# Patient Record
Sex: Male | Born: 1940 | Race: White | Hispanic: No | Smoking: Former smoker
Health system: Southern US, Community
[De-identification: ages and names within clinical notes are randomized; demographics above are authoritative.]

## PROBLEM LIST (undated history)

## (undated) ENCOUNTER — Emergency Department (HOSPITAL_BASED_OUTPATIENT_CLINIC_OR_DEPARTMENT_OTHER)

## (undated) DIAGNOSIS — R9389 Abnormal findings on diagnostic imaging of other specified body structures: Secondary | ICD-10-CM

## (undated) DIAGNOSIS — N289 Disorder of kidney and ureter, unspecified: Secondary | ICD-10-CM

## (undated) DIAGNOSIS — I10 Essential (primary) hypertension: Secondary | ICD-10-CM

## (undated) HISTORY — PX: OTHER SURGICAL HISTORY: SHX169

## (undated) HISTORY — DX: Abnormal findings on diagnostic imaging of other specified body structures: R93.89

---

## 2015-05-06 DIAGNOSIS — N1831 Chronic kidney disease, stage 3a: Secondary | ICD-10-CM | POA: Diagnosis present

## 2015-05-06 DIAGNOSIS — E782 Mixed hyperlipidemia: Secondary | ICD-10-CM | POA: Diagnosis present

## 2015-05-06 DIAGNOSIS — M1A9XX0 Chronic gout, unspecified, without tophus (tophi): Secondary | ICD-10-CM

## 2015-05-06 DIAGNOSIS — F5101 Primary insomnia: Secondary | ICD-10-CM | POA: Insufficient documentation

## 2015-05-06 DIAGNOSIS — I129 Hypertensive chronic kidney disease with stage 1 through stage 4 chronic kidney disease, or unspecified chronic kidney disease: Secondary | ICD-10-CM | POA: Diagnosis present

## 2015-05-06 HISTORY — DX: Chronic kidney disease, stage 3a: N18.31

## 2015-05-06 HISTORY — DX: Mixed hyperlipidemia: E78.2

## 2015-05-06 HISTORY — DX: Chronic gout, unspecified, without tophus (tophi): M1A.9XX0

## 2015-05-06 HISTORY — DX: Primary insomnia: F51.01

## 2015-05-10 DIAGNOSIS — E6609 Other obesity due to excess calories: Secondary | ICD-10-CM | POA: Insufficient documentation

## 2015-05-10 DIAGNOSIS — Z6831 Body mass index (BMI) 31.0-31.9, adult: Secondary | ICD-10-CM

## 2015-05-10 HISTORY — DX: Other obesity due to excess calories: Z68.31

## 2016-11-12 DIAGNOSIS — D508 Other iron deficiency anemias: Secondary | ICD-10-CM | POA: Insufficient documentation

## 2016-11-12 DIAGNOSIS — R7301 Impaired fasting glucose: Secondary | ICD-10-CM | POA: Insufficient documentation

## 2016-11-12 HISTORY — DX: Impaired fasting glucose: R73.01

## 2016-11-12 HISTORY — DX: Other iron deficiency anemias: D50.8

## 2019-05-14 DIAGNOSIS — M19011 Primary osteoarthritis, right shoulder: Secondary | ICD-10-CM | POA: Insufficient documentation

## 2019-05-14 DIAGNOSIS — M7541 Impingement syndrome of right shoulder: Secondary | ICD-10-CM | POA: Insufficient documentation

## 2019-05-14 HISTORY — DX: Impingement syndrome of right shoulder: M75.41

## 2019-05-14 HISTORY — DX: Primary osteoarthritis, right shoulder: M19.011

## 2019-05-29 DIAGNOSIS — M7591 Shoulder lesion, unspecified, right shoulder: Secondary | ICD-10-CM | POA: Insufficient documentation

## 2019-05-29 DIAGNOSIS — M67911 Unspecified disorder of synovium and tendon, right shoulder: Secondary | ICD-10-CM | POA: Insufficient documentation

## 2019-05-29 HISTORY — DX: Unspecified disorder of synovium and tendon, right shoulder: M67.911

## 2019-05-29 HISTORY — DX: Shoulder lesion, unspecified, right shoulder: M75.91

## 2020-03-11 ENCOUNTER — Emergency Department (HOSPITAL_COMMUNITY): Payer: Medicare HMO

## 2020-03-11 ENCOUNTER — Encounter (HOSPITAL_COMMUNITY): Payer: Self-pay

## 2020-03-11 ENCOUNTER — Emergency Department (HOSPITAL_COMMUNITY)
Admission: EM | Admit: 2020-03-11 | Discharge: 2020-03-11 | Disposition: A | Discharge status: Home / Self Care | Payer: Medicare HMO | Attending: Emergency Medicine | Admitting: Emergency Medicine

## 2020-03-11 ENCOUNTER — Other Ambulatory Visit: Payer: Self-pay

## 2020-03-11 DIAGNOSIS — K8051 Calculus of bile duct without cholangitis or cholecystitis with obstruction: Secondary | ICD-10-CM | POA: Insufficient documentation

## 2020-03-11 DIAGNOSIS — R1011 Right upper quadrant pain: Secondary | ICD-10-CM | POA: Insufficient documentation

## 2020-03-11 DIAGNOSIS — I10 Essential (primary) hypertension: Secondary | ICD-10-CM | POA: Diagnosis not present

## 2020-03-11 DIAGNOSIS — N289 Disorder of kidney and ureter, unspecified: Secondary | ICD-10-CM | POA: Diagnosis not present

## 2020-03-11 DIAGNOSIS — Z87891 Personal history of nicotine dependence: Secondary | ICD-10-CM | POA: Insufficient documentation

## 2020-03-11 DIAGNOSIS — K8080 Other cholelithiasis without obstruction: Secondary | ICD-10-CM | POA: Insufficient documentation

## 2020-03-11 DIAGNOSIS — K8050 Calculus of bile duct without cholangitis or cholecystitis without obstruction: Secondary | ICD-10-CM

## 2020-03-11 DIAGNOSIS — Z79899 Other long term (current) drug therapy: Secondary | ICD-10-CM | POA: Diagnosis not present

## 2020-03-11 DIAGNOSIS — R109 Unspecified abdominal pain: Secondary | ICD-10-CM

## 2020-03-11 HISTORY — DX: Disorder of kidney and ureter, unspecified: N28.9

## 2020-03-11 HISTORY — DX: Essential (primary) hypertension: I10

## 2020-03-11 LAB — COMPREHENSIVE METABOLIC PANEL
ALT: 23 U/L (ref 0–44)
AST: 40 U/L (ref 15–41)
Albumin: 4.5 g/dL (ref 3.5–5.0)
Alkaline Phosphatase: 61 U/L (ref 38–126)
Anion gap: 12 (ref 5–15)
BUN: 24 mg/dL — ABNORMAL HIGH (ref 8–23)
CO2: 23 mmol/L (ref 22–32)
Calcium: 9.5 mg/dL (ref 8.9–10.3)
Chloride: 94 mmol/L — ABNORMAL LOW (ref 98–111)
Creatinine, Ser: 1.23 mg/dL (ref 0.61–1.24)
GFR, Estimated: >60 mL/min (ref >60)
Glucose, Bld: 171 mg/dL — ABNORMAL HIGH (ref 70–99)
Potassium: 4.4 mmol/L (ref 3.5–5.1)
Sodium: 129 mmol/L — ABNORMAL LOW (ref 135–145)
Total Bilirubin: 1.1 mg/dL (ref 0.3–1.2)
Total Protein: 7 g/dL (ref 6.5–8.1)

## 2020-03-11 LAB — CBC
HCT: 35.8 % — ABNORMAL LOW (ref 39.0–52.0)
Hemoglobin: 12.6 g/dL — ABNORMAL LOW (ref 13.0–17.0)
MCH: 33.8 pg (ref 26.0–34.0)
MCHC: 35.2 g/dL (ref 30.0–36.0)
MCV: 96 fL (ref 80.0–100.0)
Platelets: 221 10*3/uL (ref 150–400)
RBC: 3.73 MIL/uL — ABNORMAL LOW (ref 4.22–5.81)
RDW: 13.2 % (ref 11.5–15.5)
WBC: 15.1 10*3/uL — ABNORMAL HIGH (ref 4.0–10.5)
nRBC: 0 % (ref 0.0–0.2)

## 2020-03-11 LAB — LIPASE, BLOOD: Lipase: 28 U/L (ref 11–51)

## 2020-03-11 MED ORDER — HYDROCODONE-ACETAMINOPHEN 5-325 MG PO TABS
1.0000 | ORAL_TABLET | Freq: Once | ORAL | Status: AC
  Administered 2020-03-11: 1 via ORAL
  Filled 2020-03-11: qty 1

## 2020-03-11 MED ORDER — MORPHINE SULFATE (PF) 4 MG/ML IV SOLN
4.0000 mg | Freq: Once | INTRAVENOUS | Status: AC
  Administered 2020-03-11: 4 mg via INTRAVENOUS
  Filled 2020-03-11: qty 1

## 2020-03-11 MED ORDER — ONDANSETRON HCL 4 MG/2ML IJ SOLN
4.0000 mg | Freq: Once | INTRAMUSCULAR | Status: AC
  Administered 2020-03-11: 4 mg via INTRAVENOUS
  Filled 2020-03-11: qty 2

## 2020-03-11 MED ORDER — IOHEXOL 300 MG/ML  SOLN
100.0000 mL | Freq: Once | INTRAMUSCULAR | Status: AC | PRN
  Administered 2020-03-11: 100 mL via INTRAVENOUS

## 2020-03-11 MED ORDER — FENTANYL CITRATE (PF) 100 MCG/2ML IJ SOLN
25.0000 ug | Freq: Once | INTRAMUSCULAR | Status: AC
  Administered 2020-03-11: 25 ug via INTRAVENOUS
  Filled 2020-03-11: qty 2

## 2020-03-11 MED ORDER — HYDROCODONE-ACETAMINOPHEN 5-325 MG PO TABS
1.0000 | ORAL_TABLET | Freq: Four times a day (QID) | ORAL | 0 refills | Status: DC | PRN
Start: 2020-03-11 — End: 2021-06-01

## 2020-03-11 MED ORDER — SODIUM CHLORIDE 0.9 % IV BOLUS
1000.0000 mL | Freq: Once | INTRAVENOUS | Status: AC
  Administered 2020-03-11: 1000 mL via INTRAVENOUS

## 2020-03-11 MED ORDER — ONDANSETRON 4 MG PO TBDP: 4.0000 mg | ORAL_TABLET | Freq: Three times a day (TID) | ORAL | 0 refills | Status: DC | PRN

## 2020-03-11 NOTE — ED Notes (Signed)
Patient off floor for Imaging.

## 2020-03-11 NOTE — ED Notes (Signed)
Patient back in room

## 2020-03-11 NOTE — ED Triage Notes (Signed)
Patient c/o right mid abdominal pain, emesis, and bloating since yesterday.

## 2020-03-11 NOTE — ED Notes (Signed)
Fall Risk Bundle: Fall Risk sign outside door Fall Risk armband Bed alarm on Yellow socks

## 2020-03-11 NOTE — ED Notes (Signed)
PO challenge complete, pt tolerated well.

## 2020-03-11 NOTE — ED Notes (Signed)
External condom catheter placed on patient  

## 2020-03-11 NOTE — ED Provider Notes (Signed)
[facility]-EMERGENCY DEPT Provider Note   CSN: [id number] Arrival date & time: 03/11/20  8768     History Chief Complaint  Patient presents with  . Abdominal Pain    Allen Torres is a 79 y.o. male past medical history of hypertension, renal disorder who presents for evaluation of right upper quadrant abdominal pain that began last night.  Reports that he ate dinner normally last night and felt okay.  He reports shortly afterwards, he started having some pain in his right upper quadrant.  He states that since then, has been persistent.  He states he had several episodes of nausea/vomiting and has not been able to tolerate any p.o. since onset of symptoms.  He had a small bowel movement last night.  None since.  He does not think he has been passing flatus.  He states that the pain was progressively getting worse, prompting ED visit.  He feels like his abdomen is bloated.  He has never had any previous abdominal surgeries.  Denies any history of fevers.  He states he does not have any chest pain, difficulty breathing, difficulty urinating.  The history is provided by the patient.       Past Medical History:  Diagnosis Date  . Hypertension   . Renal disorder    Patient states, "chronic kidney disease"    There are no problems to display for this patient.   Past Surgical History:  Procedure Laterality Date  . facial cystectomy         History reviewed. No pertinent family history.  Social History   Tobacco Use  . Smoking status: Former Games developer  . Smokeless tobacco: Never Used  Vaping Use  . Vaping Use: Never used  Substance Use Topics  . Alcohol use: Never  . Drug use: Never    Home Medications Prior to Admission medications   Medication Sig Start Date End Date Taking? Authorizing Provider  allopurinol (ZYLOPRIM) 100 MG tablet Take 200 mg by mouth daily. 02/09/20  Yes [provider]  atenolol (TENORMIN) 100 MG tablet Take 50-100 mg  by mouth 2 (two) times daily. Take 1 tablet (100 mg) in the morning and Take 1/2 tablet (50 mg) at bedtime 02/09/20  Yes [provider]  cholecalciferol (VITAMIN D3) 25 MCG (1000 UNIT) tablet Take 1,000 Units by mouth daily.   Yes [provider]  doxazosin (CARDURA) 2 MG tablet Take 2 mg by mouth every evening.  02/09/20  Yes [provider]  fluticasone (FLONASE) 50 MCG/ACT nasal spray Place 1 spray into both nostrils daily as needed for allergies.  02/09/20  Yes [provider]  hydrochlorothiazide (MICROZIDE) 12.5 MG capsule Take 12.5 mg by mouth daily. 02/09/20  Yes [provider]  lisinopril (ZESTRIL) 40 MG tablet Take 40 mg by mouth daily.  02/09/20  Yes [provider]  vitamin B-12 (CYANOCOBALAMIN) 100 MCG tablet Take 100 mcg by mouth daily.   Yes [provider]  HYDROcodone-acetaminophen (NORCO/VICODIN) 5-325 MG tablet Take 1-2 tablets by mouth every 6 (six) hours as needed. 03/11/20   Maxwell Caul, PA-C  ondansetron (ZOFRAN ODT) 4 MG disintegrating tablet Take 1 tablet (4 mg total) by mouth every 8 (eight) hours as needed for nausea or vomiting. 03/11/20   Maxwell Caul, PA-C    Allergies    Indocin [indomethacin]  Review of Systems   Review of Systems  Constitutional: Negative for fever.  Respiratory: Negative for cough and shortness of breath.  Cardiovascular: Negative for chest pain.  Gastrointestinal: Positive for abdominal pain, nausea and vomiting.  Genitourinary: Negative for dysuria and hematuria.  Neurological: Negative for headaches.  All other systems reviewed and are negative.   Physical Exam Updated Vital Signs BP (!) 146/71 (BP Location: Right Arm)   Pulse 70   Temp 98.4 F (36.9 C) (Oral)   Resp (!) 26   Ht 5\' 9"  (1.753 m)   Wt 100.2 kg   SpO2 94%   BMI 32.64 kg/m   Physical Exam Vitals and nursing note reviewed.  Constitutional:      Appearance: Normal appearance. He is  well-developed.     Comments: Appears uncomfortable but NAD  HENT:     Head: Normocephalic and atraumatic.  Eyes:     General: Lids are normal.     Conjunctiva/sclera: Conjunctivae normal.     Pupils: Pupils are equal, round, and reactive to light.  Cardiovascular:     Rate and Rhythm: Normal rate and regular rhythm.     Pulses: Normal pulses.     Heart sounds: Normal heart sounds. No murmur heard.  No friction rub. No gallop.   Pulmonary:     Effort: Pulmonary effort is normal.     Breath sounds: Normal breath sounds.     Comments: Lungs clear to auscultation bilaterally.  Symmetric chest rise.  No wheezing, rales, rhonchi. Abdominal:     Palpations: Abdomen is soft. Abdomen is not rigid.     Tenderness: There is abdominal tenderness in the right upper quadrant. There is no guarding. Positive signs include Murphy's sign.     Comments: Protuberant abdomen.  He has tenderness palpation of the right upper quadrant.  Positive Murphy sign.  No rigidity, guarding.  Musculoskeletal:        General: Normal range of motion.     Cervical back: Full passive range of motion without pain.  Skin:    General: Skin is warm and dry.     Capillary Refill: Capillary refill takes less than 2 seconds.  Neurological:     Mental Status: He is alert and oriented to person, place, and time.  Psychiatric:        Speech: Speech normal.     ED Results / Procedures / Treatments   Labs (all labs ordered are listed, but only abnormal results are displayed) Labs Reviewed  COMPREHENSIVE METABOLIC PANEL - Abnormal; Notable for the following components:      Result Value   Sodium 129 (*)    Chloride 94 (*)    Glucose, Bld 171 (*)    BUN 24 (*)    All other components within normal limits  CBC - Abnormal; Notable for the following components:   WBC 15.1 (*)    RBC 3.73 (*)    Hemoglobin 12.6 (*)    HCT 35.8 (*)    All other components within normal limits  LIPASE, BLOOD  URINALYSIS, ROUTINE W REFLEX  MICROSCOPIC    EKG None  Radiology CT ABDOMEN PELVIS W CONTRAST  Result Date: 03/11/2020 CLINICAL DATA:  Right-sided mid abdominal pain with emesis and bloating since yesterday. EXAM: CT ABDOMEN AND PELVIS WITH CONTRAST TECHNIQUE: Multidetector CT imaging of the abdomen and pelvis was performed using the standard protocol following bolus administration of intravenous contrast. CONTRAST:  100mL OMNIPAQUE IOHEXOL 300 MG/ML  SOLN COMPARISON:  Right upper quadrant ultrasound from same day. FINDINGS: Lower chest: Circumferential wall thickening of the distal esophagus. Subsegmental atelectasis at the lung bases. Hepatobiliary: No  focal liver abnormality. Tiny layering stones and/or sludge in the gallbladder. No gallbladder wall thickening or biliary dilatation. Pancreas: Unremarkable. No pancreatic ductal dilatation or surrounding inflammatory changes. Spleen: Normal in size without focal abnormality. Adrenals/Urinary Tract: Indeterminate 1.1 cm right adrenal nodule. The left adrenal gland is unremarkable. Bilateral renal simple cysts. No renal calculi or hydronephrosis. The bladder is unremarkable. Stomach/Bowel: Small hiatal hernia. Mild wall thickening of the gastric antrum and first portion of the duodenum (series 2, image 27). No obstruction. Sigmoid colonic diverticulosis. Normal appendix. Vascular/Lymphatic: Aortic atherosclerosis. No enlarged abdominal or pelvic lymph nodes. Reproductive: Prostate is normal in size with central calcifications. Other: No free fluid or pneumoperitoneum. Moderate left and small right fat containing inguinal hernias. Small fat containing umbilical hernia. Musculoskeletal: No acute or significant osseous findings. IMPRESSION: 1. Mild wall thickening of the gastric antrum and first portion of the duodenum may reflect gastroduodenitis. 2. Circumferential wall thickening of the distal esophagus amy reflect esophagitis. 3. Cholelithiasis and/or sludge without evidence of acute  cholecystitis. 4. Indeterminate 1.1 cm right adrenal nodule. 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 Consider 12 month follow-up adrenal protocol CT with and without contrast. This recommendation follows ACR consensus guidelines: Management of Incidental Adrenal Masses: A White Paper of the ACR Incidental Findings Committee. J Am Coll Radiol 2017;14:1038-1044. 5. Aortic Atherosclerosis (ICD10-I70.0). Electronically Signed   By: Obie Dredge M.D.   On: 03/11/2020 15:14   US Abdomen Limited RUQ (LIVER/GB)  Result Date: 03/11/2020 CLINICAL DATA:  Abdominal pain EXAM: ULTRASOUND ABDOMEN LIMITED RIGHT UPPER QUADRANT COMPARISON:  None. FINDINGS: Gallbladder: Multiple mobile layering tiny stones or sludge within the gallbladder lumen. No pericholecystic edema or wall thickening visualized. No sonographic Murphy sign noted by sonographer. Common bile duct: Diameter: 4 mm. Liver: Limited evaluation as the left hepatic lobe was obscured by overlying bowel gas. Within this limitation, no focal lesion identified. Within normal limits in parenchymal echogenicity. Portal vein is patent on color Doppler imaging with normal direction of blood flow towards the liver. Other: None. IMPRESSION: 1. Cholelithiasis without sonographic evidence of acute cholecystitis. 2. Evaluation the liver is limited by overlying bowel gas. No definite sonographic abnormality. Electronically Signed   By: Duanne Guess D.O.   On: 03/11/2020 13:09    Procedures Procedures (including critical care time)  Medications Ordered in ED Medications  sodium chloride 0.9 % bolus 1,000 mL (0 mLs Intravenous Stopped 03/11/20 1654)  ondansetron (ZOFRAN) injection 4 mg (4 mg Intravenous Given 03/11/20 1210)  morphine 4 MG/ML injection 4 mg (4 mg Intravenous Given 03/11/20 1212)  fentaNYL (SUBLIMAZE) injection 25 mcg (25 mcg Intravenous Given 03/11/20 1508)  ondansetron (ZOFRAN) injection 4 mg (4 mg Intravenous Given  03/11/20 1508)  iohexol (OMNIPAQUE) 300 MG/ML solution 100 mL (100 mLs Intravenous Contrast Given 03/11/20 1436)  HYDROcodone-acetaminophen (NORCO/VICODIN) 5-325 MG per tablet 1 tablet (1 tablet Oral Given 03/11/20 1651)    ED Course  I have reviewed the triage vital signs and the nursing notes.  Pertinent labs & imaging results that were available during my care of the patient were reviewed by me and considered in my medical decision making (see chart for details).    MDM Rules/Calculators/A&P                           79 year old male who presents for evaluation of right upper quadrant pain that began last night.  Associate with nausea/vomiting.  On initially arrival,  he is afebrile, nontoxic-appearing.  Vital signs are stable.  On exam, he has tenderness palpation noted to the right upper quadrant.  Concern for hepatobiliary etiology.  We will plan to check labs, right upper quadrant ultrasound.  CBC shows leukocytosis of 15.1.  Hemoglobin is 12.6.  CMP shows sodium 129.  BUN is 24, creatinine is 1.23.  Ultrasound shows cholelithiasis.  No evidence of acute cholecystitis.  Reevaluation.  Patient reports pain is improved.  He still tender.  He is concerned because he feels like his abdomen is bloated.  Daughter is at bedside who agrees.  He tells me he has not had any bowel movement since last night and does not think he has been passing gas.  CT scan shows mild wall thickening of the gastric antrum and first portion of the duodenum.  He has circumferential wall thickening of the distal esophagus that could reflect esophagitis.  Cholelithiasis without evidence of acute cholecystitis.  He also has an indeterminate 1.1 centimeter right adrenal nodule.  Reevaluation after pain medication.  Patient reports improvement in pain.  We will plan to p.o. challenge.  Patient able to tolerate p.o. without any difficulty.  Repeat abdominal exam is improved.  At this time, patient is hemodynamically  stable with no fever, signs of infectious etiology on his ultrasound.  At this time, I feel that patient is a candidate for outpatient follow-up with general surgery.  I discussed with both patient and daughter regarding his findings.  We discussed pain control with outpatient pain medication as well as diet changes. At this time, patient exhibits no emergent life-threatening condition that require further evaluation in ED. Patient had ample opportunity for questions and discussion. All patient's questions were answered with full understanding. Strict return precautions discussed. Patient expresses understanding and agreement to plan.   UPDATE 03/12/20 4:09 PM: I called patient's daughter Jamesetta Geralds) and updated her on the adrenal gland findings on CT scan.   Portions of this note were generated with Scientist, clinical (histocompatibility and immunogenetics). Dictation errors may occur despite best attempts at proofreading.   Final Clinical Impression(s) / ED Diagnoses Final diagnoses:  Abdominal pain  Biliary calculus of other site without obstruction  Biliary colic    Rx / DC Orders ED Discharge Orders         Ordered    ondansetron (ZOFRAN ODT) 4 MG disintegrating tablet  Every 8 hours PRN        03/11/20 1640    HYDROcodone-acetaminophen (NORCO/VICODIN) 5-325 MG tablet  Every 6 hours PRN        03/11/20 1640           Maxwell Caul, PA-C 03/12/20 1610    Derwood Kaplan, MD 03/13/20 [phone]

## 2020-03-11 NOTE — Discharge Instructions (Signed)
As we discussed, your work-up today showed evidence of gallstones but no signs of surrounding infection.  As we discussed, things like biliary colic can contribute to your pain.  This happens when you eat and your gallbladder has bile that causes the gallstone to get up against opening.  This can cause pain.  You can help control this by dietary changes, including limiting your amount of spicy, greasy, oily food.  We will give you nausea and pain medication for acute or breakthrough pain.  We have referred you to outpatient surgery that he can follow-up with for further evaluation of your gallstones.  Return to the emergency department for any worsening pain, fevers, vomiting.

## 2020-03-14 ENCOUNTER — Other Ambulatory Visit: Payer: Self-pay

## 2020-03-14 ENCOUNTER — Inpatient Hospital Stay (HOSPITAL_COMMUNITY)
Admission: EM | Admit: 2020-03-14 | Discharge: 2020-03-19 | DRG: 418 | Disposition: A | Discharge status: Home / Self Care | Payer: Medicare HMO | Attending: Internal Medicine | Admitting: Internal Medicine

## 2020-03-14 ENCOUNTER — Encounter (HOSPITAL_COMMUNITY): Payer: Self-pay

## 2020-03-14 DIAGNOSIS — Z6832 Body mass index (BMI) 32.0-32.9, adult: Secondary | ICD-10-CM

## 2020-03-14 DIAGNOSIS — R935 Abnormal findings on diagnostic imaging of other abdominal regions, including retroperitoneum: Secondary | ICD-10-CM

## 2020-03-14 DIAGNOSIS — K2990 Gastroduodenitis, unspecified, without bleeding: Secondary | ICD-10-CM | POA: Diagnosis present

## 2020-03-14 DIAGNOSIS — I10 Essential (primary) hypertension: Secondary | ICD-10-CM | POA: Diagnosis present

## 2020-03-14 DIAGNOSIS — D509 Iron deficiency anemia, unspecified: Secondary | ICD-10-CM | POA: Diagnosis present

## 2020-03-14 DIAGNOSIS — K8020 Calculus of gallbladder without cholecystitis without obstruction: Secondary | ICD-10-CM | POA: Diagnosis not present

## 2020-03-14 DIAGNOSIS — R509 Fever, unspecified: Secondary | ICD-10-CM

## 2020-03-14 DIAGNOSIS — R933 Abnormal findings on diagnostic imaging of other parts of digestive tract: Secondary | ICD-10-CM | POA: Diagnosis not present

## 2020-03-14 DIAGNOSIS — R1010 Upper abdominal pain, unspecified: Secondary | ICD-10-CM | POA: Diagnosis not present

## 2020-03-14 DIAGNOSIS — N179 Acute kidney failure, unspecified: Secondary | ICD-10-CM | POA: Diagnosis present

## 2020-03-14 DIAGNOSIS — K2980 Duodenitis without bleeding: Secondary | ICD-10-CM

## 2020-03-14 DIAGNOSIS — E871 Hypo-osmolality and hyponatremia: Secondary | ICD-10-CM

## 2020-03-14 DIAGNOSIS — R109 Unspecified abdominal pain: Secondary | ICD-10-CM

## 2020-03-14 DIAGNOSIS — K811 Chronic cholecystitis: Secondary | ICD-10-CM

## 2020-03-14 DIAGNOSIS — Z87891 Personal history of nicotine dependence: Secondary | ICD-10-CM

## 2020-03-14 DIAGNOSIS — K8012 Calculus of gallbladder with acute and chronic cholecystitis without obstruction: Principal | ICD-10-CM | POA: Diagnosis present

## 2020-03-14 DIAGNOSIS — E440 Moderate protein-calorie malnutrition: Secondary | ICD-10-CM | POA: Diagnosis present

## 2020-03-14 DIAGNOSIS — E872 Acidosis: Secondary | ICD-10-CM | POA: Diagnosis present

## 2020-03-14 DIAGNOSIS — R0981 Nasal congestion: Secondary | ICD-10-CM | POA: Diagnosis not present

## 2020-03-14 DIAGNOSIS — Z888 Allergy status to other drugs, medicaments and biological substances status: Secondary | ICD-10-CM

## 2020-03-14 DIAGNOSIS — K5900 Constipation, unspecified: Secondary | ICD-10-CM | POA: Diagnosis present

## 2020-03-14 DIAGNOSIS — K279 Peptic ulcer, site unspecified, unspecified as acute or chronic, without hemorrhage or perforation: Secondary | ICD-10-CM

## 2020-03-14 DIAGNOSIS — Z79899 Other long term (current) drug therapy: Secondary | ICD-10-CM

## 2020-03-14 DIAGNOSIS — R9389 Abnormal findings on diagnostic imaging of other specified body structures: Secondary | ICD-10-CM

## 2020-03-14 DIAGNOSIS — K3189 Other diseases of stomach and duodenum: Secondary | ICD-10-CM | POA: Diagnosis present

## 2020-03-14 DIAGNOSIS — R1013 Epigastric pain: Secondary | ICD-10-CM | POA: Diagnosis not present

## 2020-03-14 DIAGNOSIS — E861 Hypovolemia: Secondary | ICD-10-CM | POA: Diagnosis not present

## 2020-03-14 DIAGNOSIS — K209 Esophagitis, unspecified without bleeding: Secondary | ICD-10-CM | POA: Diagnosis present

## 2020-03-14 DIAGNOSIS — M109 Gout, unspecified: Secondary | ICD-10-CM | POA: Diagnosis present

## 2020-03-14 DIAGNOSIS — K224 Dyskinesia of esophagus: Secondary | ICD-10-CM | POA: Diagnosis present

## 2020-03-14 DIAGNOSIS — Z20822 Contact with and (suspected) exposure to covid-19: Secondary | ICD-10-CM | POA: Diagnosis present

## 2020-03-14 DIAGNOSIS — E669 Obesity, unspecified: Secondary | ICD-10-CM | POA: Diagnosis present

## 2020-03-14 HISTORY — DX: Essential (primary) hypertension: I10

## 2020-03-14 HISTORY — DX: Hypo-osmolality and hyponatremia: E87.1

## 2020-03-14 LAB — COMPREHENSIVE METABOLIC PANEL
ALT: 20 U/L (ref 0–44)
AST: 27 U/L (ref 15–41)
Albumin: 3.8 g/dL (ref 3.5–5.0)
Alkaline Phosphatase: 68 U/L (ref 38–126)
Anion gap: 12 (ref 5–15)
BUN: 35 mg/dL — ABNORMAL HIGH (ref 8–23)
CO2: 23 mmol/L (ref 22–32)
Calcium: 8.4 mg/dL — ABNORMAL LOW (ref 8.9–10.3)
Chloride: 92 mmol/L — ABNORMAL LOW (ref 98–111)
Creatinine, Ser: 1.74 mg/dL — ABNORMAL HIGH (ref 0.61–1.24)
GFR, Estimated: 40 mL/min — ABNORMAL LOW (ref >60)
Glucose, Bld: 178 mg/dL — ABNORMAL HIGH (ref 70–99)
Potassium: 3.6 mmol/L (ref 3.5–5.1)
Sodium: 127 mmol/L — ABNORMAL LOW (ref 135–145)
Total Bilirubin: 1.2 mg/dL (ref 0.3–1.2)
Total Protein: 6.5 g/dL (ref 6.5–8.1)

## 2020-03-14 LAB — IRON AND TIBC
Iron: 19 ug/dL — ABNORMAL LOW (ref 45–182)
Saturation Ratios: 8 % — ABNORMAL LOW (ref 17.9–39.5)
TIBC: 241 ug/dL — ABNORMAL LOW (ref 250–450)
UIBC: 222 ug/dL

## 2020-03-14 LAB — CBC
HCT: 31.3 % — ABNORMAL LOW (ref 39.0–52.0)
Hemoglobin: 11 g/dL — ABNORMAL LOW (ref 13.0–17.0)
MCH: 33.6 pg (ref 26.0–34.0)
MCHC: 35.1 g/dL (ref 30.0–36.0)
MCV: 95.7 fL (ref 80.0–100.0)
Platelets: 199 10*3/uL (ref 150–400)
RBC: 3.27 MIL/uL — ABNORMAL LOW (ref 4.22–5.81)
RDW: 13.5 % (ref 11.5–15.5)
WBC: 10 10*3/uL (ref 4.0–10.5)
nRBC: 0 % (ref 0.0–0.2)

## 2020-03-14 LAB — RESP PANEL BY RT-PCR (FLU A&B, COVID) ARPGX2
Influenza A by PCR: NEGATIVE
Influenza B by PCR: NEGATIVE
SARS Coronavirus 2 by RT PCR: NEGATIVE

## 2020-03-14 LAB — FERRITIN: Ferritin: 489 ng/mL — ABNORMAL HIGH (ref 24–336)

## 2020-03-14 LAB — LIPASE, BLOOD: Lipase: 27 U/L (ref 11–51)

## 2020-03-14 MED ORDER — ALLOPURINOL 100 MG PO TABS
200.0000 mg | ORAL_TABLET | Freq: Every day | ORAL | Status: DC
  Administered 2020-03-15 – 2020-03-19 (×4): 200 mg via ORAL
  Filled 2020-03-14 (×5): qty 2

## 2020-03-14 MED ORDER — POLYETHYLENE GLYCOL 3350 17 G PO PACK: 17.0000 g | PACK | Freq: Every day | ORAL | Status: DC | PRN

## 2020-03-14 MED ORDER — ATENOLOL 50 MG PO TABS
100.0000 mg | ORAL_TABLET | Freq: Every day | ORAL | Status: DC
  Administered 2020-03-15 – 2020-03-18 (×3): 100 mg via ORAL
  Filled 2020-03-14 (×3): qty 2

## 2020-03-14 MED ORDER — ENOXAPARIN SODIUM 40 MG/0.4ML ~~LOC~~ SOLN
40.0000 mg | SUBCUTANEOUS | Status: AC
  Administered 2020-03-14 – 2020-03-15 (×2): 40 mg via SUBCUTANEOUS
  Filled 2020-03-14 (×2): qty 0.4

## 2020-03-14 MED ORDER — SODIUM CHLORIDE 0.9 % IV SOLN: INTRAVENOUS | Status: AC

## 2020-03-14 MED ORDER — ATENOLOL 50 MG PO TABS: 50.0000 mg | ORAL_TABLET | Freq: Two times a day (BID) | ORAL | Status: DC

## 2020-03-14 MED ORDER — SODIUM CHLORIDE 0.9 % IV BOLUS
1000.0000 mL | Freq: Once | INTRAVENOUS | Status: AC
  Administered 2020-03-14: 1000 mL via INTRAVENOUS

## 2020-03-14 MED ORDER — ACETAMINOPHEN 500 MG PO TABS
1000.0000 mg | ORAL_TABLET | Freq: Four times a day (QID) | ORAL | Status: DC | PRN
  Administered 2020-03-15 – 2020-03-18 (×2): 1000 mg via ORAL
  Filled 2020-03-14 (×2): qty 2

## 2020-03-14 MED ORDER — DOXAZOSIN MESYLATE 2 MG PO TABS
2.0000 mg | ORAL_TABLET | Freq: Every evening | ORAL | Status: DC
  Administered 2020-03-14 – 2020-03-18 (×4): 2 mg via ORAL
  Filled 2020-03-14 (×6): qty 1

## 2020-03-14 MED ORDER — ONDANSETRON HCL 4 MG/2ML IJ SOLN: 4.0000 mg | Freq: Four times a day (QID) | INTRAMUSCULAR | Status: DC | PRN

## 2020-03-14 MED ORDER — PANTOPRAZOLE SODIUM 40 MG IV SOLR
40.0000 mg | INTRAVENOUS | Status: DC
  Administered 2020-03-15: 40 mg via INTRAVENOUS
  Filled 2020-03-14 (×2): qty 40

## 2020-03-14 MED ORDER — PANTOPRAZOLE SODIUM 40 MG IV SOLR
40.0000 mg | Freq: Once | INTRAVENOUS | Status: DC
  Administered 2020-03-14: 40 mg via INTRAVENOUS

## 2020-03-14 MED ORDER — MORPHINE SULFATE (PF) 2 MG/ML IV SOLN: 2.0000 mg | INTRAVENOUS | Status: DC | PRN

## 2020-03-14 MED ORDER — ATENOLOL 50 MG PO TABS
50.0000 mg | ORAL_TABLET | Freq: Every day | ORAL | Status: DC
  Administered 2020-03-14 – 2020-03-18 (×5): 50 mg via ORAL
  Filled 2020-03-14 (×5): qty 1

## 2020-03-14 MED ORDER — ONDANSETRON HCL 4 MG PO TABS: 4.0000 mg | ORAL_TABLET | Freq: Four times a day (QID) | ORAL | Status: DC | PRN

## 2020-03-14 NOTE — ED Notes (Signed)
Pt aware urine sample is needed, provided with urinal.

## 2020-03-14 NOTE — Consult Note (Signed)
Referring Provider:  [facility]         Primary Care Physician:  Patient, No Pcp Per Primary Gastroenterologist: unassigned            We were asked to see this patient for:    Abdominal pain              ASSESSMENT / PLAN:   # 79 year old male with right-sided abdominal pain x 4 days (started in the RUQ now involving the whole right abdomen).  Evaluated in ED 3 days ago, WBC 15K.  CTAP suggested distal esophageal wall thickening,  mild wall thickening of the stomach and first portion of the duodenum, cholelithiasis without evidence for acute cholecystitis.  Pain improved, discharged home from ED, back today with ongoing right sided abdominal pain.  Liver chemistries have remained normal. Today WBC is normal --Though abdominal pain seems out of proportion to the degree of gastroduodenitis, patient will still likely need upper endoscopy to start.  If negative, consider further workup for gallbladder disease. Timing of EGD not yet known. He can certainly have clear liquids as tolerated today --Agree with QD PPI for now  # Distal esophageal wall thickening on CT scan. Probably has esophagitis. No dysphagia. Further evaluation at time of EGD  # Chronic normocytic anemia.. Hgb averaging 11.8 to 12.8 over last couple of years at [place]. Hgb 12.6 in ED 3 days ago, currently at 39. No overt GI bleeding --MCV 95. By [facility] labs in August patient considered macrocytic. B12 and folate at that time were normal.  --Obtain iron studies.   # AKI. Creatinine 1.23 >> 1.74     HPI:                                                                                                                             Chief Complaint: right sided abdominal pain  Allen Torres is a 79 y.o. male with a pmh significant for, not necessarily limited to:  HTN, CKD, hyperlipidemia, gout  Patient was seen in the ED 3 days ago for evaluation of RUQ pain.  WBC was elevated at 15.  Hemoglobin 12.6, sodium 129.   Creatinine 1.23.  Lipase and liver chemistries were normal.  RUQ ultrasound remarkable for cholelithiasis without evidence of acute cholecystitis.  Murphy sign noted by sonographer.  CBD 4 mm.  CT AP with contrast notable for circumferential wall thickening of the distal esophagus, mild wall thickening of the gastric antrum and first portion of the duodenum , diverticulosis tiny layering stones/sludge in the gallbladder, 1.1 cm right adrenal nodule, moderate left and small right fat-containing inguinal hernias.  Patient's pain improved he was discharged home with Zofran and hydrocodone.  Patient returned to the ED today with ongoing abdominal pain.  Patient's daughter is in the room and she helps with the history.  The pain started in his RUQ on Thanksgiving day.  The pain felt like something was twisting him  inside, it did not radiate through to the back but did eventually began radiating downward into his RLQ.  He has not noticed if the pain gets worse with eating but his p.o. intake has been very limited over the last few days due to diminished appetite Patient says that he has had generalized lower abdominal pain for years which has always been brushed off by providers.  This pain is different and that it started in his RUQ.  He has had loose stool today but otherwise no bowel changes.  He has occasional constipation treated with cascara. He rarely has acid reflux symptoms.  In the ED today his WBC is normal at 10, hemoglobin is 11, platelets 199, lipase and LFTs still normal.  Creatinine elevated at 1.74   Data Reviewed:  Care Everywhere  August 2021. WBC 4.8, hgb 11.8, MCV 98.8, folate normal. B12 292  PREVIOUS ENDOSCOPIC EVALUATIONS / PERTINENT STUDIES   Appears [facility] Results not visible     Past Medical History:  Diagnosis Date  . Hypertension   . Renal disorder    Patient states, "chronic kidney disease"    Past Surgical History:  Procedure  Laterality Date  . facial cystectomy      Prior to Admission medications   Medication Sig Start Date End Date Taking? Authorizing Provider  acetaminophen (TYLENOL) 500 MG tablet Take 1,000 mg by mouth every 6 (six) hours as needed for moderate pain.   Yes [provider]  allopurinol (ZYLOPRIM) 100 MG tablet Take 200 mg by mouth daily. 02/09/20  Yes [provider]  Ascorbic Acid (VITAMIN C) 1000 MG tablet Take 1,000 mg by mouth daily.   Yes [provider]  atenolol (TENORMIN) 100 MG tablet Take 50-100 mg by mouth 2 (two) times daily. Take 1 tablet (100 mg) in the morning and 1/2 tablet (50 mg) at bedtime 02/09/20  Yes [provider]  cholecalciferol (VITAMIN D3) 25 MCG (1000 UNIT) tablet Take 1,000 Units by mouth daily.   Yes [provider]  doxazosin (CARDURA) 2 MG tablet Take 2 mg by mouth every evening.  02/09/20  Yes [provider]  fluticasone (FLONASE) 50 MCG/ACT nasal spray Place 1 spray into both nostrils daily as needed for allergies.  02/09/20  Yes [provider]  hydrochlorothiazide (MICROZIDE) 12.5 MG capsule Take 12.5 mg by mouth daily. 02/09/20  Yes [provider]  HYDROcodone-acetaminophen (NORCO/VICODIN) 5-325 MG tablet Take 1-2 tablets by mouth every 6 (six) hours as needed. Patient taking differently: Take 1-2 tablets by mouth every 6 (six) hours as needed for moderate pain.  03/11/20  Yes Graciella FreerLayden, Lindsey A, PA-C  lisinopril (ZESTRIL) 40 MG tablet Take 40 mg by mouth daily.  02/09/20  Yes [provider]  magnesium 30 MG tablet Take 30 mg by mouth daily.    Yes [provider]  vitamin B-12 (CYANOCOBALAMIN) 100 MCG tablet Take 100 mcg by mouth daily.   Yes [provider]  zinc gluconate 50 MG tablet Take 50 mg by mouth daily.   Yes [provider]  ondansetron (ZOFRAN ODT) 4 MG disintegrating tablet Take 1 tablet (4 mg total) by mouth every 8 (eight) hours as  needed for nausea or vomiting. 03/11/20   Maxwell CaulLayden, Lindsey A, PA-C    Current Facility-Administered Medications  Medication Dose Route Frequency Provider Last Rate Last Admin  . pantoprazole (PROTONIX) injection 40 mg  40 mg Intravenous Once Derwood KaplanNanavati, Ankit, MD  Current Outpatient Medications  Medication Sig Dispense Refill  . acetaminophen (TYLENOL) 500 MG tablet Take 1,000 mg by mouth every 6 (six) hours as needed for moderate pain.    Marland Kitchen allopurinol (ZYLOPRIM) 100 MG tablet Take 200 mg by mouth daily.    . Ascorbic Acid (VITAMIN C) 1000 MG tablet Take 1,000 mg by mouth daily.    Marland Kitchen atenolol (TENORMIN) 100 MG tablet Take 50-100 mg by mouth 2 (two) times daily. Take 1 tablet (100 mg) in the morning and 1/2 tablet (50 mg) at bedtime    . cholecalciferol (VITAMIN D3) 25 MCG (1000 UNIT) tablet Take 1,000 Units by mouth daily.    Marland Kitchen doxazosin (CARDURA) 2 MG tablet Take 2 mg by mouth every evening.     . fluticasone (FLONASE) 50 MCG/ACT nasal spray Place 1 spray into both nostrils daily as needed for allergies.     . hydrochlorothiazide (MICROZIDE) 12.5 MG capsule Take 12.5 mg by mouth daily.    Marland Kitchen HYDROcodone-acetaminophen (NORCO/VICODIN) 5-325 MG tablet Take 1-2 tablets by mouth every 6 (six) hours as needed. (Patient taking differently: Take 1-2 tablets by mouth every 6 (six) hours as needed for moderate pain. ) 10 tablet 0  . lisinopril (ZESTRIL) 40 MG tablet Take 40 mg by mouth daily.     . magnesium 30 MG tablet Take 30 mg by mouth daily.     . vitamin B-12 (CYANOCOBALAMIN) 100 MCG tablet Take 100 mcg by mouth daily.    Marland Kitchen zinc gluconate 50 MG tablet Take 50 mg by mouth daily.    . ondansetron (ZOFRAN ODT) 4 MG disintegrating tablet Take 1 tablet (4 mg total) by mouth every 8 (eight) hours as needed for nausea or vomiting. 6 tablet 0    Allergies as of 03/14/2020 - Review Complete 03/14/2020  Allergen Reaction Noted  . Colchicine Diarrhea 02/17/2019  . Indocin [indomethacin]  03/11/2020   . Statins Other (See Comments) 05/06/2015    History reviewed. No pertinent family history.  Social History   Socioeconomic History  . Marital status: Widowed    Spouse name: Not on file  . Number of children: Not on file  . Years of education: Not on file  . Highest education level: Not on file  Occupational History  . Not on file  Tobacco Use  . Smoking status: Former Games developer  . Smokeless tobacco: Never Used  Vaping Use  . Vaping Use: Never used  Substance and Sexual Activity  . Alcohol use: Never  . Drug use: Never  . Sexual activity: Not on file  Other Topics Concern  . Not on file  Social History Narrative  . Not on file   Social Determinants of Health   Financial Resource Strain:   . Difficulty of Paying Living Expenses: Not on file  Food Insecurity:   . Worried About Programme researcher, broadcasting/film/video in the Last Year: Not on file  . Ran Out of Food in the Last Year: Not on file  Transportation Needs:   . Lack of Transportation (Medical): Not on file  . Lack of Transportation (Non-Medical): Not on file  Physical Activity:   . Days of Exercise per Week: Not on file  . Minutes of Exercise per Session: Not on file  Stress:   . Feeling of Stress : Not on file  Social Connections:   . Frequency of Communication with Friends and Family: Not on file  . Frequency of Social Gatherings with Friends and Family: Not on file  .  Attends Religious Services: Not on file  . Active Member of Clubs or Organizations: Not on file  . Attends Banker Meetings: Not on file  . Marital Status: Not on file  Intimate Partner Violence:   . Fear of Current or Ex-Partner: Not on file  . Emotionally Abused: Not on file  . Physically Abused: Not on file  . Sexually Abused: Not on file    Review of Systems: All systems reviewed and negative except where noted in HPI.  OBJECTIVE:    Physical Exam: Vital signs in last 24 hours: Temp:  [98.3 F (36.8 C)] 98.3 F (36.8 C) (11/29  1114) Pulse Rate:  [69-75] 72 (11/29 1445) Resp:  [18-20] 18 (11/29 1445) BP: (138-161)/(56-78) 146/56 (11/29 1445) SpO2:  [91 %-100 %] 97 % (11/29 1445) Weight:  [100.2 kg] 100.2 kg (11/29 1115)   General:   Alert, well-developed,  male in NAD Psych:  Cooperative. Slightly irritated. Eyes:  Pupils equal, sclera clear, no icterus.   Conjunctiva pink. Ears:  Diminished hearing. Nose:  No deformity, discharge,  or lesions. Neck:  Supple; no masses Lungs:  Clear throughout to auscultation.   No wheezes, crackles, or rhonchi.  Heart:  Regular rate and rhythm; no murmurs, no lower extremity edema Abdomen:  Soft, protuberant, mild RUQ tenderness.  BS active, no palp mass   Rectal:  Deferred  Msk:  Symmetrical without gross deformities. . Neurologic:  Alert and  oriented x4;  grossly normal neurologically. Skin:  Intact without significant lesions or rashes.  Filed Weights   03/14/20 1115  Weight: 100.2 kg     Scheduled inpatient medications . pantoprazole (PROTONIX) IV  40 mg Intravenous Once      Intake/Output from previous day: No intake/output data recorded. Intake/Output this shift: No intake/output data recorded.   Lab Results: Recent Labs    03/14/20 1156  WBC 10.0  HGB 11.0*  HCT 31.3*  PLT 199   BMET Recent Labs    03/14/20 1156  NA 127*  K 3.6  CL 92*  CO2 23  GLUCOSE 178*  BUN 35*  CREATININE 1.74*  CALCIUM 8.4*   LFT Recent Labs    03/14/20 1156  PROT 6.5  ALBUMIN 3.8  AST 27  ALT 20  ALKPHOS 68  BILITOT 1.2   PT/INR No results for input(s): LABPROT, INR in the last 72 hours. Hepatitis Panel No results for input(s): HEPBSAG, HCVAB, HEPAIGM, HEPBIGM in the last 72 hours.   . CBC Latest Ref Rng & Units 03/14/2020 03/11/2020  WBC 4.0 - 10.5 K/uL 10.0 15.1(H)  Hemoglobin 13.0 - 17.0 g/dL 11.0(L) 12.6(L)  Hematocrit 39 - 52 % 31.3(L) 35.8(L)  Platelets 150 - 400 K/uL 199 221    . CMP Latest Ref Rng & Units 03/14/2020 03/11/2020   Glucose 70 - 99 mg/dL 161(W) 960(A)  BUN 8 - 23 mg/dL 54(U) 98(J)  Creatinine 0.61 - 1.24 mg/dL 1.91(Y) 7.82  Sodium 956 - 145 mmol/L 127(L) 129(L)  Potassium 3.5 - 5.1 mmol/L 3.6 4.4  Chloride 98 - 111 mmol/L 92(L) 94(L)  CO2 22 - 32 mmol/L 23 23  Calcium 8.9 - 10.3 mg/dL 2.1(H) 9.5  Total Protein 6.5 - 8.1 g/dL 6.5 7.0  Total Bilirubin 0.3 - 1.2 mg/dL 1.2 1.1  Alkaline Phos 38 - 126 U/L 68 61  AST 15 - 41 U/L 27 40  ALT 0 - 44 U/L 20 23   Studies/Results: No results found.  Active Problems:   Gastritis and  duodenitis    Willette Cluster, NP-C @  03/14/2020, 3:30 PM

## 2020-03-14 NOTE — ED Triage Notes (Signed)
Patient c/o abdominal pain and distention x 4 days. Patient also reports loose stools x 2-3 times today.  patieant was seen 3 days in the ED and patient states he was told he had "gallbladder problems."

## 2020-03-14 NOTE — ED Provider Notes (Addendum)
[facility] Provider Note   CSN: [id number] Arrival date & time: 03/14/20  1109     History Chief Complaint  Patient presents with  . Abdominal Pain    Allen Torres is a 79 y.o. male.  HPI     79 year old male comes in a chief complaint of abdominal pain, nausea, reduced p.o. intake.  Patient was seen in the ER 2 days ago.  He reports that he suddenly started having abdominal discomfort that was this intense 2 or 3 days ago.  He was told that he has cholelithiasis and stomach ulcers.  Patient has not set up any appointments.  Since being discharged he has had reduced p.o. intake.  Last night he had severe pain that finally gave up when he arrived to the ER.  Patient is unsure if the pain is provoked by p.o. intake.  The pain is located primarily in the right side of the abdomen and moves down.  He denies any UTI-like symptoms.  Patient denies nausea, vomiting.  Currently his pain is tolerable.  Past Medical History:  Diagnosis Date  . Hypertension   . Renal disorder    Patient states, "chronic kidney disease"    Patient Active Problem List   Diagnosis Date Noted  . Gastritis and duodenitis 03/14/2020    Past Surgical History:  Procedure Laterality Date  . facial cystectomy         History reviewed. No pertinent family history.  Social History   Tobacco Use  . Smoking status: Former Games developer  . Smokeless tobacco: Never Used  Vaping Use  . Vaping Use: Never used  Substance Use Topics  . Alcohol use: Never  . Drug use: Never    Home Medications Prior to Admission medications   Medication Sig Start Date End Date Taking? Authorizing Provider  acetaminophen (TYLENOL) 500 MG tablet Take 1,000 mg by mouth every 6 (six) hours as needed for moderate pain.   Yes [provider]  allopurinol (ZYLOPRIM) 100 MG tablet Take 200 mg by mouth daily. 02/09/20  Yes [provider]  Ascorbic Acid (VITAMIN C) 1000 MG tablet  Take 1,000 mg by mouth daily.   Yes [provider]  atenolol (TENORMIN) 100 MG tablet Take 50-100 mg by mouth 2 (two) times daily. Take 1 tablet (100 mg) in the morning and 1/2 tablet (50 mg) at bedtime 02/09/20  Yes [provider]  cholecalciferol (VITAMIN D3) 25 MCG (1000 UNIT) tablet Take 1,000 Units by mouth daily.   Yes [provider]  doxazosin (CARDURA) 2 MG tablet Take 2 mg by mouth every evening.  02/09/20  Yes [provider]  fluticasone (FLONASE) 50 MCG/ACT nasal spray Place 1 spray into both nostrils daily as needed for allergies.  02/09/20  Yes [provider]  hydrochlorothiazide (MICROZIDE) 12.5 MG capsule Take 12.5 mg by mouth daily. 02/09/20  Yes [provider]  HYDROcodone-acetaminophen (NORCO/VICODIN) 5-325 MG tablet Take 1-2 tablets by mouth every 6 (six) hours as needed. Patient taking differently: Take 1-2 tablets by mouth every 6 (six) hours as needed for moderate pain.  03/11/20  Yes Graciella Freer A, PA-C  lisinopril (ZESTRIL) 40 MG tablet Take 40 mg by mouth daily.  02/09/20  Yes [provider]  magnesium 30 MG tablet Take 30 mg by mouth daily.    Yes [provider]  vitamin B-12 (CYANOCOBALAMIN) 100 MCG tablet Take 100 mcg by mouth daily.   Yes [provider]  zinc  gluconate 50 MG tablet Take 50 mg by mouth daily.   Yes [provider]  ondansetron (ZOFRAN ODT) 4 MG disintegrating tablet Take 1 tablet (4 mg total) by mouth every 8 (eight) hours as needed for nausea or vomiting. 03/11/20   Maxwell Caul, PA-C    Allergies    Colchicine, Indocin [indomethacin], and Statins  Review of Systems   Review of Systems  Constitutional: Positive for activity change.  Respiratory: Negative for shortness of breath.   Cardiovascular: Negative for chest pain.  Gastrointestinal: Positive for abdominal distention and abdominal pain. Negative for blood in stool and vomiting.   Genitourinary: Negative for dysuria.  All other systems reviewed and are negative.   Physical Exam Updated Vital Signs BP (!) 146/56   Pulse 72   Temp 98.3 F (36.8 C) (Oral)   Resp 18   Ht 5\' 9"  (1.753 m)   Wt 100.2 kg   SpO2 97%   BMI 32.64 kg/m   Physical Exam Vitals and nursing note reviewed.  Constitutional:      Appearance: He is well-developed.  HENT:     Head: Atraumatic.  Cardiovascular:     Rate and Rhythm: Normal rate.  Pulmonary:     Effort: Pulmonary effort is normal.  Abdominal:     General: There is distension.     Tenderness: There is abdominal tenderness in the right upper quadrant and epigastric area. There is no guarding or rebound. Negative signs include Murphy's sign and McBurney's sign.  Musculoskeletal:     Cervical back: Neck supple.  Skin:    General: Skin is warm.  Neurological:     Mental Status: He is alert and oriented to person, place, and time.     ED Results / Procedures / Treatments   Labs (all labs ordered are listed, but only abnormal results are displayed) Labs Reviewed  COMPREHENSIVE METABOLIC PANEL - Abnormal; Notable for the following components:      Result Value   Sodium 127 (*)    Chloride 92 (*)    Glucose, Bld 178 (*)    BUN 35 (*)    Creatinine, Ser 1.74 (*)    Calcium 8.4 (*)    GFR, Estimated 40 (*)    All other components within normal limits  CBC - Abnormal; Notable for the following components:   RBC 3.27 (*)    Hemoglobin 11.0 (*)    HCT 31.3 (*)    All other components within normal limits  RESP PANEL BY RT-PCR (FLU A&B, COVID) ARPGX2  LIPASE, BLOOD  URINALYSIS, ROUTINE W REFLEX MICROSCOPIC    EKG None  Radiology No results found.  Procedures Procedures (including critical care time)  Medications Ordered in ED Medications  pantoprazole (PROTONIX) injection 40 mg (has no administration in time range)  sodium chloride 0.9 % bolus 1,000 mL (1,000 mLs Intravenous New Bag/Given 03/14/20 1432)     ED Course  I have reviewed the triage vital signs and the nursing notes.  Pertinent labs & imaging results that were available during my care of the patient were reviewed by me and considered in my medical decision making (see chart for details).  Clinical Course as of Mar 15 1523  Mon Mar 14, 2020  1522 Patient's creatinine has gone up.  Likely secondary to reduced p.o. intake.  I am a bit concerned about sending him home without any outpatient follow-up.  I have called patient's daughter on numerous location but there was no response.  Voicemail  was not left.  Ultimately I think it might be best to admit the patient and finish the work-up for epigastric abdominal tenderness  Creatinine(!): 1.74 [AN]  1523 LFTs are reassuring.  I discussed the case with Will, general surgery.  He reviewed the history with me along with patient's labs and ultrasound.  He thinks it might be a good idea to admit patient to medicine but also consult GI.  He has requested that surgery consult be placed again when appropriate by the inpatient team.  Dr. Lauro Regulus made aware of this  Comprehensive metabolic panel(!) [AN]  1523 Zerita Boers. from GI will see the patient.   [AN]    Clinical Course User Index [AN] Derwood Kaplan, MD   MDM Rules/Calculators/A&P                         DDx includes: Pancreatitis Hepatobiliary pathology including cholecystitis Gastritis/PUD SBO  Patient comes in a chief complaint of abdominal pain.  Patient was recently seen in the ER for same complaint.  I actually assessed him at that time he was having right upper quadrant tenderness.  His ultrasound had showed cholelithiasis.  CT scan was ordered by APP and it also revealed that he had gastritis.  Patient was discharged with outpatient follow-up, his labs are reassuring.  It appears that he has had intermittent bouts of abdominal discomfort, mostly in the right upper quadrant.  Currently his pain is better.  Negative  Murphy's.   Final Clinical Impression(s) / ED Diagnoses Final diagnoses:  PUD (peptic ulcer disease)  Calculus of gallbladder without cholecystitis without obstruction    Rx / DC Orders ED Discharge Orders    None         Derwood Kaplan, MD 03/14/20 1524

## 2020-03-14 NOTE — H&P (Signed)
History and Physical        Hospital Admission Note Date: 03/14/2020  Patient name: Allen Torres Medical record number: [id number] Date of birth: [birth year] Age: 79 y.o. Gender: male  PCP: Patient, No Pcp Per  Patient coming from: Home Lives with: Daughter   Chief Complaint    Chief Complaint  Patient presents with  . Abdominal Pain      HPI:   This is a 79 year old male with past medical history of hypertension, CKD unknown stage who is hard of hearing and who recently presented to the ED with RUQ abdominal pain on 11/26 who returns to the ED today for recurrent right-sided abdominal pain since this morning.  At his recent ED visit the patient had an RUQ [place] which showed cholelithiasis without cholecystitis.  He had a CT abdomen pelvis with contrast which was most notable for gastroduodenitis and central wall thickening of the distal esophagus reflecting esophagitis.  He was discharged in stable condition but has not set up any follow-up appointments as of yet.  This a.m. he had sudden onset right-sided abdominal pain in upper and lower quadrants that lasted several hours and is unsure if it is provoked by p.o. intake.  He only ate some toast this morning but has had decreased appetite lately.  No nausea or vomiting currently.  Did have a loose BM this morning.   ED Course: Initially afebrile and hemodynamically stable on room air but had a low-grade fever in the evening of 100.8 F. Notable Labs: Sodium 127, potassium 3.6, chloride 92, glucose 178, BUN 35, creatinine 1.74 (1.23 on 11/26) WBC 10.0 (was 15 11/26) Hb 11.0 (was 12.6 11/26), COVID-19 and flu negative.  No imaging today. Patient received 1 L NS bolus and maintenance fluid.  General surgery was consulted for evaluation of the RUQ pain who ultimately felt it was best to finish the work-up for epigastric and abdominal  tenderness and consult GI in reach back out to general surgery after the work-up.  GI was consulted by the ED   Vitals:   03/14/20 1600 03/14/20 1653  BP: (!) 167/79 (!) 151/53  Pulse: 75 72  Resp: 18 16  Temp:  (!) 100.8 F (38.2 C)  SpO2: 98% 99%     Review of Systems:  Review of Systems  All other systems reviewed and are negative.   Medical/Social/Family History   Past Medical History: Past Medical History:  Diagnosis Date  . Hypertension   . Renal disorder    Patient states, "chronic kidney disease"    Past Surgical History:  Procedure Laterality Date  . facial cystectomy      Medications: Prior to Admission medications   Medication Sig Start Date End Date Taking? Authorizing Provider  acetaminophen (TYLENOL) 500 MG tablet Take 1,000 mg by mouth every 6 (six) hours as needed for moderate pain.   Yes [provider]  allopurinol (ZYLOPRIM) 100 MG tablet Take 200 mg by mouth daily. 02/09/20  Yes [provider]  Ascorbic Acid (VITAMIN C) 1000 MG tablet Take 1,000 mg by mouth daily.   Yes [provider]  atenolol (TENORMIN) 100 MG tablet Take 50-100 mg by mouth 2 (two) times daily. Take 1  tablet (100 mg) in the morning and 1/2 tablet (50 mg) at bedtime 02/09/20  Yes [provider]  cholecalciferol (VITAMIN D3) 25 MCG (1000 UNIT) tablet Take 1,000 Units by mouth daily.   Yes [provider]  doxazosin (CARDURA) 2 MG tablet Take 2 mg by mouth every evening.  02/09/20  Yes [provider]  fluticasone (FLONASE) 50 MCG/ACT nasal spray Place 1 spray into both nostrils daily as needed for allergies.  02/09/20  Yes [provider]  hydrochlorothiazide (MICROZIDE) 12.5 MG capsule Take 12.5 mg by mouth daily. 02/09/20  Yes [provider]  HYDROcodone-acetaminophen (NORCO/VICODIN) 5-325 MG tablet Take 1-2 tablets by mouth every 6 (six) hours as needed. Patient taking differently: Take 1-2 tablets by mouth  every 6 (six) hours as needed for moderate pain.  03/11/20  Yes Graciella FreerLayden, Lindsey A, PA-C  lisinopril (ZESTRIL) 40 MG tablet Take 40 mg by mouth daily.  02/09/20  Yes [provider]  magnesium 30 MG tablet Take 30 mg by mouth daily.    Yes [provider]  vitamin B-12 (CYANOCOBALAMIN) 100 MCG tablet Take 100 mcg by mouth daily.   Yes [provider]  zinc gluconate 50 MG tablet Take 50 mg by mouth daily.   Yes [provider]  ondansetron (ZOFRAN ODT) 4 MG disintegrating tablet Take 1 tablet (4 mg total) by mouth every 8 (eight) hours as needed for nausea or vomiting. 03/11/20   Maxwell CaulLayden, Lindsey A, PA-C    Allergies:   Allergies  Allergen Reactions  . Colchicine Diarrhea  . Indocin [Indomethacin]     "messed up my liver"  . Statins Other (See Comments)    Patient states that he hurt so bad, hard to get up in the morning.  Felt like his system was shut down and under able to have a bowel movement    Social History:  reports that he has quit smoking. He has never used smokeless tobacco. He reports that he does not drink alcohol and does not use drugs.  Family History: History reviewed. No pertinent family history.   Objective   Physical Exam: Blood pressure (!) 151/53, pulse 72, temperature (!) 100.8 F (38.2 C), temperature source Oral, resp. rate 16, height 5\' 9"  (1.753 m), weight 100.2 kg, SpO2 99 %.  Physical Exam Vitals and nursing note reviewed.  Constitutional:      Appearance: Normal appearance.  HENT:     Head: Normocephalic and atraumatic.  Eyes:     Conjunctiva/sclera: Conjunctivae normal.  Cardiovascular:     Rate and Rhythm: Normal rate and regular rhythm.     Heart sounds: Murmur heard.  Systolic murmur is present with a grade of 3/6.   Pulmonary:     Effort: Pulmonary effort is normal.     Breath sounds: Normal breath sounds.  Abdominal:     General: There is distension.     Tenderness: There is abdominal tenderness in  the right upper quadrant and right lower quadrant.  Musculoskeletal:        General: No swelling or tenderness.  Skin:    Coloration: Skin is not jaundiced or pale.  Neurological:     Mental Status: He is alert. Mental status is at baseline.  Psychiatric:        Mood and Affect: Mood normal.        Behavior: Behavior normal.     LABS on Admission: I have personally reviewed all the labs and imaging below    Basic  Metabolic Panel: Recent Labs  Lab 03/11/20 1012 03/14/20 1156  NA 129* 127*  K 4.4 3.6  CL 94* 92*  CO2 23 23  GLUCOSE 171* 178*  BUN 24* 35*  CREATININE 1.23 1.74*  CALCIUM 9.5 8.4*   Liver Function Tests: Recent Labs  Lab 03/11/20 1012 03/14/20 1156  AST 40 27  ALT 23 20  ALKPHOS 61 68  BILITOT 1.1 1.2  PROT 7.0 6.5  ALBUMIN 4.5 3.8   Recent Labs  Lab 03/11/20 1012 03/14/20 1156  LIPASE 28 27   No results for input(s): AMMONIA in the last 168 hours. CBC: Recent Labs  Lab 03/11/20 1012 03/11/20 1012 03/14/20 1156  WBC 15.1*  --  10.0  HGB 12.6*  --  11.0*  HCT 35.8*  --  31.3*  MCV 96.0   < > 95.7  PLT 221  --  199   < > = values in this interval not displayed.   Cardiac Enzymes: No results for input(s): CKTOTAL, CKMB, CKMBINDEX, TROPONINI in the last 168 hours. BNP: Invalid input(s): POCBNP CBG: No results for input(s): GLUCAP in the last 168 hours.  Radiological Exams on Admission:  No results found.    EKG: not done   A & P   Principal Problem:   Abdominal pain Active Problems:   Gastritis and duodenitis   Hyponatremia   AKI (acute kidney injury) (HCC)   Essential hypertension   Fever   1. Right-sided abdominal pain of unclear etiology a. 11/26: RUQ [place] which showed cholelithiasis without cholecystitis.   b. 11/26: CT abdomen pelvis with contrast which was most notable for gastroduodenitis and central wall thickening of the distal esophagus reflecting esophagitis. c. GI consulted d. General surgery consulted in  the ED and recommends reaching back out once work-up has been completed for possible cholecystectomy  2. Probable esophagitis and gastroduodenitis a. IV fluids b. PPI daily c. Probably will need an EGD  3. Acute on chronic normocytic anemia a. Hb 12.6-> 11.0 in the past few days, hemodynamically stable b. Follows at [facility] for his anemia c. Monitor  4. Hyponatremia a. Likely from poor p.o. intake b. IV fluids  5. AKI on CKD 3, unknown subtype a. Likely from poor p.o. intake b. IV fluids and holding lisinopril  6. Fever without an obvious source of infection a. T 100.71F without leukocytosis today b. Check a UA and blood cultures  7. Hypertension a. Holding lisinopril as above    DVT prophylaxis: lovenox   Code Status: Full Code  Diet: clear liquid Family Communication: Admission, patients condition and plan of care including tests being ordered have been discussed with the patient who indicates understanding and agrees with the plan and Code Status.  Disposition Plan: The appropriate patient status for this patient is OBSERVATION. Observation status is judged to be reasonable and necessary in order to provide the required intensity of service to ensure the patient's safety. The patient's presenting symptoms, physical exam findings, and initial radiographic and laboratory data in the context of their medical condition is felt to place them at decreased risk for further clinical deterioration. Furthermore, it is anticipated that the patient will be medically stable for discharge from the hospital within 2 midnights of admission. The following factors support the patient status of observation.   " The patient's presenting symptoms include abdominal pain. " The physical exam findings include right sided abdominal pain. " The initial radiographic and laboratory data are hyponatremia.    Consultants  .  GI . ED discussed with General Surgery  Procedures  . none  Time  Spent on Admission: 66 minutes    Jae Dire, DO [facility]  03/14/2020, 5:29 PM

## 2020-03-14 NOTE — ED Notes (Signed)
Report called for pt transfer to the floor  

## 2020-03-15 DIAGNOSIS — R1010 Upper abdominal pain, unspecified: Secondary | ICD-10-CM | POA: Diagnosis not present

## 2020-03-15 DIAGNOSIS — K2990 Gastroduodenitis, unspecified, without bleeding: Secondary | ICD-10-CM

## 2020-03-15 DIAGNOSIS — R1011 Right upper quadrant pain: Secondary | ICD-10-CM | POA: Diagnosis not present

## 2020-03-15 DIAGNOSIS — R933 Abnormal findings on diagnostic imaging of other parts of digestive tract: Secondary | ICD-10-CM | POA: Diagnosis not present

## 2020-03-15 LAB — CBC
HCT: 27.7 % — ABNORMAL LOW (ref 39.0–52.0)
Hemoglobin: 9.7 g/dL — ABNORMAL LOW (ref 13.0–17.0)
MCH: 33.4 pg (ref 26.0–34.0)
MCHC: 35 g/dL (ref 30.0–36.0)
MCV: 95.5 fL (ref 80.0–100.0)
Platelets: 180 10*3/uL (ref 150–400)
RBC: 2.9 MIL/uL — ABNORMAL LOW (ref 4.22–5.81)
RDW: 13.4 % (ref 11.5–15.5)
WBC: 8.3 10*3/uL (ref 4.0–10.5)
nRBC: 0 % (ref 0.0–0.2)

## 2020-03-15 LAB — BASIC METABOLIC PANEL
Anion gap: 9 (ref 5–15)
BUN: 24 mg/dL — ABNORMAL HIGH (ref 8–23)
CO2: 22 mmol/L (ref 22–32)
Calcium: 8.1 mg/dL — ABNORMAL LOW (ref 8.9–10.3)
Chloride: 101 mmol/L (ref 98–111)
Creatinine, Ser: 1.4 mg/dL — ABNORMAL HIGH (ref 0.61–1.24)
GFR, Estimated: 51 mL/min — ABNORMAL LOW (ref >60)
Glucose, Bld: 76 mg/dL (ref 70–99)
Potassium: 3.8 mmol/L (ref 3.5–5.1)
Sodium: 132 mmol/L — ABNORMAL LOW (ref 135–145)

## 2020-03-15 MED ORDER — ADULT MULTIVITAMIN W/MINERALS CH
1.0000 | ORAL_TABLET | Freq: Every day | ORAL | Status: DC
  Administered 2020-03-15 – 2020-03-19 (×4): 1 via ORAL
  Filled 2020-03-15 (×4): qty 1

## 2020-03-15 MED ORDER — BOOST / RESOURCE BREEZE PO LIQD CUSTOM
1.0000 | Freq: Three times a day (TID) | ORAL | Status: DC
  Administered 2020-03-15 – 2020-03-19 (×4): 1 via ORAL

## 2020-03-15 MED ORDER — PROSOURCE PLUS PO LIQD
30.0000 mL | Freq: Two times a day (BID) | ORAL | Status: DC
  Administered 2020-03-19: 09:00:00 30 mL via ORAL
  Filled 2020-03-15 (×3): qty 30

## 2020-03-15 NOTE — H&P (View-Only) (Signed)
Progress Note  Chief Complaint:   Right sided abdominal pain     ASSESSMENT / PLAN:    # 79 year old male with right-sided abdominal pain x 4 days (started in the RUQ now involving the whole right abdomen).  Evaluated in ED 3 days ago, WBC 15K.  CTAP suggested distal esophageal wall thickening,  mild wall thickening of the stomach and first portion of the duodenum, cholelithiasis without evidence for acute cholecystitis.  Pain improved, discharged home from ED, back today with ongoing right sided abdominal pain.  Liver chemistries have been normal.  --Though abdominal pain seems out of proportion to the degree of gastroduodenitis on CT scan, patient will still need upper endoscopy to start. He is on schedule for tomorrow at 2:15pm.  If EGD negative, consider further workup for gallbladder disease even though diffuse distribution of right sided abdominal pain is atypical. .   --Agree with QD PPI for now  # Distal esophageal wall thickening on CT scan. Probably has esophagitis. No dysphagia. Further evaluation at time of EGD  # Chronic normocytic anemia.. Hgb averaging 11.8 to 12.8 over last couple of years at [facility]. Hgb 12.6 in ED 3 days ago, then down to 11 yesterday and now at 9.0.  No overt GI bleeding. Decline in hgb may be partly due to IVF.  --MCV 95. By [facility] labs in August patient considered macrocytic. B12 and folate at that time were normal.  --Ferritin is elevated in 400 range and TIBC is low making iron deficiency seem unlikely however percent sat is only 8% --Further evaluation at time of EGD  # AKI. Creatinine improving 1.74 >> 1.4.       SUBJECTIVE:   Still having intermittent right sided abdominal pain. Pain involves the whole right side from RUQ down into RLQ. Tolerating clear liquids    OBJECTIVE:    Scheduled inpatient medications:  . (feeding supplement) PROSource Plus  30 mL Oral BID WC  . allopurinol  200 mg Oral Daily  . atenolol  100 mg  Oral Daily  . atenolol  50 mg Oral QHS  . doxazosin  2 mg Oral QPM  . enoxaparin (LOVENOX) injection  40 mg Subcutaneous Q24H  . feeding supplement  1 Container Oral TID BM  . multivitamin with minerals  1 tablet Oral Daily  . pantoprazole (PROTONIX) IV  40 mg Intravenous Q24H   Continuous inpatient infusions:  PRN inpatient medications: acetaminophen, morphine injection, ondansetron **OR** ondansetron (ZOFRAN) IV, polyethylene glycol  Vital signs in last 24 hours: Temp:  [98.2 F (36.8 C)-100.8 F (38.2 C)] 99.2 F (37.3 C) (11/30 0536) Pulse Rate:  [65-78] 65 (11/30 0536) Resp:  [15-19] 19 (11/30 0536) BP: (132-172)/(53-79) 133/53 (11/30 0536) SpO2:  [93 %-99 %] 93 % (11/30 0536) Last BM Date: 03/14/20  Intake/Output Summary (Last 24 hours) at 03/15/2020 1344 Last data filed at 03/15/2020 0911 Gross per 24 hour  Intake 1095 ml  Output 350 ml  Net 745 ml     Physical Exam:  . General: Alert male in NAD . Heart:  Regular rate and rhythm. No lower extremity edema . Pulmonary: Normal respiratory effort . Abdomen: Soft, nondistended, nontender. Normal bowel sounds.  . Neurologic: Alert and oriented . Psych: Pleasant. Cooperative.   Filed Weights   03/14/20 1115  Weight: 100.2 kg    Intake/Output from previous day: 11/29 0701 - 11/30 0700 In: 1095 [P.O.:120; I.V.:975] Out: 350 [Urine:350] Intake/Output this shift: No intake/output data recorded.  Lab Results: Recent Labs    03/14/20 1156 03/15/20 0603  WBC 10.0 8.3  HGB 11.0* 9.7*  HCT 31.3* 27.7*  PLT 199 180   BMET Recent Labs    03/14/20 1156 03/15/20 0603  NA 127* 132*  K 3.6 3.8  CL 92* 101  CO2 23 22  GLUCOSE 178* 76  BUN 35* 24*  CREATININE 1.74* 1.40*  CALCIUM 8.4* 8.1*   LFT Recent Labs    03/14/20 1156  PROT 6.5  ALBUMIN 3.8  AST 27  ALT 20  ALKPHOS 68  BILITOT 1.2   PT/INR No results for input(s): LABPROT, INR in the last 72 hours. Hepatitis Panel No results for  input(s): HEPBSAG, HCVAB, HEPAIGM, HEPBIGM in the last 72 hours.  No results found.    Principal Problem:   Abdominal pain Active Problems:   Gastritis and duodenitis   Hyponatremia   AKI (acute kidney injury) (HCC)   Essential hypertension   Fever     LOS: 0 days   Willette Cluster ,NP 03/15/2020, 1:44 PM

## 2020-03-15 NOTE — Progress Notes (Signed)
PROGRESS NOTE    Allen Torres  ZOX:[id number] DOB: 22-Jan-1941 DOA: 03/14/2020 PCP: Patient, No Pcp Per   Chief Complaint  Patient presents with  . Abdominal Pain   Brief Narrative: 79 year old male with history of CKD unknown stage, hypertension, anemia morbid obesity with BMI 32 cm in the ED with recurrent right-sided abdominal pain.Right-sided abdominal pain for 4 days prior to admission initially seen in the ED 3 days previously with leukocytosis CT abdomen pelvis #2 distal esophageal wall thickening and mild thickening of the stomach and first portion of duodenum, was discharged from ED but came back with ongoing abdominal pain.    Subjective: Seen this morning alert awake, abdomen pain has not bothered him whole lot but was tender on examination on his right abdomen.   Assessment & Plan:   Right-sided abdominal pain for 4 days prior to admission initially seen in the ED 3 days previously with leukocytosis CT abdomen pelvis #2 distal esophageal wall thickening and mild thickening of the stomach and first portion of duodenum, was discharged from ED but came back with ongoing abdominal pain.  GI following for EGD tomorrow afternoon, continue PPI, supportive measures.  N.p.o. past midnight.  General surgery consulted for possible cholecystectomy but wanted GI evaluation prior to formal consult.  Distal esophageal wall thickening on CT scan likely esophagitis but no dysphagia.  EGD pending.  Chronic normocytic anemia hemoglobin slightly down trended likely in the setting of IV fluids but no overt GI bleeding does have some microcytosis B12 and folate at that time were normal recently at [facility].  Further evaluation with EGD. Recent Labs  Lab 03/11/20 1012 03/14/20 1156 03/15/20 0603  HGB 12.6* 11.0* 9.7*  HCT 35.8* 31.3* 27.7*   AKI : Recent creatinine 1.2 on 11/26 with estimated GFR more than 60 at baseline. Creat improving continue oral intake. Recent Labs  Lab  03/11/20 1012 03/14/20 1156 03/15/20 0603  BUN 24* 35* 24*  CREATININE 1.23 1.74* 1.40*   Hyponatremia: Suspect volume depletion dehydration.  Improving Recent Labs  Lab 03/11/20 1012 03/14/20 1156 03/15/20 0603  NA 129* 127* 132*   Essential hypertension: Borderline control on atenolol.  Continue to monitor.  Fever without obvious source of infection low-grade fever.  Currently no leukocytosis.  WBC count was 15.1K on 11/26.  Blood culture has been sent and pending  Obesity with BMI 32 will benefit weight loss  Nutrition: Diet Order            Diet NPO time specified  Diet effective midnight           Diet clear liquid Room service appropriate? Yes; Fluid consistency: Thin  Diet effective now                 Nutrition Problem: Inadequate oral intake Etiology: altered GI function Signs/Symptoms: meal completion < 25% Interventions: Boost Breeze, Prostat, MVI  Body mass index is 32.64 kg/m.  DVT prophylaxis: enoxaparin (LOVENOX) injection 40 mg Start: 03/14/20 2200 Code Status:   Code Status: Full Code  Family Communication: plan of care discussed with patient at bedside.  Status is: Admitted as observation Patient remains hospitalized for ongoing management of abdominal pain for further GI work-up with endoscopy and GI consultation evaluation.  Dispo: The patient is from: Home with family              Anticipated d/c is to: Home              Anticipated d/c date is:  2 days              Patient currently is not medically stable to d/c. Consultants:see note  Procedures:see note  Culture/Microbiology    Component Value Date/Time   SDES  03/14/2020 2011    BLOOD RIGHT ANTECUBITAL Performed at [facility], [address] [place], [place] [address]    SPECREQUEST  03/14/2020 2011    BOTTLES DRAWN AEROBIC AND ANAEROBIC Blood Culture adequate volume Performed at [facility], [address], [place], [place] [address]     CULT  03/14/2020 2011    NO GROWTH < 12 HOURS Performed at [facility], [address] [place], [place] [address]    REPTSTATUS PENDING 03/14/2020 2011    Other culture-see note  Medications: Scheduled Meds: . (feeding supplement) PROSource Plus  30 mL Oral BID WC  . allopurinol  200 mg Oral Daily  . atenolol  100 mg Oral Daily  . atenolol  50 mg Oral QHS  . doxazosin  2 mg Oral QPM  . enoxaparin (LOVENOX) injection  40 mg Subcutaneous Q24H  . feeding supplement  1 Container Oral TID BM  . multivitamin with minerals  1 tablet Oral Daily  . pantoprazole (PROTONIX) IV  40 mg Intravenous Q24H   Continuous Infusions:  Antimicrobials: Anti-infectives (From admission, onward)   None     Objective: Vitals: Today's Vitals   03/15/20 0243 03/15/20 0536 03/15/20 0959 03/15/20 1427  BP: 132/60 (!) 133/53  (!) 152/64  Pulse: 65 65  67  Resp: 16 19  17   Temp: 98.2 F (36.8 C) 99.2 F (37.3 C)  98.9 F (37.2 C)  TempSrc: Oral Oral  Oral  SpO2: 95% 93%  94%  Weight:      Height:      PainSc:   2      Intake/Output Summary (Last 24 hours) at 03/15/2020 1433 Last data filed at 03/15/2020 0911 Gross per 24 hour  Intake 1095 ml  Output 350 ml  Net 745 ml   Filed Weights   03/14/20 1115  Weight: 100.2 kg   Weight change:   Intake/Output from previous day: 11/29 0701 - 11/30 0700 In: 1095 [P.O.:120; I.V.:975] Out: 350 [Urine:350] Intake/Output this shift: No intake/output data recorded.  Examination: General exam: AAOx3 ,NAD, weak appearing. HEENT:Oral mucosa moist, Ear/Nose WNL grossly,dentition normal. Respiratory system: bilaterally clear,no wheezing or crackles,no use of accessory muscle, non tender. Cardiovascular system: S1 & S2 +, regular, No JVD. Gastrointestinal system: Abdomen soft, right upper quadrant is tender,ND, BS+. Nervous System:Alert, awake, moving extremities and grossly nonfocal Extremities: No edema, distal peripheral pulses palpable.   Skin: No rashes,no icterus. MSK: Normal muscle bulk,tone, power  Data Reviewed: I have personally reviewed following labs and imaging studies CBC: Recent Labs  Lab 03/11/20 1012 03/14/20 1156 03/15/20 0603  WBC 15.1* 10.0 8.3  HGB 12.6* 11.0* 9.7*  HCT 35.8* 31.3* 27.7*  MCV 96.0 95.7 95.5  PLT 221 199 180   Basic Metabolic Panel: Recent Labs  Lab 03/11/20 1012 03/14/20 1156 03/15/20 0603  NA 129* 127* 132*  K 4.4 3.6 3.8  CL 94* 92* 101  CO2 23 23 22   GLUCOSE 171* 178* 76  BUN 24* 35* 24*  CREATININE 1.23 1.74* 1.40*  CALCIUM 9.5 8.4* 8.1*   GFR: Estimated Creatinine Clearance: 50.7 mL/min (A) (by C-G formula based on SCr of 1.4 mg/dL (H)). Liver Function Tests: Recent Labs  Lab 03/11/20 1012 03/14/20 1156  AST 40 27  ALT 23 20  ALKPHOS 61 68  BILITOT 1.1 1.2  PROT 7.0 6.5  ALBUMIN 4.5 3.8   Recent Labs  Lab 03/11/20 1012 03/14/20 1156  LIPASE 28 27   No results for input(s): AMMONIA in the last 168 hours. Coagulation Profile: No results for input(s): INR, PROTIME in the last 168 hours. Cardiac Enzymes: No results for input(s): CKTOTAL, CKMB, CKMBINDEX, TROPONINI in the last 168 hours. BNP (last 3 results) No results for input(s): PROBNP in the last 8760 hours. HbA1C: No results for input(s): HGBA1C in the last 72 hours. CBG: No results for input(s): GLUCAP in the last 168 hours. Lipid Profile: No results for input(s): CHOL, HDL, LDLCALC, TRIG, CHOLHDL, LDLDIRECT in the last 72 hours. Thyroid Function Tests: No results for input(s): TSH, T4TOTAL, FREET4, T3FREE, THYROIDAB in the last 72 hours. Anemia Panel: Recent Labs    03/14/20 1156  FERRITIN 489*  TIBC 241*  IRON 19*   Sepsis Labs: No results for input(s): PROCALCITON, LATICACIDVEN in the last 168 hours.  Recent Results (from the past 240 hour(s))  Resp Panel by RT-PCR (Flu A&B, Covid) Nasopharyngeal Swab     Status: None   Collection Time: 03/14/20  2:34 PM   Specimen:  Nasopharyngeal Swab; Nasopharyngeal(NP) swabs in vial transport medium  Result Value Ref Range Status   SARS Coronavirus 2 by RT PCR NEGATIVE NEGATIVE Final    Comment: (NOTE) SARS-CoV-2 target nucleic acids are NOT DETECTED.  The SARS-CoV-2 RNA is generally detectable in upper respiratory specimens during the acute phase of infection. The lowest concentration of SARS-CoV-2 viral copies this assay can detect is 138 copies/mL. A negative result does not preclude SARS-Cov-2 infection and should not be used as the sole basis for treatment or other patient management decisions. A negative result may occur with  improper specimen collection/handling, submission of specimen other than nasopharyngeal swab, presence of viral mutation(s) within the areas targeted by this assay, and inadequate number of viral copies(<138 copies/mL). A negative result must be combined with clinical observations, patient history, and epidemiological information. The expected result is Negative.  Fact Sheet for Patients:  BloggerCourse.comhttps://www.fda.gov/media/152166/download  Fact Sheet for Healthcare Providers:  SeriousBroker.ithttps://www.fda.gov/media/152162/download  This test is no t yet approved or cleared by the [place] FDA and  has been authorized for detection and/or diagnosis of SARS-CoV-2 by FDA under an Emergency Use Authorization (EUA). This EUA will remain  in effect (meaning this test can be used) for the duration of the COVID-19 declaration under Section 564(b)(1) of the Act, 21 U.S.C.section 360bbb-3(b)(1), unless the authorization is terminated  or revoked sooner.       Influenza A by PCR NEGATIVE NEGATIVE Final   Influenza B by PCR NEGATIVE NEGATIVE Final    Comment: (NOTE) The Xpert Xpress SARS-CoV-2/FLU/RSV plus assay is intended as an aid in the diagnosis of influenza from Nasopharyngeal swab specimens and should not be used as a sole basis for treatment. Nasal washings and aspirates are unacceptable for  Xpert Xpress SARS-CoV-2/FLU/RSV testing.  Fact Sheet for Patients: BloggerCourse.comhttps://www.fda.gov/media/152166/download  Fact Sheet for Healthcare Providers: SeriousBroker.ithttps://www.fda.gov/media/152162/download  This test is not yet approved or cleared by the [place] FDA and has been authorized for detection and/or diagnosis of SARS-CoV-2 by FDA under an Emergency Use Authorization (EUA). This EUA will remain in effect (meaning this test can be used) for the duration of the COVID-19 declaration under Section 564(b)(1) of the Act, 21 U.S.C. section 360bbb-3(b)(1), unless the authorization is terminated or revoked.  Performed at [facility], [address] [place], [place] [address]   Culture, blood (routine x 2)     Status: None (Preliminary result)   Collection Time: 03/14/20  8:10 PM   Specimen: BLOOD  Result Value Ref Range Status   Specimen Description   Final    BLOOD LEFT ANTECUBITAL Performed at [facility], [address] [place], [place] [address]    Special Requests   Final    BOTTLES DRAWN AEROBIC AND ANAEROBIC Blood Culture adequate volume Performed at [facility], [address] [place], [place] [address]    Culture   Final    NO GROWTH < 12 HOURS Performed at [facility], [address], [place], [place] [address]    Report Status PENDING  Incomplete  Culture, blood (routine x 2)     Status: None (Preliminary result)   Collection Time: 03/14/20  8:11 PM   Specimen: BLOOD  Result Value Ref Range Status   Specimen Description   Final    BLOOD RIGHT ANTECUBITAL Performed at [facility], [address] [place], [place] [address]    Special Requests   Final    BOTTLES DRAWN AEROBIC AND ANAEROBIC Blood Culture adequate volume Performed at [facility], [address] [place], [place] [address]    Culture   Final    NO GROWTH < 12 HOURS Performed at [facility], [address], [place], [place] [address]    Report Status PENDING  Incomplete     Radiology Studies: No results found.   LOS: 0 days   Lanae Boast, MD [facility]  03/15/2020, 2:33 PM

## 2020-03-15 NOTE — Progress Notes (Signed)
Initial Nutrition Assessment  DOCUMENTATION CODES:   Obesity unspecified  INTERVENTION:   -Boost Breeze po TID, each supplement provides 250 kcal and 9 grams of protein -30 ml Prosource Plus BID, each supplement provides 100 kcals and 15 grams protein -MVI with minerals daily  NUTRITION DIAGNOSIS:   Inadequate oral intake related to altered GI function as evidenced by meal completion < 25%.  GOAL:   Patient will meet greater than or equal to 90% of their needs  MONITOR:   PO intake, Supplement acceptance, Diet advancement, Labs, Weight trends, Skin, I & O's  REASON FOR ASSESSMENT:   Malnutrition Screening Tool    ASSESSMENT:   79 year old male with past medical history of hypertension, CKD unknown stage who is hard of hearing and who recently presented to the ED with RUQ abdominal pain on 11/26 who returns to the ED today for recurrent right-sided abdominal pain since this morning  Pt admitted with rt sided abdominal pain and probable esophagitis and gastroduodenitis.   11/26- s/p RUQ [place] which showed cholelithiasis without cholecystitis;s/p CT abdomen pelvis with contrast which was most notable for gastroduodenitis and central wall thickening of the distal esophagus reflecting esophagitis.  Reviewed I/O's: +745 ml x 24 hours  UOP: 350 ml x 24 hours  Attempted to speak with pt via call to hospital room phone, however, unable to reach.   Per GI notes, plan for EGD on 03/16/20.   Per H&P, pt with poor appetite PTA (consumed only toast day of admission). He is currently on a clear liquid diet. He would benefit from addition of oral nutrition supplements due to limitations of current diet order. Noted meal completion 0%.  Reviewed wt hx from [facility]where. Pt was 216# on 08/20/19. Wt has been stable over the past 3 months.   Medications reviewed.   Labs reviewed: Na: 132.   Diet Order:   Diet Order            Diet clear liquid Room service appropriate? Yes; Fluid  consistency: Thin  Diet effective now                 EDUCATION NEEDS:   No education needs have been identified at this time  Skin:  Skin Assessment: Reviewed RN Assessment  Last BM:  03/14/20  Height:   Ht Readings from Last 1 Encounters:  03/14/20 5\' 9"  (1.753 m)    Weight:   Wt Readings from Last 1 Encounters:  03/14/20 100.2 kg    Ideal Body Weight:  72.7 kg  BMI:  Body mass index is 32.64 kg/m.  Estimated Nutritional Needs:   Kcal:  2000-2200  Protein:  95-110 grams  Fluid:  > 2 L    03/16/20, RD, LDN, CDCES Registered Dietitian II Certified Diabetes Care and Education Specialist Please refer to [facility] for RD and/or RD on-call/weekend/after hours pager

## 2020-03-15 NOTE — Progress Notes (Signed)
Progress Note  Chief Complaint:   Right sided abdominal pain     ASSESSMENT / PLAN:    # 79 year old male with right-sided abdominal pain x 4 days (started in the RUQ now involving the whole right abdomen).  Evaluated in ED 3 days ago, WBC 15K.  CTAP suggested distal esophageal wall thickening,  mild wall thickening of the stomach and first portion of the duodenum, cholelithiasis without evidence for acute cholecystitis.  Pain improved, discharged home from ED, back today with ongoing right sided abdominal pain.  Liver chemistries have been normal.  --Though abdominal pain seems out of proportion to the degree of gastroduodenitis on CT scan, patient will still need upper endoscopy to start. He is on schedule for tomorrow at 2:15pm.  If EGD negative, consider further workup for gallbladder disease even though diffuse distribution of right sided abdominal pain is atypical. .   --Agree with QD PPI for now  # Distal esophageal wall thickening on CT scan. Probably has esophagitis. No dysphagia. Further evaluation at time of EGD  # Chronic normocytic anemia.. Hgb averaging 11.8 to 12.8 over last couple of years at [facility]. Hgb 12.6 in ED 3 days ago, then down to 11 yesterday and now at 9.0.  No overt GI bleeding. Decline in hgb may be partly due to IVF.  --MCV 95. By [facility] labs in August patient considered macrocytic. B12 and folate at that time were normal.  --Ferritin is elevated in 400 range and TIBC is low making iron deficiency seem unlikely however percent sat is only 8% --Further evaluation at time of EGD  # AKI. Creatinine improving 1.74 >> 1.4.       SUBJECTIVE:   Still having intermittent right sided abdominal pain. Pain involves the whole right side from RUQ down into RLQ. Tolerating clear liquids    OBJECTIVE:    Scheduled inpatient medications:  . (feeding supplement) PROSource Plus  30 mL Oral BID WC  . allopurinol  200 mg Oral Daily  . atenolol  100 mg  Oral Daily  . atenolol  50 mg Oral QHS  . doxazosin  2 mg Oral QPM  . enoxaparin (LOVENOX) injection  40 mg Subcutaneous Q24H  . feeding supplement  1 Container Oral TID BM  . multivitamin with minerals  1 tablet Oral Daily  . pantoprazole (PROTONIX) IV  40 mg Intravenous Q24H   Continuous inpatient infusions:  PRN inpatient medications: acetaminophen, morphine injection, ondansetron **OR** ondansetron (ZOFRAN) IV, polyethylene glycol  Vital signs in last 24 hours: Temp:  [98.2 F (36.8 C)-100.8 F (38.2 C)] 99.2 F (37.3 C) (11/30 0536) Pulse Rate:  [65-78] 65 (11/30 0536) Resp:  [15-19] 19 (11/30 0536) BP: (132-172)/(53-79) 133/53 (11/30 0536) SpO2:  [93 %-99 %] 93 % (11/30 0536) Last BM Date: 03/14/20  Intake/Output Summary (Last 24 hours) at 03/15/2020 1344 Last data filed at 03/15/2020 0911 Gross per 24 hour  Intake 1095 ml  Output 350 ml  Net 745 ml     Physical Exam:  . General: Alert male in NAD . Heart:  Regular rate and rhythm. No lower extremity edema . Pulmonary: Normal respiratory effort . Abdomen: Soft, nondistended, nontender. Normal bowel sounds.  . Neurologic: Alert and oriented . Psych: Pleasant. Cooperative.   Filed Weights   03/14/20 1115  Weight: 100.2 kg    Intake/Output from previous day: 11/29 0701 - 11/30 0700 In: 1095 [P.O.:120; I.V.:975] Out: 350 [Urine:350] Intake/Output this shift: No intake/output data recorded.  Lab Results: Recent Labs    03/14/20 1156 03/15/20 0603  WBC 10.0 8.3  HGB 11.0* 9.7*  HCT 31.3* 27.7*  PLT 199 180   BMET Recent Labs    03/14/20 1156 03/15/20 0603  NA 127* 132*  K 3.6 3.8  CL 92* 101  CO2 23 22  GLUCOSE 178* 76  BUN 35* 24*  CREATININE 1.74* 1.40*  CALCIUM 8.4* 8.1*   LFT Recent Labs    03/14/20 1156  PROT 6.5  ALBUMIN 3.8  AST 27  ALT 20  ALKPHOS 68  BILITOT 1.2   PT/INR No results for input(s): LABPROT, INR in the last 72 hours. Hepatitis Panel No results for  input(s): HEPBSAG, HCVAB, HEPAIGM, HEPBIGM in the last 72 hours.  No results found.    Principal Problem:   Abdominal pain Active Problems:   Gastritis and duodenitis   Hyponatremia   AKI (acute kidney injury) (HCC)   Essential hypertension   Fever     LOS: 0 days   Denise Bramblett ,NP 03/15/2020, 1:44 PM       

## 2020-03-16 ENCOUNTER — Inpatient Hospital Stay (HOSPITAL_COMMUNITY): Payer: Medicare HMO

## 2020-03-16 ENCOUNTER — Observation Stay (HOSPITAL_COMMUNITY): Payer: Medicare HMO | Admitting: Anesthesiology

## 2020-03-16 ENCOUNTER — Encounter (HOSPITAL_COMMUNITY): Payer: Self-pay | Admitting: Internal Medicine

## 2020-03-16 ENCOUNTER — Encounter (HOSPITAL_COMMUNITY)
Admission: EM | Disposition: A | Discharge status: Home / Self Care | Payer: Self-pay | Source: Home / Self Care | Attending: Internal Medicine

## 2020-03-16 DIAGNOSIS — D509 Iron deficiency anemia, unspecified: Secondary | ICD-10-CM | POA: Diagnosis present

## 2020-03-16 DIAGNOSIS — E669 Obesity, unspecified: Secondary | ICD-10-CM | POA: Diagnosis present

## 2020-03-16 DIAGNOSIS — K811 Chronic cholecystitis: Secondary | ICD-10-CM | POA: Diagnosis not present

## 2020-03-16 DIAGNOSIS — Z888 Allergy status to other drugs, medicaments and biological substances status: Secondary | ICD-10-CM | POA: Diagnosis not present

## 2020-03-16 DIAGNOSIS — R1011 Right upper quadrant pain: Secondary | ICD-10-CM | POA: Diagnosis not present

## 2020-03-16 DIAGNOSIS — R9389 Abnormal findings on diagnostic imaging of other specified body structures: Secondary | ICD-10-CM

## 2020-03-16 DIAGNOSIS — Z87891 Personal history of nicotine dependence: Secondary | ICD-10-CM | POA: Diagnosis not present

## 2020-03-16 DIAGNOSIS — K3189 Other diseases of stomach and duodenum: Secondary | ICD-10-CM

## 2020-03-16 DIAGNOSIS — K2990 Gastroduodenitis, unspecified, without bleeding: Secondary | ICD-10-CM | POA: Diagnosis present

## 2020-03-16 DIAGNOSIS — K224 Dyskinesia of esophagus: Secondary | ICD-10-CM | POA: Diagnosis present

## 2020-03-16 DIAGNOSIS — R0981 Nasal congestion: Secondary | ICD-10-CM | POA: Diagnosis not present

## 2020-03-16 DIAGNOSIS — E440 Moderate protein-calorie malnutrition: Secondary | ICD-10-CM | POA: Diagnosis present

## 2020-03-16 DIAGNOSIS — E872 Acidosis: Secondary | ICD-10-CM | POA: Diagnosis present

## 2020-03-16 DIAGNOSIS — Z20822 Contact with and (suspected) exposure to covid-19: Secondary | ICD-10-CM | POA: Diagnosis present

## 2020-03-16 DIAGNOSIS — I10 Essential (primary) hypertension: Secondary | ICD-10-CM | POA: Diagnosis present

## 2020-03-16 DIAGNOSIS — K209 Esophagitis, unspecified without bleeding: Secondary | ICD-10-CM | POA: Diagnosis present

## 2020-03-16 DIAGNOSIS — E861 Hypovolemia: Secondary | ICD-10-CM | POA: Diagnosis not present

## 2020-03-16 DIAGNOSIS — Z6832 Body mass index (BMI) 32.0-32.9, adult: Secondary | ICD-10-CM | POA: Diagnosis not present

## 2020-03-16 DIAGNOSIS — K5900 Constipation, unspecified: Secondary | ICD-10-CM | POA: Diagnosis present

## 2020-03-16 DIAGNOSIS — R1010 Upper abdominal pain, unspecified: Secondary | ICD-10-CM | POA: Diagnosis not present

## 2020-03-16 DIAGNOSIS — K8012 Calculus of gallbladder with acute and chronic cholecystitis without obstruction: Secondary | ICD-10-CM | POA: Diagnosis present

## 2020-03-16 DIAGNOSIS — Z79899 Other long term (current) drug therapy: Secondary | ICD-10-CM | POA: Diagnosis not present

## 2020-03-16 DIAGNOSIS — K8000 Calculus of gallbladder with acute cholecystitis without obstruction: Secondary | ICD-10-CM | POA: Diagnosis not present

## 2020-03-16 DIAGNOSIS — R1013 Epigastric pain: Secondary | ICD-10-CM | POA: Diagnosis present

## 2020-03-16 DIAGNOSIS — N179 Acute kidney failure, unspecified: Secondary | ICD-10-CM | POA: Diagnosis present

## 2020-03-16 DIAGNOSIS — E871 Hypo-osmolality and hyponatremia: Secondary | ICD-10-CM | POA: Diagnosis present

## 2020-03-16 DIAGNOSIS — M109 Gout, unspecified: Secondary | ICD-10-CM | POA: Diagnosis present

## 2020-03-16 HISTORY — PX: BIOPSY: SHX5522

## 2020-03-16 HISTORY — PX: ESOPHAGOGASTRODUODENOSCOPY: SHX5428

## 2020-03-16 LAB — BASIC METABOLIC PANEL
Anion gap: 13 (ref 5–15)
BUN: 17 mg/dL (ref 8–23)
CO2: 20 mmol/L — ABNORMAL LOW (ref 22–32)
Calcium: 8.5 mg/dL — ABNORMAL LOW (ref 8.9–10.3)
Chloride: 101 mmol/L (ref 98–111)
Creatinine, Ser: 1.23 mg/dL (ref 0.61–1.24)
GFR, Estimated: >60 mL/min (ref >60)
Glucose, Bld: 73 mg/dL (ref 70–99)
Potassium: 3.9 mmol/L (ref 3.5–5.1)
Sodium: 134 mmol/L — ABNORMAL LOW (ref 135–145)

## 2020-03-16 LAB — CBC
HCT: 30.8 % — ABNORMAL LOW (ref 39.0–52.0)
Hemoglobin: 10.5 g/dL — ABNORMAL LOW (ref 13.0–17.0)
MCH: 33.1 pg (ref 26.0–34.0)
MCHC: 34.1 g/dL (ref 30.0–36.0)
MCV: 97.2 fL (ref 80.0–100.0)
Platelets: 195 10*3/uL (ref 150–400)
RBC: 3.17 MIL/uL — ABNORMAL LOW (ref 4.22–5.81)
RDW: 13.5 % (ref 11.5–15.5)
WBC: 9 10*3/uL (ref 4.0–10.5)
nRBC: 0 % (ref 0.0–0.2)

## 2020-03-16 SURGERY — EGD (ESOPHAGOGASTRODUODENOSCOPY)
Anesthesia: Monitor Anesthesia Care

## 2020-03-16 MED ORDER — PROPOFOL 10 MG/ML IV BOLUS
INTRAVENOUS | Status: DC | PRN
  Administered 2020-03-16: 40 mg via INTRAVENOUS
  Administered 2020-03-16 (×6): 20 mg via INTRAVENOUS
  Administered 2020-03-16: 50 mg via INTRAVENOUS
  Administered 2020-03-16 (×2): 20 mg via INTRAVENOUS

## 2020-03-16 MED ORDER — HYDRALAZINE HCL 20 MG/ML IJ SOLN
5.0000 mg | Freq: Four times a day (QID) | INTRAMUSCULAR | Status: DC | PRN
  Administered 2020-03-17 – 2020-03-19 (×3): 5 mg via INTRAVENOUS
  Filled 2020-03-16 (×3): qty 1

## 2020-03-16 MED ORDER — SODIUM CHLORIDE 0.9 % IV SOLN: INTRAVENOUS | Status: DC

## 2020-03-16 MED ORDER — IOHEXOL 300 MG/ML  SOLN
100.0000 mL | Freq: Once | INTRAMUSCULAR | Status: AC | PRN
  Administered 2020-03-16: 100 mL via INTRAVENOUS

## 2020-03-16 MED ORDER — TRAMADOL HCL 50 MG PO TABS
50.0000 mg | ORAL_TABLET | Freq: Four times a day (QID) | ORAL | Status: DC | PRN
  Administered 2020-03-16: 12:00:00 50 mg via ORAL
  Filled 2020-03-16: qty 1

## 2020-03-16 MED ORDER — LACTATED RINGERS IV SOLN: INTRAVENOUS | Status: DC | PRN

## 2020-03-16 MED ORDER — PANTOPRAZOLE SODIUM 40 MG IV SOLR
40.0000 mg | Freq: Two times a day (BID) | INTRAVENOUS | Status: DC
  Filled 2020-03-16: qty 40

## 2020-03-16 MED ORDER — LIDOCAINE 2% (20 MG/ML) 5 ML SYRINGE
INTRAMUSCULAR | Status: DC | PRN
  Administered 2020-03-16 (×2): 40 mg via INTRAVENOUS

## 2020-03-16 MED ORDER — IOHEXOL 9 MG/ML PO SOLN
500.0000 mL | ORAL | Status: AC
  Administered 2020-03-16 (×2): 500 mL via ORAL

## 2020-03-16 NOTE — Anesthesia Preprocedure Evaluation (Addendum)
Anesthesia Evaluation  Patient identified by MRN, date of birth, ID band Patient awake    Reviewed: Allergy & Precautions, H&P , NPO status , Patient's Chart, lab work & pertinent test results, reviewed documented beta blocker date and time   Airway Mallampati: III  TM Distance: >3 FB Neck ROM: full    Dental no notable dental hx. (+) Edentulous Upper, Edentulous Lower   Pulmonary neg pulmonary ROS, former smoker,    Pulmonary exam normal breath sounds clear to auscultation       Cardiovascular Exercise Tolerance: Good hypertension, Pt. on medications negative cardio ROS   Rhythm:regular Rate:Normal     Neuro/Psych negative neurological ROS  negative psych ROS   GI/Hepatic negative GI ROS, Neg liver ROS,   Endo/Other  negative endocrine ROS  Renal/GU CRF and ARFRenal disease  negative genitourinary   Musculoskeletal   Abdominal   Peds  Hematology  (+) Blood dyscrasia, anemia ,   Anesthesia Other Findings   Reproductive/Obstetrics negative OB ROS                           Anesthesia Physical Anesthesia Plan  ASA: III  Anesthesia Plan: MAC   Post-op Pain Management:    Induction:   PONV Risk Score and Plan: 1 and Propofol infusion  Airway Management Planned: Nasal Cannula, Natural Airway, Simple Face Mask and Mask  Additional Equipment:   Intra-op Plan:   Post-operative Plan:   Informed Consent: I have reviewed the patients History and Physical, chart, labs and discussed the procedure including the risks, benefits and alternatives for the proposed anesthesia with the patient or authorized representative who has indicated his/her understanding and acceptance.     Dental Advisory Given  Plan Discussed with: CRNA and Anesthesiologist  Anesthesia Plan Comments:         Anesthesia Quick Evaluation

## 2020-03-16 NOTE — Anesthesia Procedure Notes (Signed)
Procedure Name: MAC Date/Time: 03/16/2020 2:38 PM Performed by: Cynda Familia, CRNA Pre-anesthesia Checklist: Patient identified, Emergency Drugs available, Suction available, Patient being monitored and Timeout performed Patient Re-evaluated:Patient Re-evaluated prior to induction Oxygen Delivery Method: Simple face mask Placement Confirmation: positive ETCO2 and breath sounds checked- equal and bilateral Dental Injury: Teeth and Oropharynx as per pre-operative assessment

## 2020-03-16 NOTE — Progress Notes (Addendum)
Securechat with Dr Dayna Barker, to change Protonix to po.  No need for PIV at this time.  Unable to reach [employer] via telephone, therefore Securechat sent to [employer] to notify.

## 2020-03-16 NOTE — Progress Notes (Signed)
PROGRESS NOTE    Allen Torres  DUK:[id number] DOB: 1941/01/07 DOA: 03/14/2020 PCP: Patient, No Pcp Per   Chief Complaint  Patient presents with  . Abdominal Pain   Brief Narrative: 79 year old male with history of CKD unknown stage, hypertension, anemia morbid obesity with BMI 32 cm in the ED with recurrent right-sided abdominal pain.Right-sided abdominal pain for 4 days prior to admission initially seen in the ED 3 days previously with leukocytosis CT abdomen pelvis #2 distal esophageal wall thickening and mild thickening of the stomach and first portion of duodenum, was discharged from ED but came back with ongoing abdominal pain.  Patient is admitted, GI consulted.  Subjective: Waiting for endoscopy today. Complains of epigastric upper abdominal pain Refusing IV access per nursing staff  Assessment & Plan:  Right-sided abdominal pain, with abnormal CT abdomen pelvis showing distal esophageal wall thickening and mild thickening of the stomach and first portion of duodenum: Still complains of abdominal pain.  Continue on Protonix.  Appreciate GI input and pending EGD evaluation. Ultrasound abdomen 11/26 showed cholelithiasis.  Chronic normocytic anemia hemoglobin slightly down trended likely in the setting of IV fluids but no overt GI bleeding, hemoglobin seems to have improved.  Monitor.  Recent Labs  Lab 03/11/20 1012 03/14/20 1156 03/15/20 0603 03/16/20 0528  HGB 12.6* 11.0* 9.7* 10.5*  HCT 35.8* 31.3* 27.7* 30.8*    Mild metabolic acidosis- cont po  AKI : In the setting of #1 with poor oral intake.  It has resolved.  Recent Labs  Lab 03/11/20 1012 03/14/20 1156 03/15/20 0603 03/16/20 0528  BUN 24* 35* 24* 17  CREATININE 1.23 1.74* 1.40* 1.23   Hyponatremia: Suspect volume depletion dehydration.  Stable and improving Recent Labs  Lab 03/11/20 1012 03/14/20 1156 03/15/20 0603 03/16/20 0528  NA 129* 127* 132* 134*   Essential hypertension: BP fairly stable.   Continue on doxazosin and atenolol  Low-grade fever without obvious source of infection , had leukocytosis few days ago and has resolved.  No recurrence of low-grade fever.  Blood culture no growth so far.   Obesity with BMI 32 will benefit weight loss  Nutrition: Diet Order            Diet NPO time specified  Diet effective midnight                 Nutrition Problem: Inadequate oral intake Etiology: altered GI function Signs/Symptoms: meal completion < 25% Interventions: Boost Breeze, Prostat, MVI  Body mass index is 32.64 kg/m.  DVT prophylaxis:  Code Status:   Code Status: Full Code  Family Communication: plan of care discussed with patient at bedside.  Status is: Admitted as observation Patient remains hospitalized for ongoing management of abdominal pain for further GI work-up with endoscopy and GI consultation evaluation.  Dispo: The patient is from: Home with family              Anticipated d/c is to: Home              Anticipated d/c date is:1 day after EGD and if okay with GI              Patient currently is not medically stable to d/c. Consultants:see note  Procedures:see note  Culture/Microbiology    Component Value Date/Time   SDES  03/14/2020 2011    BLOOD RIGHT ANTECUBITAL Performed at [facility], [address] [place], [place] [address]    [facility]  03/14/2020 2011  BOTTLES DRAWN AEROBIC AND ANAEROBIC Blood Culture adequate volume Performed at [facility], [address] [place], [place] [address]    CULT  03/14/2020 2011    NO GROWTH < 12 HOURS Performed at [facility], [address], [place], [place] [address]    REPTSTATUS PENDING 03/14/2020 2011    Other culture-see note  Medications: Scheduled Meds: . (feeding supplement) PROSource Plus  30 mL Oral BID WC  . allopurinol  200 mg Oral Daily  . atenolol  100 mg Oral Daily  . atenolol  50 mg Oral QHS  . doxazosin  2 mg Oral QPM  .  feeding supplement  1 Container Oral TID BM  . multivitamin with minerals  1 tablet Oral Daily  . pantoprazole (PROTONIX) IV  40 mg Intravenous Q24H   Continuous Infusions:  Antimicrobials: Anti-infectives (From admission, onward)   None     Objective: Vitals: Today's Vitals   03/15/20 1427 03/15/20 1914 03/15/20 2110 03/16/20 0510  BP: (!) 152/64  (!) 149/56 (!) 159/64  Pulse: 67  61 74  Resp: 17  18 17   Temp: 98.9 F (37.2 C)  98.5 F (36.9 C) 99.3 F (37.4 C)  TempSrc: Oral  Oral Oral  SpO2: 94%  93% 93%  Weight:      Height:      PainSc:  5       Intake/Output Summary (Last 24 hours) at 03/16/2020 0748 Last data filed at 03/16/2020 0600 Gross per 24 hour  Intake 120 ml  Output 0 ml  Net 120 ml   Filed Weights   03/14/20 1115  Weight: 100.2 kg   Weight change:   Intake/Output from previous day: 11/30 0701 - 12/01 0700 In: 120 [P.O.:120] Out: 0  Intake/Output this shift: No intake/output data recorded.  Examination: General exam: AAOx3,NAD,weak appearing. HEENT:Oral mucosa moist, Ear/Nose WNL grossly, dentition normal. Respiratory system: bilaterally  clear,no wheezing or crackles,no use of accessory muscle Cardiovascular system:S1 & S2 +,No JVD,. Gastrointestinal system:Abdomen soft, there was a surgical NT,ND, BS+ Nervous System:Alert, awake, moving extremities and grossly nonfocal Extremities: No edema, distal peripheral pulses palpable.  Skin: No rashes,no icterus. MSK: Normal muscle bulk,tone, power.  Data Reviewed: I have personally reviewed following labs and imaging studies CBC: Recent Labs  Lab 03/11/20 1012 03/14/20 1156 03/15/20 0603 03/16/20 0528  WBC 15.1* 10.0 8.3 9.0  HGB 12.6* 11.0* 9.7* 10.5*  HCT 35.8* 31.3* 27.7* 30.8*  MCV 96.0 95.7 95.5 97.2  PLT 221 199 180 195   Basic Metabolic Panel: Recent Labs  Lab 03/11/20 1012 03/14/20 1156 03/15/20 0603 03/16/20 0528  NA 129* 127* 132* 134*  K 4.4 3.6 3.8 3.9  CL 94* 92*  101 101  CO2 23 23 22  20*  GLUCOSE 171* 178* 76 73  BUN 24* 35* 24* 17  CREATININE 1.23 1.74* 1.40* 1.23  CALCIUM 9.5 8.4* 8.1* 8.5*   GFR: Estimated Creatinine Clearance: 57.8 mL/min (by C-G formula based on SCr of 1.23 mg/dL). Liver Function Tests: Recent Labs  Lab 03/11/20 1012 03/14/20 1156  AST 40 27  ALT 23 20  ALKPHOS 61 68  BILITOT 1.1 1.2  PROT 7.0 6.5  ALBUMIN 4.5 3.8   Recent Labs  Lab 03/11/20 1012 03/14/20 1156  LIPASE 28 27   No results for input(s): AMMONIA in the last 168 hours. Coagulation Profile: No results for input(s): INR, PROTIME in the last 168 hours. Cardiac Enzymes: No results for input(s): CKTOTAL, CKMB, CKMBINDEX, TROPONINI  in the last 168 hours. BNP (last 3 results) No results for input(s): PROBNP in the last 8760 hours. HbA1C: No results for input(s): HGBA1C in the last 72 hours. CBG: No results for input(s): GLUCAP in the last 168 hours. Lipid Profile: No results for input(s): CHOL, HDL, LDLCALC, TRIG, CHOLHDL, LDLDIRECT in the last 72 hours. Thyroid Function Tests: No results for input(s): TSH, T4TOTAL, FREET4, T3FREE, THYROIDAB in the last 72 hours. Anemia Panel: Recent Labs    03/14/20 1156  FERRITIN 489*  TIBC 241*  IRON 19*   Sepsis Labs: No results for input(s): PROCALCITON, LATICACIDVEN in the last 168 hours.  Recent Results (from the past 240 hour(s))  Resp Panel by RT-PCR (Flu A&B, Covid) Nasopharyngeal Swab     Status: None   Collection Time: 03/14/20  2:34 PM   Specimen: Nasopharyngeal Swab; Nasopharyngeal(NP) swabs in vial transport medium  Result Value Ref Range Status   SARS Coronavirus 2 by RT PCR NEGATIVE NEGATIVE Final    Comment: (NOTE) SARS-CoV-2 target nucleic acids are NOT DETECTED.  The SARS-CoV-2 RNA is generally detectable in upper respiratory specimens during the acute phase of infection. The lowest concentration of SARS-CoV-2 viral copies this assay can detect is 138 copies/mL. A negative result  does not preclude SARS-Cov-2 infection and should not be used as the sole basis for treatment or other patient management decisions. A negative result may occur with  improper specimen collection/handling, submission of specimen other than nasopharyngeal swab, presence of viral mutation(s) within the areas targeted by this assay, and inadequate number of viral copies(<138 copies/mL). A negative result must be combined with clinical observations, patient history, and epidemiological information. The expected result is Negative.  Fact Sheet for Patients:  BloggerCourse.comhttps://www.fda.gov/media/152166/download  Fact Sheet for Healthcare Providers:  SeriousBroker.ithttps://www.fda.gov/media/152162/download  This test is no t yet approved or cleared by the [place] FDA and  has been authorized for detection and/or diagnosis of SARS-CoV-2 by FDA under an Emergency Use Authorization (EUA). This EUA will remain  in effect (meaning this test can be used) for the duration of the COVID-19 declaration under Section 564(b)(1) of the Act, 21 U.S.C.section 360bbb-3(b)(1), unless the authorization is terminated  or revoked sooner.       Influenza A by PCR NEGATIVE NEGATIVE Final   Influenza B by PCR NEGATIVE NEGATIVE Final    Comment: (NOTE) The Xpert Xpress SARS-CoV-2/FLU/RSV plus assay is intended as an aid in the diagnosis of influenza from Nasopharyngeal swab specimens and should not be used as a sole basis for treatment. Nasal washings and aspirates are unacceptable for Xpert Xpress SARS-CoV-2/FLU/RSV testing.  Fact Sheet for Patients: BloggerCourse.comhttps://www.fda.gov/media/152166/download  Fact Sheet for Healthcare Providers: SeriousBroker.ithttps://www.fda.gov/media/152162/download  This test is not yet approved or cleared by the [place] FDA and has been authorized for detection and/or diagnosis of SARS-CoV-2 by FDA under an Emergency Use Authorization (EUA). This EUA will remain in effect (meaning this test can be used) for  the duration of the COVID-19 declaration under Section 564(b)(1) of the Act, 21 U.S.C. section 360bbb-3(b)(1), unless the authorization is terminated or revoked.  Performed at [facility], [address] [place], [place] [address]   Culture, blood (routine x 2)     Status: None (Preliminary result)   Collection Time: 03/14/20  8:10 PM   Specimen: BLOOD  Result Value Ref Range Status   Specimen Description   Final    BLOOD LEFT ANTECUBITAL Performed at [facility], [address] [place], [place]  [address]    Special Requests   Final    BOTTLES DRAWN AEROBIC AND ANAEROBIC Blood Culture adequate volume Performed at [facility], [address] [place], [place] [address]    Culture   Final    NO GROWTH < 12 HOURS Performed at [facility], [address] [place], [place] [address]    Report Status PENDING  Incomplete  Culture, blood (routine x 2)     Status: None (Preliminary result)   Collection Time: 03/14/20  8:11 PM   Specimen: BLOOD  Result Value Ref Range Status   Specimen Description   Final    BLOOD RIGHT ANTECUBITAL Performed at [facility], [address], [place], [place] [address]    Special Requests   Final    BOTTLES DRAWN AEROBIC AND ANAEROBIC Blood Culture adequate volume Performed at [facility], [address], [place], [place] [address]    Culture   Final    NO GROWTH < 12 HOURS Performed at [facility], [address] [place], [place] [address]    Report Status PENDING  Incomplete     Radiology Studies: No results found.   LOS: 0 days   Lanae Boast, MD [facility]  03/16/2020, 7:48 AM

## 2020-03-16 NOTE — Progress Notes (Signed)
IV nurse came to place new IV, patient refused. Will try again later.

## 2020-03-16 NOTE — Op Note (Signed)
[facility] Patient Name: Allen Torres Procedure Date: 03/16/2020 MRN: [id number] Attending MD: Carlota Raspberry. Sophee Mckimmy , MD Date of Birth: 1941-04-01 CSN: [id number] Age: 79 Admit Type: Outpatient Procedure:                Upper GI endoscopy Indications:              Abdominal pain in the right upper quadrant of                            unclear etiology, gallstones on [place], LFTs normal,                            thickening of duodenum and esophagus on CT Scan                            from 11/26 Providers:                Carlota Raspberry. Havery Moros, MD, Benetta Spar,                            Technician, Ladona Ridgel, Technician, Cleda Daub, RN Referring MD:              Medicines:                Monitored Anesthesia Care Complications:            No immediate complications. Estimated blood loss:                            Minimal. Estimated Blood Loss:     Estimated blood loss was minimal. Procedure:                Pre-Anesthesia Assessment:                           - Prior to the procedure, a History and Physical                            was performed, and patient medications and                            allergies were reviewed. The patient's tolerance of                            previous anesthesia was also reviewed. The risks                            and benefits of the procedure and the sedation                            options and risks were discussed with the patient.                            All questions were answered,  and informed consent                            was obtained. Prior Anticoagulants: The patient has                            taken no previous anticoagulant or antiplatelet                            agents. ASA Grade Assessment: III - A patient with                            severe systemic disease. After reviewing the risks                            and benefits, the patient was deemed in                             satisfactory condition to undergo the procedure.                           After obtaining informed consent, the endoscope was                            passed under direct vision. Throughout the                            procedure, the patient's blood pressure, pulse, and                            oxygen saturations were monitored continuously. The                            GIF-H190 ([id number]) Olympus gastroscope was                            introduced through the mouth, and advanced to the                            second part of duodenum. The upper GI endoscopy was                            accomplished without difficulty. The patient                            tolerated the procedure well. Scope In: Scope Out: Findings:      Esophagogastric landmarks were identified: the Z-line was found at 41       cm, the gastroesophageal junction was found at 41 cm and the upper       extent of the gastric folds was found at 41 cm from the incisors.      The esophagus was tortous with some spacicity of the distal esophagus,       without esophagitis. The exam of the esophagus was otherwise normal.  Diffuse mildly erythematous mucosa was found in the gastric fundus and       in the gastric body.      No other significant abnormalities were identified in a careful       examination of the stomach.      Biopsies were taken with a cold forceps in the gastric body, at the       incisura and in the gastric antrum for Helicobacter pylori testing.      An abnormal deformity was found in the duodenal bulb with luminal       narrowing / edema of the bulb / duodenal sweep. It was not obstructed,       duodenoscope could easily traverse the area but it was tight with       restricted mobility. Unclear if prior scarring in this area from history       of PUD? There was no obvious ulceration / mass lesion, unclear if       instrinic or possibly extrinsic. Biopsies were taken with a  cold forceps       for histology.      The exam of the duodenum was otherwise normal. Impression:               - Esophagogastric landmarks identified.                           - Tortous esophagus with spasm                           - Normal esophagus otherwise                           - Erythematous mucosa in the gastric fundus and                            gastric body.                           - Normal stomach otherwise - biopsied to rule out H                            pylori                           - Duodenal edema with luminal narrowing, although                            no obvious ulceration / mass lesion / or active                            obstruction. Unclear if this finding is causing or                            related to his present symptoms and chronicity of                            this. Possible he could have prior scarring if  history of duodenal ulcer vs. component of                            extrinsic compression. Biopsied.                           - Normal duodenum otherwise. Moderate Sedation:      No moderate sedation, case performed with MAC Recommendation:           - Return patient to hospital ward for ongoing care.                           - NPO for now                           - Continue present medications including IV PPI                           - Await pathology results.                           - Repeat CT scan with PO and IV contrast given                            persistent ongoing abdominal pain and findings on                            this exam                           - If CT negative and symptoms persist,                            consideration for HIDA scan but LFTs normal and                            symptoms atypical for biliary colic, although                            gallstones noted on [place]                           - GI service will continue to follow up Procedure Code(s):        ---  Professional ---                           [phone], Esophagogastroduodenoscopy, flexible,                            transoral; with biopsy, single or multiple Diagnosis Code(s):        --- Professional ---                           K31.89, Other diseases of stomach and duodenum  R10.11, Right upper quadrant pain CPT copyright 2019 American Medical Association. All rights reserved. The codes documented in this report are preliminary and upon coder review may  be revised to meet current compliance requirements. Remo Lipps P. Preslee Regas, MD 03/16/2020 3:33:25 PM This report has been signed electronically. Number of Addenda: 0

## 2020-03-16 NOTE — Transfer of Care (Signed)
Immediate Anesthesia Transfer of Care Note  Patient: Allen Torres  Procedure(s) Performed: ESOPHAGOGASTRODUODENOSCOPY (EGD) (N/A )  Patient Location: PACU and Endoscopy Unit  Anesthesia Type:MAC  Level of Consciousness: awake and alert   Airway & Oxygen Therapy: Patient Spontanous Breathing and Patient connected to face mask oxygen  Post-op Assessment: Report given to RN and Post -op Vital signs reviewed and stable  Post vital signs: Reviewed and stable  Last Vitals:  Vitals Value Taken Time  BP    Temp    Pulse 72 03/16/20 1517  Resp 18 03/16/20 1517  SpO2 100 % 03/16/20 1517  Vitals shown include unvalidated device data.  Last Pain:  Vitals:   03/16/20 1351  TempSrc: Temporal  PainSc: 0-No pain      Patients Stated Pain Goal: 2 (41/28/78 6767)  Complications: No complications documented.

## 2020-03-16 NOTE — Interval H&P Note (Signed)
History and Physical Interval Note: Patient continues to have mostly RUQ pain. Have discussed risks / benefit with him regarding EGD and anesthesia, he wishes to proceed, further recommendations pending the results.    03/16/2020 2:33 PM  Allen Torres  has presented today for surgery, with the diagnosis of Abdominal Pain & Abnormal CT.  The various methods of treatment have been discussed with the patient and family. After consideration of risks, benefits and other options for treatment, the patient has consented to  Procedure(s): ESOPHAGOGASTRODUODENOSCOPY (EGD) (N/A) as a surgical intervention.  The patient's history has been reviewed, patient examined, no change in status, stable for surgery.  I have reviewed the patient's chart and labs.  Questions were answered to the patient's satisfaction.     Allen Torres

## 2020-03-16 NOTE — Progress Notes (Signed)
Patient is refusing for floor RN to place IV due to being a hard stick. IV consult placed.

## 2020-03-16 NOTE — Anesthesia Postprocedure Evaluation (Signed)
Anesthesia Post Note  Patient: Allen Torres  Procedure(s) Performed: ESOPHAGOGASTRODUODENOSCOPY (EGD) (N/A )     Patient location during evaluation: PACU Anesthesia Type: MAC Level of consciousness: awake and alert Pain management: pain level controlled Vital Signs Assessment: post-procedure vital signs reviewed and stable Respiratory status: spontaneous breathing, nonlabored ventilation, respiratory function stable and patient connected to nasal cannula oxygen Cardiovascular status: stable and blood pressure returned to baseline Postop Assessment: no apparent nausea or vomiting Anesthetic complications: no   No complications documented.  Last Vitals:  Vitals:   03/16/20 1536 03/16/20 1605  BP: (!) 186/53 (!) 185/74  Pulse: 66 65  Resp: (!) 22 18  Temp:  36.8 C  SpO2: 98% 95%    Last Pain:  Vitals:   03/16/20 1605  TempSrc: Oral  PainSc:                  Duha Abair

## 2020-03-17 ENCOUNTER — Inpatient Hospital Stay (HOSPITAL_COMMUNITY): Payer: Medicare HMO

## 2020-03-17 ENCOUNTER — Other Ambulatory Visit: Payer: Self-pay

## 2020-03-17 ENCOUNTER — Encounter (HOSPITAL_COMMUNITY): Payer: Self-pay | Admitting: Gastroenterology

## 2020-03-17 DIAGNOSIS — R1011 Right upper quadrant pain: Secondary | ICD-10-CM | POA: Diagnosis not present

## 2020-03-17 DIAGNOSIS — R9389 Abnormal findings on diagnostic imaging of other specified body structures: Secondary | ICD-10-CM | POA: Diagnosis not present

## 2020-03-17 DIAGNOSIS — R1010 Upper abdominal pain, unspecified: Secondary | ICD-10-CM | POA: Diagnosis not present

## 2020-03-17 DIAGNOSIS — K8000 Calculus of gallbladder with acute cholecystitis without obstruction: Secondary | ICD-10-CM | POA: Diagnosis not present

## 2020-03-17 LAB — CBC
HCT: 31 % — ABNORMAL LOW (ref 39.0–52.0)
Hemoglobin: 10.9 g/dL — ABNORMAL LOW (ref 13.0–17.0)
MCH: 33.4 pg (ref 26.0–34.0)
MCHC: 35.2 g/dL (ref 30.0–36.0)
MCV: 95.1 fL (ref 80.0–100.0)
Platelets: 223 10*3/uL (ref 150–400)
RBC: 3.26 MIL/uL — ABNORMAL LOW (ref 4.22–5.81)
RDW: 13.4 % (ref 11.5–15.5)
WBC: 10.3 10*3/uL (ref 4.0–10.5)
nRBC: 0 % (ref 0.0–0.2)

## 2020-03-17 LAB — HEPATIC FUNCTION PANEL
ALT: 20 U/L (ref 0–44)
AST: 28 U/L (ref 15–41)
Albumin: 3.3 g/dL — ABNORMAL LOW (ref 3.5–5.0)
Alkaline Phosphatase: 75 U/L (ref 38–126)
Bilirubin, Direct: 0.2 mg/dL (ref 0.0–0.2)
Indirect Bilirubin: 1.2 mg/dL — ABNORMAL HIGH (ref 0.3–0.9)
Total Bilirubin: 1.4 mg/dL — ABNORMAL HIGH (ref 0.3–1.2)
Total Protein: 6 g/dL — ABNORMAL LOW (ref 6.5–8.1)

## 2020-03-17 LAB — BASIC METABOLIC PANEL
Anion gap: 16 — ABNORMAL HIGH (ref 5–15)
BUN: 16 mg/dL (ref 8–23)
CO2: 18 mmol/L — ABNORMAL LOW (ref 22–32)
Calcium: 8.5 mg/dL — ABNORMAL LOW (ref 8.9–10.3)
Chloride: 97 mmol/L — ABNORMAL LOW (ref 98–111)
Creatinine, Ser: 1.2 mg/dL (ref 0.61–1.24)
GFR, Estimated: >60 mL/min (ref >60)
Glucose, Bld: 80 mg/dL (ref 70–99)
Potassium: 3.7 mmol/L (ref 3.5–5.1)
Sodium: 131 mmol/L — ABNORMAL LOW (ref 135–145)

## 2020-03-17 LAB — SURGICAL PATHOLOGY

## 2020-03-17 MED ORDER — MORPHINE SULFATE (PF) 4 MG/ML IV SOLN
3.0000 mg | Freq: Once | INTRAVENOUS | Status: AC
  Administered 2020-03-17: 17:00:00 3 mg via INTRAVENOUS
  Filled 2020-03-17: qty 1

## 2020-03-17 MED ORDER — TRAMADOL HCL 50 MG PO TABS: 50.0000 mg | ORAL_TABLET | Freq: Four times a day (QID) | ORAL | Status: DC | PRN

## 2020-03-17 MED ORDER — TECHNETIUM TC 99M MEBROFENIN IV KIT
5.5000 | PACK | Freq: Once | INTRAVENOUS | Status: AC
  Administered 2020-03-17: 16:00:00 5.5 via INTRAVENOUS

## 2020-03-17 MED ORDER — SODIUM CHLORIDE 0.9 % IV SOLN
2.0000 g | INTRAVENOUS | Status: AC
  Administered 2020-03-18: 2 g via INTRAVENOUS
  Filled 2020-03-17 (×2): qty 20

## 2020-03-17 NOTE — Progress Notes (Signed)
Progress Note   Subjective  Chief Complaint: Right-sided abdominal pain  EGD 03/16/2020 with duodenal edema with luminal narrowing, although no obvious ulceration/mass lesion/or active obstruction, unclear if this finding is caused or related to his present symptoms and chronicity of this, possibly could have prior scarring of history of duodenal ulcer versus component of extrinsic compression  CT abdomen pelvis 03/16/2020 with new changes consistent with gallbladder wall thickening and pericholecystic inflammatory change with known cholelithiasis highly suggestive of acute cholecystitis, this is thought to possibly cause changes of extrinsic compression seen during time of EGD.  Today, the patient tells me that he is "fed up" as no one is really telling him what is going on and he is just laying here.  Tells me that over the past 3 days or so he has developed a chronic cough and some nasal congestion and is worried about this.  Also tells me his blood pressure is staying very high and he has dealt with this for 30 years and they need to keep him on "some kind of medicine for this".  He is worried about having a stroke and "becoming a vegetable".  Explains that he really has no further right upper quadrant discomfort at this time.  No nausea or vomiting.  He is very hungry because "no one has fed me in here".   Objective   Vital signs in last 24 hours: Temp:  [97.7 F (36.5 C)-98.8 F (37.1 C)] 98.4 F (36.9 C) (12/02 0804) Pulse Rate:  [65-71] 70 (12/02 0804) Resp:  [16-25] 20 (12/02 0804) BP: (155-202)/(49-89) 170/71 (12/02 0804) SpO2:  [95 %-100 %] 95 % (12/02 0804) Weight:  [100.2 kg] 100.2 kg (12/01 1351) Last BM Date: 03/16/20 General:    Elderly white male in NAD Heart:  Regular rate and rhythm; no murmurs Lungs: Respirations even and unlabored, lungs CTA bilaterally Abdomen:  Soft,mild RUQ ttp and nondistended. Normal bowel sounds. Extremities:  Without edema. Neurologic:   Alert and oriented,  grossly normal neurologically. Psych:  Cooperative. Normal mood and affect.  Intake/Output from previous day: 12/01 0701 - 12/02 0700 In: 1163.3 [P.O.:360; I.V.:803.3] Out: 0   Lab Results: Recent Labs    03/14/20 1156 03/15/20 0603 03/16/20 0528  WBC 10.0 8.3 9.0  HGB 11.0* 9.7* 10.5*  HCT 31.3* 27.7* 30.8*  PLT 199 180 195   BMET Recent Labs    03/14/20 1156 03/15/20 0603 03/16/20 0528  NA 127* 132* 134*  K 3.6 3.8 3.9  CL 92* 101 101  CO2 23 22 20*  GLUCOSE 178* 76 73  BUN 35* 24* 17  CREATININE 1.74* 1.40* 1.23  CALCIUM 8.4* 8.1* 8.5*   LFT Recent Labs    03/14/20 1156  PROT 6.5  ALBUMIN 3.8  AST 27  ALT 20  ALKPHOS 68  BILITOT 1.2   Studies/Results: CT ABDOMEN PELVIS W CONTRAST  Result Date: 03/16/2020 CLINICAL DATA:  Recent endoscopy with duodenal edema and narrowing, possible extrinsic compression EXAM: CT ABDOMEN AND PELVIS WITH CONTRAST TECHNIQUE: Multidetector CT imaging of the abdomen and pelvis was performed using the standard protocol following bolus administration of intravenous contrast. CONTRAST:  OMNIPAQUE IOHEXOL 300 MG/ML  SOLN COMPARISON:  CT from 5 days previous FINDINGS: Lower chest: Small bilateral pleural effusions are noted. No focal infiltrate is seen. Tiny calcified granulomas are noted. Mild right basilar atelectasis is seen. Hepatobiliary: Fatty infiltration of the liver is noted. The gallbladder is well distended and demonstrates dependent density consistent with small  gallstones as previously seen on CT and ultrasound examination. There is however significant new wall thickening of the gallbladder identified highly suspicious for acute cholecystitis. No biliary ductal dilatation is seen. The gallbladder thickening and enlargement causes impingement upon the distal stomach and duodenal bulb similar to that seen on recent endoscopy. Pancreas: Unremarkable. No pancreatic ductal dilatation or surrounding  inflammatory changes. Spleen: Normal in size without focal abnormality. Adrenals/Urinary Tract: Adrenal glands again demonstrate a small right adrenal nodule stable in appearance from the prior exam. The kidneys demonstrate a normal enhancement pattern with multiple cysts identified. No renal calculi or obstructive changes are seen. The bladder is partially distended. Stomach/Bowel: Scattered diverticular change of the colon is noted without evidence of diverticulitis. No obstructive or inflammatory changes are seen. The appendix is within normal limits. Small bowel is within normal limits with the exception of the extrinsic compression upon the duodenal bulb similar to that seen on recent endoscopy. Vascular/Lymphatic: Aortic atherosclerosis. No enlarged abdominal or pelvic lymph nodes. Reproductive: Prostate is unremarkable. Other: Fat containing inguinal hernias left larger than right are again noted. No ascites is noted. Musculoskeletal: No acute or significant osseous findings. IMPRESSION: New changes consistent with gallbladder wall thickening and pericholecystic inflammatory change with known cholelithiasis highly suggestive of acute cholecystitis. Repeat ultrasound or nuclear medicine exam may be helpful for further diagnosis. This causes changes of extrinsic compression upon the duodenal bulb similar to that noted on recent endoscopy. The remainder of the exam is stable from the prior study 5 days previous. Electronically Signed   By: Alcide Clever M.D.   On: 03/16/2020 19:31    Assessment / Plan:   Assessment: 1.  Right sided abdominal pain x4 days: EGD as in HPI with some extrinsic compression, CT yesterday showing new changes consistent with acute cholecystitis 2.  Chronic normocytic anemia 3.  AKI: Creatinine improving  Plan: 1.  Reviewed CT findings with the patient.  Explained that he has acute inflammation of his gallbladder which is likely the cause of all of his symptoms.  Will consult  general surgery. 2.  Please await any further recommendations from Dr. Meridee Score later today.  We will likely sign off.  Thank you for kind consultation.    LOS: 1 day   Unk Lightning  03/17/2020, 9:41 AM

## 2020-03-17 NOTE — Progress Notes (Signed)
PROGRESS NOTE    Allen Torres  NOM:[id number] DOB: [birth year] DOA: 03/14/2020 PCP: Patient, No Pcp Per   Chief Complaint  Patient presents with  . Abdominal Pain   Brief Narrative: 79 year old male with history of CKD unknown stage, hypertension, anemia morbid obesity with BMI 32 cm in the ED with recurrent right-sided abdominal pain.Right-sided abdominal pain for 4 days prior to admission initially seen in the ED 3 days previously with leukocytosis CT abdomen pelvis #2 distal esophageal wall thickening and mild thickening of the stomach and first portion of duodenum, was discharged from ED but came back with ongoing abdominal pain.  Patient is admitted, GI consulted.  Subjective:   Assessment & Plan:  Right-sided abdominal pain Initial CT abdomen pelvis shows distal esophageal wall thickening, otherwise no acute abnormality  Upper endoscopy again shows duodenal inflammation, not congruent with patient's clinical findings  Repeat CT abdomen pelvis shows questionable acute cholecystitis although post endoscopy changes were noted  -consider HIDA scan Surgery be consulted to evaluate for possible evaluation and procedure per their expertise  GI continues to follow, appreciate insight and recommendations  Of note patient is pain-free currently, likely due to n.p.o. status over the past 72 hours  Chronic normocytic anemia  Minimally down trended likely in the setting of hemodilution without overt bleeding  AKI, resolved:  Secondary to above mobile intake intractable   Hyponatremia, hypovolemic:  Suspect volume depletion dehydration.  Stable and improving  Essential hypertension:  Continue on doxazosin and atenolol  Single fever 03/14/2020, without clinical significance Cultures remain negative, patient remains without acute complaints  Obesity with BMI 32  Continue to discuss dietary and weight loss recommendations  Moderate malnutrition  Nutrition Problem: Inadequate oral  intake Etiology: altered GI function Signs/Symptoms: meal completion < 25% Interventions: Boost Breeze, Prostat, MVI  Body mass index is 32.62 kg/m.  DVT prophylaxis:  Code Status:   Code Status: Full Code  Family Communication: plan of care discussed with patient at bedside.  Status is inpatient Patient remains hospitalized for ongoing management of abdominal pain for further evaluation, imaging and possible procedure with GI and surgery.  Dispo: The patient is from: Home with family              Anticipated d/c is to: Home              Anticipated d/c date is: Pending further evaluation/imaging              Patient currently is not medically stable to d/c.  Consultants: GI, general surgery Procedures: Upper endoscopy 03/16/2020  Culture/Microbiology    Component Value Date/Time   SDES  03/14/2020 2011    BLOOD RIGHT ANTECUBITAL Performed at [facility], [address], [place], [place] [address]    SPECREQUEST  03/14/2020 2011    BOTTLES DRAWN AEROBIC AND ANAEROBIC Blood Culture adequate volume Performed at [facility], [address], [place], [place] [address]    CULT  03/14/2020 2011    NO GROWTH 2 DAYS Performed at [facility], [address], [place], [place] [address]    REPTSTATUS PENDING 03/14/2020 2011    Other culture-see note  Medications: Scheduled Meds: . (feeding supplement) PROSource Plus  30 mL Oral BID WC  . allopurinol  200 mg Oral Daily  . atenolol  100 mg Oral Daily  . atenolol  50 mg Oral QHS  . doxazosin  2 mg Oral QPM  . feeding supplement  1 Container  Oral TID BM  . multivitamin with minerals  1 tablet Oral Daily   Continuous Infusions: . sodium chloride 50 mL/hr at 03/16/20 2114    Antimicrobials: Anti-infectives (From admission, onward)   None     Objective: Vitals: Today's Vitals   03/16/20 2116 03/16/20 2144 03/17/20 0603 03/17/20 0650  BP:  (!) 193/82 (!) 202/85 (!) 164/61   Pulse:  71 70 67  Resp:  19 16   Temp:  98.7 F (37.1 C) 98.1 F (36.7 C)   TempSrc:  Oral Oral   SpO2:  97% 98%   Weight:      Height:      PainSc: 0-No pain  0-No pain     Intake/Output Summary (Last 24 hours) at 03/17/2020 7628 Last data filed at 03/17/2020 0118 Gross per 24 hour  Intake 1163.33 ml  Output 0 ml  Net 1163.33 ml   Filed Weights   03/14/20 1115 03/16/20 1351  Weight: 100.2 kg 100.2 kg   Weight change:   Intake/Output from previous day: 12/01 0701 - 12/02 0700 In: 1163.3 [P.O.:360; I.V.:803.3] Out: 0  Intake/Output this shift: No intake/output data recorded.  Examination: General:  Pleasantly resting in bed, No acute distress. HEENT:  Normocephalic atraumatic.  Sclerae nonicteric, noninjected.  Extraocular movements intact bilaterally. Neck:  Without mass or deformity.  Trachea is midline. Lungs:  Clear to auscultate bilaterally without rhonchi, wheeze, or rales. Heart:  Regular rate and rhythm.  Without murmurs, rubs, or gallops. Abdomen:  Soft, nontender, nondistended.  Without guarding or rebound. Extremities: Without cyanosis, clubbing, edema, or obvious deformity. Vascular:  Dorsalis pedis and posterior tibial pulses palpable bilaterally. Skin:  Warm and dry, no erythema, no ulcerations.   Data Reviewed: I have personally reviewed following labs and imaging studies CBC: Recent Labs  Lab 03/11/20 1012 03/14/20 1156 03/15/20 0603 03/16/20 0528  WBC 15.1* 10.0 8.3 9.0  HGB 12.6* 11.0* 9.7* 10.5*  HCT 35.8* 31.3* 27.7* 30.8*  MCV 96.0 95.7 95.5 97.2  PLT 221 199 180 195   Basic Metabolic Panel: Recent Labs  Lab 03/11/20 1012 03/14/20 1156 03/15/20 0603 03/16/20 0528  NA 129* 127* 132* 134*  K 4.4 3.6 3.8 3.9  CL 94* 92* 101 101  CO2 23 23 22  20*  GLUCOSE 171* 178* 76 73  BUN 24* 35* 24* 17  CREATININE 1.23 1.74* 1.40* 1.23  CALCIUM 9.5 8.4* 8.1* 8.5*   GFR: Estimated Creatinine Clearance: 57.8 mL/min (by C-G formula based on  SCr of 1.23 mg/dL). Liver Function Tests: Recent Labs  Lab 03/11/20 1012 03/14/20 1156  AST 40 27  ALT 23 20  ALKPHOS 61 68  BILITOT 1.1 1.2  PROT 7.0 6.5  ALBUMIN 4.5 3.8   Recent Labs  Lab 03/11/20 1012 03/14/20 1156  LIPASE 28 27   No results for input(s): AMMONIA in the last 168 hours. Coagulation Profile: No results for input(s): INR, PROTIME in the last 168 hours. Cardiac Enzymes: No results for input(s): CKTOTAL, CKMB, CKMBINDEX, TROPONINI in the last 168 hours. BNP (last 3 results) No results for input(s): PROBNP in the last 8760 hours. HbA1C: No results for input(s): HGBA1C in the last 72 hours. CBG: No results for input(s): GLUCAP in the last 168 hours. Lipid Profile: No results for input(s): CHOL, HDL, LDLCALC, TRIG, CHOLHDL, LDLDIRECT in the last 72 hours. Thyroid Function Tests: No results for input(s): TSH, T4TOTAL, FREET4, T3FREE, THYROIDAB in the last 72 hours. Anemia Panel: Recent Labs    03/14/20 1156  FERRITIN 489*  TIBC 241*  IRON 19*   Sepsis Labs: No results for input(s): PROCALCITON, LATICACIDVEN in the last 168 hours.  Recent Results (from the past 240 hour(s))  Resp Panel by RT-PCR (Flu A&B, Covid) Nasopharyngeal Swab     Status: None   Collection Time: 03/14/20  2:34 PM   Specimen: Nasopharyngeal Swab; Nasopharyngeal(NP) swabs in vial transport medium  Result Value Ref Range Status   SARS Coronavirus 2 by RT PCR NEGATIVE NEGATIVE Final    Comment: (NOTE) SARS-CoV-2 target nucleic acids are NOT DETECTED.  The SARS-CoV-2 RNA is generally detectable in upper respiratory specimens during the acute phase of infection. The lowest concentration of SARS-CoV-2 viral copies this assay can detect is 138 copies/mL. A negative result does not preclude SARS-Cov-2 infection and should not be used as the sole basis for treatment or other patient management decisions. A negative result may occur with  improper specimen collection/handling,  submission of specimen other than nasopharyngeal swab, presence of viral mutation(s) within the areas targeted by this assay, and inadequate number of viral copies(<138 copies/mL). A negative result must be combined with clinical observations, patient history, and epidemiological information. The expected result is Negative.  Fact Sheet for Patients:  BloggerCourse.comhttps://www.fda.gov/media/152166/download  Fact Sheet for Healthcare Providers:  SeriousBroker.ithttps://www.fda.gov/media/152162/download  This test is no t yet approved or cleared by the [place] FDA and  has been authorized for detection and/or diagnosis of SARS-CoV-2 by FDA under an Emergency Use Authorization (EUA). This EUA will remain  in effect (meaning this test can be used) for the duration of the COVID-19 declaration under Section 564(b)(1) of the Act, 21 U.S.C.section 360bbb-3(b)(1), unless the authorization is terminated  or revoked sooner.       Influenza A by PCR NEGATIVE NEGATIVE Final   Influenza B by PCR NEGATIVE NEGATIVE Final    Comment: (NOTE) The Xpert Xpress SARS-CoV-2/FLU/RSV plus assay is intended as an aid in the diagnosis of influenza from Nasopharyngeal swab specimens and should not be used as a sole basis for treatment. Nasal washings and aspirates are unacceptable for Xpert Xpress SARS-CoV-2/FLU/RSV testing.  Fact Sheet for Patients: BloggerCourse.comhttps://www.fda.gov/media/152166/download  Fact Sheet for Healthcare Providers: SeriousBroker.ithttps://www.fda.gov/media/152162/download  This test is not yet approved or cleared by the [place] FDA and has been authorized for detection and/or diagnosis of SARS-CoV-2 by FDA under an Emergency Use Authorization (EUA). This EUA will remain in effect (meaning this test can be used) for the duration of the COVID-19 declaration under Section 564(b)(1) of the Act, 21 U.S.C. section 360bbb-3(b)(1), unless the authorization is terminated or revoked.  Performed at [facility], [address] [place], [place] [address]   Culture, blood (routine x 2)     Status: None (Preliminary result)   Collection Time: 03/14/20  8:10 PM   Specimen: BLOOD  Result Value Ref Range Status   Specimen Description   Final    BLOOD LEFT ANTECUBITAL Performed at [facility], [address] [place], [place] [address]    Special Requests   Final    BOTTLES DRAWN AEROBIC AND ANAEROBIC Blood Culture adequate volume Performed at [facility], [address] [place], [place] [address]    Culture   Final    NO GROWTH 2 DAYS Performed at [facility], [address] [place], [place] [address]    Report Status PENDING  Incomplete  Culture, blood (routine x 2)     Status: None (Preliminary result)  Collection Time: 03/14/20  8:11 PM   Specimen: BLOOD  Result Value Ref Range Status   Specimen Description   Final    BLOOD RIGHT ANTECUBITAL Performed at [facility], [address], [place], [place] [address]    Special Requests   Final    BOTTLES DRAWN AEROBIC AND ANAEROBIC Blood Culture adequate volume Performed at [facility], [address], [place], [place] [address]    Culture   Final    NO GROWTH 2 DAYS Performed at [facility], [address], [place], [place] [address]    Report Status PENDING  Incomplete     Radiology Studies: CT ABDOMEN PELVIS W CONTRAST  Result Date: 03/16/2020 CLINICAL DATA:  Recent endoscopy with duodenal edema and narrowing, possible extrinsic compression EXAM: CT ABDOMEN AND PELVIS WITH CONTRAST TECHNIQUE: Multidetector CT imaging of the abdomen and pelvis was performed using the standard protocol following bolus administration of intravenous contrast. CONTRAST:  OMNIPAQUE IOHEXOL 300 MG/ML  SOLN COMPARISON:  CT from 5 days previous FINDINGS: Lower chest: Small bilateral pleural effusions are noted. No focal infiltrate is seen. Tiny  calcified granulomas are noted. Mild right basilar atelectasis is seen. Hepatobiliary: Fatty infiltration of the liver is noted. The gallbladder is well distended and demonstrates dependent density consistent with small gallstones as previously seen on CT and ultrasound examination. There is however significant new wall thickening of the gallbladder identified highly suspicious for acute cholecystitis. No biliary ductal dilatation is seen. The gallbladder thickening and enlargement causes impingement upon the distal stomach and duodenal bulb similar to that seen on recent endoscopy. Pancreas: Unremarkable. No pancreatic ductal dilatation or surrounding inflammatory changes. Spleen: Normal in size without focal abnormality. Adrenals/Urinary Tract: Adrenal glands again demonstrate a small right adrenal nodule stable in appearance from the prior exam. The kidneys demonstrate a normal enhancement pattern with multiple cysts identified. No renal calculi or obstructive changes are seen. The bladder is partially distended. Stomach/Bowel: Scattered diverticular change of the colon is noted without evidence of diverticulitis. No obstructive or inflammatory changes are seen. The appendix is within normal limits. Small bowel is within normal limits with the exception of the extrinsic compression upon the duodenal bulb similar to that seen on recent endoscopy. Vascular/Lymphatic: Aortic atherosclerosis. No enlarged abdominal or pelvic lymph nodes. Reproductive: Prostate is unremarkable. Other: Fat containing inguinal hernias left larger than right are again noted. No ascites is noted. Musculoskeletal: No acute or significant osseous findings. IMPRESSION: New changes consistent with gallbladder wall thickening and pericholecystic inflammatory change with known cholelithiasis highly suggestive of acute cholecystitis. Repeat ultrasound or nuclear medicine exam may be helpful for further diagnosis. This causes changes of extrinsic  compression upon the duodenal bulb similar to that noted on recent endoscopy. The remainder of the exam is stable from the prior study 5 days previous. Electronically Signed   By: Alcide Clever M.D.   On: 03/16/2020 19:31     LOS: 1 day   Azucena Fallen, DO [facility]  03/17/2020, 7:02 AM

## 2020-03-17 NOTE — Consult Note (Signed)
Consult Note  Allen Torres Allen Torres 04/18/1940  [id number].    Requesting MD: Mansouraty  Chief Complaint/Reason for Consult: Cholelithiasis HPI:  Patient is a 79 year old male who presented to [facility] initially on 11/26 with abdominal pain, nausea and vomiting. He reported vomiting several times 11/25-11/26 but was unable to see whether emesis was bloody or bilious. He reported pain was in the RUQ and felt twisting in nature and radiated to RLQ as well. He denied fever, chills, diarrhea, chest pain, SOB. He did report some diaphoresis when pain was severe. He had RUQ US and CT A/P at that time that showed cholelithiasis but not suggestive of cholecystitis, but also showed concern for gastroduodenitis. Patient's pain resolved in the ED and he tolerated PO challenge. He was discharged from the ED with strict return precautions and elective general surgery follow up for cholelithiasis. He returned to the ED 11/29 with recurrent pain and decreased PO intake. Patient was unable to discern whether pain was specifically brought on by PO intake. GI did EGD yesterday which showed duodenal edema with luminal narrowing, although no obvious ulceration/mass lesion/or active obstruction, unclear if this finding is caused or related to his present symptoms and chronicity of this, possibly could have prior scarring of history of duodenal ulcer versus component of extrinsic compression. CT repeated yesterday as well which showed increased GB wall thickening.  Pain currently minimal, and patient denies nausea and vomiting. He is passing flatus. Patient is afebrile.   PMH otherwise significant for HTN, CKD, anemia and gout. He has not had any past abdominal surgery. He denies alcohol, tobacco or illicit drug use. Intolerances listed to colchicine, indomethacin and statins.   ROS: Review of Systems  Constitutional: Positive for diaphoresis. Negative for chills and fever.  Respiratory: Negative for shortness of breath and  wheezing.   Cardiovascular: Negative for chest pain and palpitations.  Gastrointestinal: Positive for abdominal pain, nausea and vomiting. Negative for blood in stool, constipation, diarrhea and melena.  Genitourinary: Negative for dysuria, frequency and urgency.  All other systems reviewed and are negative.   History reviewed. No pertinent family history.  Past Medical History:  Diagnosis Date  . Hypertension   . Renal disorder    Patient states, "chronic kidney disease"    Past Surgical History:  Procedure Laterality Date  . facial cystectomy      Social History:  reports that he has quit smoking. He has never used smokeless tobacco. He reports that he does not drink alcohol and does not use drugs.  Allergies:  Allergies  Allergen Reactions  . Colchicine Diarrhea  . Indocin [Indomethacin]     "messed up my liver"  . Statins Other (See Comments)    Patient states that he hurt so bad, hard to get up in the morning.  Felt like his system was shut down and under able to have a bowel movement    Medications Prior to Admission  Medication Sig Dispense Refill  . acetaminophen (TYLENOL) 500 MG tablet Take 1,000 mg by mouth every 6 (six) hours as needed for moderate pain.    Marland Kitchen. allopurinol (ZYLOPRIM) 100 MG tablet Take 200 mg by mouth daily.    . Ascorbic Acid (VITAMIN C) 1000 MG tablet Take 1,000 mg by mouth daily.    Marland Kitchen. atenolol (TENORMIN) 100 MG tablet Take 50-100 mg by mouth 2 (two) times daily. Take 1 tablet (100 mg) in the morning and 1/2 tablet (50 mg) at bedtime    .  cholecalciferol (VITAMIN D3) 25 MCG (1000 UNIT) tablet Take 1,000 Units by mouth daily.    Marland Kitchen doxazosin (CARDURA) 2 MG tablet Take 2 mg by mouth every evening.     . fluticasone (FLONASE) 50 MCG/ACT nasal spray Place 1 spray into both nostrils daily as needed for allergies.     . hydrochlorothiazide (MICROZIDE) 12.5 MG capsule Take 12.5 mg by mouth daily.    Marland Kitchen HYDROcodone-acetaminophen (NORCO/VICODIN) 5-325 MG  tablet Take 1-2 tablets by mouth every 6 (six) hours as needed. (Patient taking differently: Take 1-2 tablets by mouth every 6 (six) hours as needed for moderate pain. ) 10 tablet 0  . lisinopril (ZESTRIL) 40 MG tablet Take 40 mg by mouth daily.     . magnesium 30 MG tablet Take 30 mg by mouth daily.     . vitamin B-12 (CYANOCOBALAMIN) 100 MCG tablet Take 100 mcg by mouth daily.    Marland Kitchen zinc gluconate 50 MG tablet Take 50 mg by mouth daily.    . ondansetron (ZOFRAN ODT) 4 MG disintegrating tablet Take 1 tablet (4 mg total) by mouth every 8 (eight) hours as needed for nausea or vomiting. 6 tablet 0    Blood pressure (!) 165/77, pulse 70, temperature 98.4 F (36.9 C), temperature source Oral, resp. rate 16, height 5\' 9"  (1.753 m), weight 100.2 kg, SpO2 96 %. Physical Exam: General: pleasant, WD, obese male who is laying in bed in NAD HEENT: head is normocephalic, atraumatic.  Sclera are anicteric.  PERRL.  Ears and nose without any masses or lesions.  Mouth is pink and moist Heart: regular, rate, and rhythm.  Normal s1,s2. No obvious murmurs, gallops, or rubs noted.  Palpable radial and pedal pulses bilaterally Lungs: CTAB, no wheezes, rhonchi, or rales noted.  Respiratory effort nonlabored Abd: soft, very mildly ttp in RUQ but negative murphy sign, ND, +BS, no masses, hernias, or organomegaly MS: all 4 extremities are symmetrical with no cyanosis, clubbing, or edema. Skin: warm and dry with no masses, lesions, or rashes Neuro: Cranial nerves 2-12 grossly intact, sensation is normal throughout Psych: A&Ox3 with an appropriate affect.   Results for orders placed or performed during the hospital encounter of 03/14/20 (from the past 48 hour(s))  CBC     Status: Abnormal   Collection Time: 03/16/20  5:28 AM  Result Value Ref Range   WBC 9.0 4.0 - 10.5 K/uL   RBC 3.17 (Allen) 4.22 - 5.81 MIL/uL   Hemoglobin 10.5 (Allen) 13.0 - 17.0 g/dL   HCT 14/01/21 (Allen) 39 - 52 %   MCV 97.2 80.0 - 100.0 fL   MCH 33.1 26.0  - 34.0 pg   MCHC 34.1 30.0 - 36.0 g/dL   RDW 78.4 69.6 - 29.5 %   Platelets 195 150 - 400 K/uL   nRBC 0.0 0.0 - 0.2 %    Comment: Performed at [facility], [address], [place], [place]  Basic metabolic panel     Status: Abnormal   Collection Time: 03/16/20  5:28 AM  Result Value Ref Range   Sodium 134 (Allen) 135 - 145 mmol/Allen   Potassium 3.9 3.5 - 5.1 mmol/Allen   Chloride 101 98 - 111 mmol/Allen   CO2 20 (Allen) 22 - 32 mmol/Allen   Glucose, Bld 73 70 - 99 mg/dL    Comment: Glucose reference range applies only to samples taken after fasting for at least 8 hours.   BUN 17 8 - 23 mg/dL   Creatinine,  Ser 1.23 0.61 - 1.24 mg/dL   Calcium 8.5 (Allen) 8.9 - 10.3 mg/dL   GFR, Estimated >88 >82 mL/min    Comment: (NOTE) Calculated using the CKD-EPI Creatinine Equation (2021)    Anion gap 13 5 - 15    Comment: Performed at [facility], [address] [place], [place] [address]  CBC     Status: Abnormal   Collection Time: 03/17/20  9:40 AM  Result Value Ref Range   WBC 10.3 4.0 - 10.5 K/uL   RBC 3.26 (Allen) 4.22 - 5.81 MIL/uL   Hemoglobin 10.9 (Allen) 13.0 - 17.0 g/dL   HCT 91.7 (Allen) 39 - 52 %   MCV 95.1 80.0 - 100.0 fL   MCH 33.4 26.0 - 34.0 pg   MCHC 35.2 30.0 - 36.0 g/dL   RDW 91.5 05.6 - 97.9 %   Platelets 223 150 - 400 K/uL   nRBC 0.0 0.0 - 0.2 %    Comment: Performed at [facility], [address] [place], [place] [address]  Basic metabolic panel     Status: Abnormal   Collection Time: 03/17/20  9:40 AM  Result Value Ref Range   Sodium 131 (Allen) 135 - 145 mmol/Allen   Potassium 3.7 3.5 - 5.1 mmol/Allen   Chloride 97 (Allen) 98 - 111 mmol/Allen   CO2 18 (Allen) 22 - 32 mmol/Allen   Glucose, Bld 80 70 - 99 mg/dL    Comment: Glucose reference range applies only to samples taken after fasting for at least 8 hours.   BUN 16 8 - 23 mg/dL   Creatinine, Ser 5.53 0.61 - 1.24 mg/dL   Calcium 8.5 (Allen) 8.9 - 10.3 mg/dL   GFR, Estimated >74 >82 mL/min    Comment:  (NOTE) Calculated using the CKD-EPI Creatinine Equation (2021)    Anion gap 16 (H) 5 - 15    Comment: Performed at [facility], [address] [place], [place] [address]   CT ABDOMEN PELVIS W CONTRAST  Result Date: 03/16/2020 CLINICAL DATA:  Recent endoscopy with duodenal edema and narrowing, possible extrinsic compression EXAM: CT ABDOMEN AND PELVIS WITH CONTRAST TECHNIQUE: Multidetector CT imaging of the abdomen and pelvis was performed using the standard protocol following bolus administration of intravenous contrast. CONTRAST:  OMNIPAQUE IOHEXOL 300 MG/ML  SOLN COMPARISON:  CT from 5 days previous FINDINGS: Lower chest: Small bilateral pleural effusions are noted. No focal infiltrate is seen. Tiny calcified granulomas are noted. Mild right basilar atelectasis is seen. Hepatobiliary: Fatty infiltration of the liver is noted. The gallbladder is well distended and demonstrates dependent density consistent with small gallstones as previously seen on CT and ultrasound examination. There is however significant new wall thickening of the gallbladder identified highly suspicious for acute cholecystitis. No biliary ductal dilatation is seen. The gallbladder thickening and enlargement causes impingement upon the distal stomach and duodenal bulb similar to that seen on recent endoscopy. Pancreas: Unremarkable. No pancreatic ductal dilatation or surrounding inflammatory changes. Spleen: Normal in size without focal abnormality. Adrenals/Urinary Tract: Adrenal glands again demonstrate a small right adrenal nodule stable in appearance from the prior exam. The kidneys demonstrate a normal enhancement pattern with multiple cysts identified. No renal calculi or obstructive changes are seen. The bladder is partially distended. Stomach/Bowel: Scattered diverticular change of the colon is noted without evidence of diverticulitis. No obstructive or inflammatory changes are seen. The appendix is  within normal limits. Small bowel is within normal limits with the exception  of the extrinsic compression upon the duodenal bulb similar to that seen on recent endoscopy. Vascular/Lymphatic: Aortic atherosclerosis. No enlarged abdominal or pelvic lymph nodes. Reproductive: Prostate is unremarkable. Other: Fat containing inguinal hernias left larger than right are again noted. No ascites is noted. Musculoskeletal: No acute or significant osseous findings. IMPRESSION: New changes consistent with gallbladder wall thickening and pericholecystic inflammatory change with known cholelithiasis highly suggestive of acute cholecystitis. Repeat ultrasound or nuclear medicine exam may be helpful for further diagnosis. This causes changes of extrinsic compression upon the duodenal bulb similar to that noted on recent endoscopy. The remainder of the exam is stable from the prior study 5 days previous. Electronically Signed   By: Alcide Clever M.D.   On: 03/16/2020 19:31      Assessment/Plan HTN AKI on CKD - Cr 1.2 today  Chronic normocytic anemia - hgb 10.9 this AM Gout  Esophagitis/gastritis - s/p EGD 12/1 with duodenal edema with luminal narrowing, although no obvious ulceration/mass lesion/or active obstruction, unclear if this finding is caused or related to his present symptoms and chronicity of this, possibly could have prior scarring of history of duodenal ulcer versus component of extrinsic compression  RUQ pain, cholelithiasis Possible cholecystitis  - RUQ [place] and CT A/P 11/26 equivocal for cholecystitis  - WBC was 15 on admit, normalized now - LFTs were not elevated - recheck today - patient is very mildly tender in RUQ, nausea and vomiting have resolved - recommend HIDA today - if positive, will plan for laparoscopic cholecystectomy tomorrow - if HIDA negative, I would recommend PO challenge   FEN: NPO, IVF VTE: SCDs ID: no current abx  Juliet Rude, [facility] Surgery 03/17/2020,  11:33 AM Please see Amion for pager number during day hours 7:00am-4:30pm

## 2020-03-18 ENCOUNTER — Inpatient Hospital Stay (HOSPITAL_COMMUNITY): Payer: Medicare HMO

## 2020-03-18 ENCOUNTER — Encounter (HOSPITAL_COMMUNITY): Payer: Self-pay | Admitting: Internal Medicine

## 2020-03-18 ENCOUNTER — Inpatient Hospital Stay (HOSPITAL_COMMUNITY): Payer: Medicare HMO | Admitting: Registered Nurse

## 2020-03-18 ENCOUNTER — Encounter (HOSPITAL_COMMUNITY)
Admission: EM | Disposition: A | Discharge status: Home / Self Care | Payer: Self-pay | Source: Home / Self Care | Attending: Internal Medicine

## 2020-03-18 DIAGNOSIS — K811 Chronic cholecystitis: Secondary | ICD-10-CM

## 2020-03-18 HISTORY — PX: CHOLECYSTECTOMY: SHX55

## 2020-03-18 HISTORY — DX: Chronic cholecystitis: K81.1

## 2020-03-18 LAB — CBC
HCT: 30.7 % — ABNORMAL LOW (ref 39.0–52.0)
Hemoglobin: 10.6 g/dL — ABNORMAL LOW (ref 13.0–17.0)
MCH: 33.1 pg (ref 26.0–34.0)
MCHC: 34.5 g/dL (ref 30.0–36.0)
MCV: 95.9 fL (ref 80.0–100.0)
Platelets: 232 10*3/uL (ref 150–400)
RBC: 3.2 MIL/uL — ABNORMAL LOW (ref 4.22–5.81)
RDW: 13.5 % (ref 11.5–15.5)
WBC: 9.6 10*3/uL (ref 4.0–10.5)
nRBC: 0 % (ref 0.0–0.2)

## 2020-03-18 LAB — COMPREHENSIVE METABOLIC PANEL
ALT: 24 U/L (ref 0–44)
AST: 33 U/L (ref 15–41)
Albumin: 3.2 g/dL — ABNORMAL LOW (ref 3.5–5.0)
Alkaline Phosphatase: 74 U/L (ref 38–126)
Anion gap: 15 (ref 5–15)
BUN: 15 mg/dL (ref 8–23)
CO2: 19 mmol/L — ABNORMAL LOW (ref 22–32)
Calcium: 8.5 mg/dL — ABNORMAL LOW (ref 8.9–10.3)
Chloride: 100 mmol/L (ref 98–111)
Creatinine, Ser: 1.1 mg/dL (ref 0.61–1.24)
GFR, Estimated: >60 mL/min (ref >60)
Glucose, Bld: 65 mg/dL — ABNORMAL LOW (ref 70–99)
Potassium: 3.9 mmol/L (ref 3.5–5.1)
Sodium: 134 mmol/L — ABNORMAL LOW (ref 135–145)
Total Bilirubin: 1.4 mg/dL — ABNORMAL HIGH (ref 0.3–1.2)
Total Protein: 5.7 g/dL — ABNORMAL LOW (ref 6.5–8.1)

## 2020-03-18 SURGERY — LAPAROSCOPIC CHOLECYSTECTOMY WITH INTRAOPERATIVE CHOLANGIOGRAM
Anesthesia: General | Site: Abdomen

## 2020-03-18 MED ORDER — LISINOPRIL 20 MG PO TABS
40.0000 mg | ORAL_TABLET | Freq: Every day | ORAL | Status: DC
  Administered 2020-03-18: 40 mg via ORAL
  Filled 2020-03-18 (×2): qty 2

## 2020-03-18 MED ORDER — LIDOCAINE HCL (PF) 2 % IJ SOLN
INTRAMUSCULAR | Status: AC
  Filled 2020-03-18: qty 5

## 2020-03-18 MED ORDER — PROPOFOL 10 MG/ML IV BOLUS
INTRAVENOUS | Status: DC | PRN
  Administered 2020-03-18: 100 mg via INTRAVENOUS

## 2020-03-18 MED ORDER — LIDOCAINE 2% (20 MG/ML) 5 ML SYRINGE
INTRAMUSCULAR | Status: DC | PRN
  Administered 2020-03-18: 80 mg via INTRAVENOUS

## 2020-03-18 MED ORDER — PANTOPRAZOLE SODIUM 40 MG PO TBEC
40.0000 mg | DELAYED_RELEASE_TABLET | Freq: Every day | ORAL | Status: DC
  Administered 2020-03-19: 40 mg via ORAL
  Filled 2020-03-18: qty 1

## 2020-03-18 MED ORDER — SUGAMMADEX SODIUM 200 MG/2ML IV SOLN
INTRAVENOUS | Status: DC | PRN
  Administered 2020-03-18: 200 mg via INTRAVENOUS

## 2020-03-18 MED ORDER — RINGERS IRRIGATION IR SOLN
Status: DC | PRN
  Administered 2020-03-18: 1000 mL

## 2020-03-18 MED ORDER — ONDANSETRON HCL 4 MG/2ML IJ SOLN: 4.0000 mg | Freq: Once | INTRAMUSCULAR | Status: DC | PRN

## 2020-03-18 MED ORDER — EPHEDRINE 5 MG/ML INJ
INTRAVENOUS | Status: AC
  Filled 2020-03-18: qty 10

## 2020-03-18 MED ORDER — EPHEDRINE SULFATE-NACL 50-0.9 MG/10ML-% IV SOSY
PREFILLED_SYRINGE | INTRAVENOUS | Status: DC | PRN
  Administered 2020-03-18: 5 mg via INTRAVENOUS

## 2020-03-18 MED ORDER — BUPIVACAINE LIPOSOME 1.3 % IJ SUSP
20.0000 mL | Freq: Once | INTRAMUSCULAR | Status: AC
  Administered 2020-03-18: 20 mL
  Filled 2020-03-18: qty 20

## 2020-03-18 MED ORDER — HYDROMORPHONE HCL 1 MG/ML IJ SOLN
INTRAMUSCULAR | Status: AC
  Filled 2020-03-18: qty 1

## 2020-03-18 MED ORDER — ONDANSETRON HCL 4 MG/2ML IJ SOLN
INTRAMUSCULAR | Status: AC
  Filled 2020-03-18: qty 2

## 2020-03-18 MED ORDER — FENTANYL CITRATE (PF) 100 MCG/2ML IJ SOLN
INTRAMUSCULAR | Status: DC | PRN
  Administered 2020-03-18: 100 ug via INTRAVENOUS

## 2020-03-18 MED ORDER — CHLORHEXIDINE GLUCONATE 0.12 % MT SOLN
15.0000 mL | Freq: Once | OROMUCOSAL | Status: AC
  Administered 2020-03-18: 15 mL via OROMUCOSAL

## 2020-03-18 MED ORDER — ACETAMINOPHEN 500 MG PO TABS
1000.0000 mg | ORAL_TABLET | ORAL | Status: AC
  Administered 2020-03-18: 1000 mg via ORAL
  Filled 2020-03-18: qty 2

## 2020-03-18 MED ORDER — ONDANSETRON HCL 4 MG/2ML IJ SOLN
INTRAMUSCULAR | Status: DC | PRN
  Administered 2020-03-18: 4 mg via INTRAVENOUS

## 2020-03-18 MED ORDER — ROCURONIUM BROMIDE 10 MG/ML (PF) SYRINGE
PREFILLED_SYRINGE | INTRAVENOUS | Status: AC
  Filled 2020-03-18: qty 10

## 2020-03-18 MED ORDER — HYDROCHLOROTHIAZIDE 12.5 MG PO CAPS
12.5000 mg | ORAL_CAPSULE | Freq: Every day | ORAL | Status: DC
  Administered 2020-03-18: 12.5 mg via ORAL
  Filled 2020-03-18: qty 1

## 2020-03-18 MED ORDER — ROCURONIUM BROMIDE 10 MG/ML (PF) SYRINGE
PREFILLED_SYRINGE | INTRAVENOUS | Status: DC | PRN
  Administered 2020-03-18: 80 mg via INTRAVENOUS

## 2020-03-18 MED ORDER — SCOPOLAMINE 1 MG/3DAYS TD PT72
1.0000 | MEDICATED_PATCH | TRANSDERMAL | Status: DC
  Administered 2020-03-18: 1.5 mg via TRANSDERMAL
  Filled 2020-03-18: qty 1

## 2020-03-18 MED ORDER — DEXAMETHASONE SODIUM PHOSPHATE 10 MG/ML IJ SOLN
INTRAMUSCULAR | Status: DC | PRN
  Administered 2020-03-18: 8 mg via INTRAVENOUS

## 2020-03-18 MED ORDER — FENTANYL CITRATE (PF) 100 MCG/2ML IJ SOLN
INTRAMUSCULAR | Status: AC
  Filled 2020-03-18: qty 2

## 2020-03-18 MED ORDER — HYDROMORPHONE HCL 1 MG/ML IJ SOLN
0.2500 mg | INTRAMUSCULAR | Status: DC | PRN
  Administered 2020-03-18: 0.25 mg via INTRAVENOUS
  Administered 2020-03-18: 0.5 mg via INTRAVENOUS
  Administered 2020-03-18: 0.25 mg via INTRAVENOUS

## 2020-03-18 MED ORDER — DEXAMETHASONE SODIUM PHOSPHATE 10 MG/ML IJ SOLN
INTRAMUSCULAR | Status: AC
  Filled 2020-03-18: qty 1

## 2020-03-18 MED ORDER — PHENYLEPHRINE 40 MCG/ML (10ML) SYRINGE FOR IV PUSH (FOR BLOOD PRESSURE SUPPORT)
PREFILLED_SYRINGE | INTRAVENOUS | Status: AC
  Filled 2020-03-18: qty 10

## 2020-03-18 MED ORDER — 0.9 % SODIUM CHLORIDE (POUR BTL) OPTIME
TOPICAL | Status: DC | PRN
  Administered 2020-03-18: 1000 mL

## 2020-03-18 MED ORDER — SODIUM CHLORIDE 0.9 % IV SOLN
2.0000 g | INTRAVENOUS | Status: DC
  Filled 2020-03-18: qty 20

## 2020-03-18 MED ORDER — LACTATED RINGERS IV SOLN: INTRAVENOUS | Status: DC

## 2020-03-18 MED ORDER — MEPERIDINE HCL 50 MG/ML IJ SOLN: 6.2500 mg | INTRAMUSCULAR | Status: DC | PRN

## 2020-03-18 MED ORDER — GABAPENTIN 300 MG PO CAPS
300.0000 mg | ORAL_CAPSULE | ORAL | Status: AC
  Administered 2020-03-18: 300 mg via ORAL
  Filled 2020-03-18: qty 1

## 2020-03-18 SURGICAL SUPPLY — 41 items
APPLICATOR COTTON TIP 6 STRL (MISCELLANEOUS) ×2 IMPLANT
APPLICATOR COTTON TIP 6IN STRL (MISCELLANEOUS) ×6 IMPLANT
APPLIER CLIP 5 13 M/L LIGAMAX5 (MISCELLANEOUS)
APPLIER CLIP ROT 10 11.4 M/L (STAPLE)
BENZOIN TINCTURE PRP APPL 2/3 (GAUZE/BANDAGES/DRESSINGS) IMPLANT
CABLE HIGH FREQUENCY MONO STRZ (ELECTRODE) IMPLANT
CATH REDDICK CHOLANGI 4FR 50CM (CATHETERS) ×3 IMPLANT
CLIP APPLIE 5 13 M/L LIGAMAX5 (MISCELLANEOUS) IMPLANT
CLIP APPLIE ROT 10 11.4 M/L (STAPLE) IMPLANT
CLOSURE WOUND 1/2 X4 (GAUZE/BANDAGES/DRESSINGS)
COVER MAYO STAND STRL (DRAPES) ×3 IMPLANT
COVER SURGICAL LIGHT HANDLE (MISCELLANEOUS) ×3 IMPLANT
COVER WAND RF STERILE (DRAPES) IMPLANT
DECANTER SPIKE VIAL GLASS SM (MISCELLANEOUS) ×3 IMPLANT
DERMABOND ADVANCED (GAUZE/BANDAGES/DRESSINGS) ×2
DERMABOND ADVANCED .7 DNX12 (GAUZE/BANDAGES/DRESSINGS) ×1 IMPLANT
DRAPE C-ARM 42X120 X-RAY (DRAPES) ×3 IMPLANT
ELECT L-HOOK LAP 45CM DISP (ELECTROSURGICAL)
ELECT PENCIL ROCKER SW 15FT (MISCELLANEOUS) IMPLANT
ELECT REM PT RETURN 15FT ADLT (MISCELLANEOUS) ×3 IMPLANT
ELECTRODE L-HOOK LAP 45CM DISP (ELECTROSURGICAL) IMPLANT
GLOVE BIOGEL M 8.0 STRL (GLOVE) ×3 IMPLANT
GOWN STRL REUS W/TWL XL LVL3 (GOWN DISPOSABLE) ×3 IMPLANT
HEMOSTAT SURGICEL 4X8 (HEMOSTASIS) IMPLANT
IV CATH 14GX2 1/4 (CATHETERS) ×3 IMPLANT
KIT BASIN OR (CUSTOM PROCEDURE TRAY) ×3 IMPLANT
KIT TURNOVER KIT A (KITS) IMPLANT
POUCH RETRIEVAL ECOSAC 10 (ENDOMECHANICALS) IMPLANT
POUCH RETRIEVAL ECOSAC 10MM (ENDOMECHANICALS) ×2
SCISSORS LAP 5X45 EPIX DISP (ENDOMECHANICALS) ×3 IMPLANT
SET IRRIG TUBING LAPAROSCOPIC (IRRIGATION / IRRIGATOR) ×3 IMPLANT
SET TUBE SMOKE EVAC HIGH FLOW (TUBING) ×3 IMPLANT
SLEEVE XCEL OPT CAN 5 100 (ENDOMECHANICALS) ×3 IMPLANT
STRIP CLOSURE SKIN 1/2X4 (GAUZE/BANDAGES/DRESSINGS) IMPLANT
SUT MNCRL AB 4-0 PS2 18 (SUTURE) ×6 IMPLANT
SYR 20ML LL LF (SYRINGE) ×3 IMPLANT
TOWEL OR 17X26 10 PK STRL BLUE (TOWEL DISPOSABLE) ×3 IMPLANT
TRAY LAPAROSCOPIC (CUSTOM PROCEDURE TRAY) ×3 IMPLANT
TROCAR BLADELESS OPT 5 100 (ENDOMECHANICALS) ×3 IMPLANT
TROCAR XCEL BLUNT TIP 100MML (ENDOMECHANICALS) IMPLANT
TROCAR XCEL NON-BLD 11X100MML (ENDOMECHANICALS) IMPLANT

## 2020-03-18 NOTE — Progress Notes (Signed)
Progress Note  2 Days Post-Op  Subjective: Patient reports persistent mild RUQ pain. Denies nausea. Discussed results of HIDA and patient wishes to proceed with surgery. I spoke with his daughter on the phone as well.   Objective: Vital signs in last 24 hours: Temp:  [97.8 F (36.6 C)-99.1 F (37.3 C)] 99 F (37.2 C) (12/03 0641) Pulse Rate:  [64-75] 73 (12/03 0641) Resp:  [15-20] 18 (12/03 0641) BP: (150-190)/(54-84) 150/58 (12/03 0641) SpO2:  [92 %-96 %] 95 % (12/03 0641) Last BM Date: 03/17/20  Intake/Output from previous day: 12/02 0701 - 12/03 0700 In: 1229.8 [P.O.:360; I.V.:869.8] Out: -  Intake/Output this shift: No intake/output data recorded.  PE: General: pleasant, WD, obese male who is laying in bed in NAD HEENT: head is normocephalic, atraumatic.  Sclera are anicteric.  PERRL.  Ears and nose without any masses or lesions.  Mouth is pink and moist Heart: regular, rate, and rhythm.  Normal s1,s2. No obvious murmurs, gallops, or rubs noted.  Palpable radial and pedal pulses bilaterally Lungs: CTAB, no wheezes, rhonchi, or rales noted.  Respiratory effort nonlabored Abd: soft, very mildly ttp in RUQ but negative murphy sign, ND, +BS, no masses, hernias, or organomegaly MS: all 4 extremities are symmetrical with no cyanosis, clubbing, or edema. Skin: warm and dry with no masses, lesions, or rashes Neuro: Cranial nerves 2-12 grossly intact, sensation is normal throughout Psych: A&Ox3 with an appropriate affect.   Lab Results:  Recent Labs    03/17/20 0940 03/18/20 0520  WBC 10.3 9.6  HGB 10.9* 10.6*  HCT 31.0* 30.7*  PLT 223 232   BMET Recent Labs    03/17/20 0940 03/18/20 0520  NA 131* 134*  K 3.7 3.9  CL 97* 100  CO2 18* 19*  GLUCOSE 80 65*  BUN 16 15  CREATININE 1.20 1.10  CALCIUM 8.5* 8.5*   PT/INR No results for input(s): LABPROT, INR in the last 72 hours. CMP     Component Value Date/Time   NA 134 (L) 03/18/2020 0520   K 3.9  03/18/2020 0520   CL 100 03/18/2020 0520   CO2 19 (L) 03/18/2020 0520   GLUCOSE 65 (L) 03/18/2020 0520   BUN 15 03/18/2020 0520   CREATININE 1.10 03/18/2020 0520   CALCIUM 8.5 (L) 03/18/2020 0520   PROT 5.7 (L) 03/18/2020 0520   ALBUMIN 3.2 (L) 03/18/2020 0520   AST 33 03/18/2020 0520   ALT 24 03/18/2020 0520   ALKPHOS 74 03/18/2020 0520   BILITOT 1.4 (H) 03/18/2020 0520   GFRNONAA >60 03/18/2020 0520   Lipase     Component Value Date/Time   LIPASE 27 03/14/2020 1156       Studies/Results: NM Hepatobiliary Liver Func  Result Date: 03/17/2020 CLINICAL DATA:  Rule out cholecystitis. EXAM: NUCLEAR MEDICINE HEPATOBILIARY IMAGING TECHNIQUE: Sequential images of the abdomen were obtained out to 60 minutes following intravenous administration of radiopharmaceutical. RADIOPHARMACEUTICALS:  5.5 mCi Tc-63m  Choletec IV COMPARISON:  03/16/2020 FINDINGS: Prompt uptake and biliary excretion of activity by the liver is seen. Biliary activity passes into small bowel, consistent with patent common bile duct. After 1 hour no gallbladder activity was visualized. The patient subsequently received 3 mg of morphine and imaging was carried out for an additional 30 minutes with filling of the gallbladder. IMPRESSION: 1. Delayed filling of the gallbladder after 1 hour and after administration of morphine sulfate. 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 2. These results will be called to the ordering clinician or  representative by the Radiologist Assistant, and communication documented in the PACS or [employer]. Electronically Signed   By: Signa Kell M.D.   On: 03/17/2020 18:46   CT ABDOMEN PELVIS W CONTRAST  Result Date: 03/16/2020 CLINICAL DATA:  Recent endoscopy with duodenal edema and narrowing, possible extrinsic compression EXAM: CT ABDOMEN AND PELVIS WITH CONTRAST TECHNIQUE: Multidetector CT imaging of the abdomen and pelvis was performed using the standard protocol following bolus  administration of intravenous contrast. CONTRAST:  OMNIPAQUE IOHEXOL 300 MG/ML  SOLN COMPARISON:  CT from 5 days previous FINDINGS: Lower chest: Small bilateral pleural effusions are noted. No focal infiltrate is seen. Tiny calcified granulomas are noted. Mild right basilar atelectasis is seen. Hepatobiliary: Fatty infiltration of the liver is noted. The gallbladder is well distended and demonstrates dependent density consistent with small gallstones as previously seen on CT and ultrasound examination. There is however significant new wall thickening of the gallbladder identified highly suspicious for acute cholecystitis. No biliary ductal dilatation is seen. The gallbladder thickening and enlargement causes impingement upon the distal stomach and duodenal bulb similar to that seen on recent endoscopy. Pancreas: Unremarkable. No pancreatic ductal dilatation or surrounding inflammatory changes. Spleen: Normal in size without focal abnormality. Adrenals/Urinary Tract: Adrenal glands again demonstrate a small right adrenal nodule stable in appearance from the prior exam. The kidneys demonstrate a normal enhancement pattern with multiple cysts identified. No renal calculi or obstructive changes are seen. The bladder is partially distended. Stomach/Bowel: Scattered diverticular change of the colon is noted without evidence of diverticulitis. No obstructive or inflammatory changes are seen. The appendix is within normal limits. Small bowel is within normal limits with the exception of the extrinsic compression upon the duodenal bulb similar to that seen on recent endoscopy. Vascular/Lymphatic: Aortic atherosclerosis. No enlarged abdominal or pelvic lymph nodes. Reproductive: Prostate is unremarkable. Other: Fat containing inguinal hernias left larger than right are again noted. No ascites is noted. Musculoskeletal: No acute or significant osseous findings. IMPRESSION: New changes consistent with gallbladder wall  thickening and pericholecystic inflammatory change with known cholelithiasis highly suggestive of acute cholecystitis. Repeat ultrasound or nuclear medicine exam may be helpful for further diagnosis. This causes changes of extrinsic compression upon the duodenal bulb similar to that noted on recent endoscopy. The remainder of the exam is stable from the prior study 5 days previous. Electronically Signed   By: Alcide Clever M.D.   On: 03/16/2020 19:31    Anti-infectives: Anti-infectives (From admission, onward)   Start     Dose/Rate Route Frequency Ordered Stop   03/18/20 0600  cefTRIAXone (ROCEPHIN) 2 g in sodium chloride 0.9 % 100 mL IVPB        2 g 200 mL/hr over 30 Minutes Intravenous On call to O.R. 03/17/20 1653 03/19/20 0559       Assessment/Plan HTN AKI on CKD - Cr 1.1 today  Chronic normocytic anemia - hgb 10.6 this AM Gout  Esophagitis/gastritis - s/p EGD 12/1 with duodenal edema with luminal narrowing, although no obvious ulceration/mass lesion/or active obstruction, unclear if this finding is caused or related to his present symptoms and chronicity of this, possibly could have prior scarring of history of duodenal ulcer versus component of extrinsic compression - per GI  RUQ pain, cholelithiasis Chronic cholecystitis  - RUQ [place] and CT A/P 11/26 equivocal for cholecystitis  - WBC was 15 on admit, normalized now - LFTs normal, Tbili mildly elevated at 1.4 - HIDA showed chronic cholecystitis, patient wishes to proceed to  OR today - risks and benefits of surgery discussed with patient who verbalized understanding  FEN: NPO, IVF VTE: SCDs ID: [email or web address]  LOS: 2 days    Juliet Rude , [facility] Surgery 03/18/2020, 8:49 AM Please see Amion for pager number during day hours 7:00am-4:30pm

## 2020-03-18 NOTE — Interval H&P Note (Signed)
History and Physical Interval Note:  03/18/2020 1:47 PM  Allen Torres  has presented today for surgery, with the diagnosis of CHOLELITHIASIS CHOLECYSTITIS.  The various methods of treatment have been discussed with the patient and family. After consideration of risks, benefits and other options for treatment, the patient has consented to  Procedure(s): LAPAROSCOPIC CHOLECYSTECTOMY WITH POSSIBLE INTRAOPERATIVE CHOLANGIOGRAM (N/A) as a surgical intervention.  The patient's history has been reviewed, patient examined, no change in status, stable for surgery.  I have reviewed the patient's chart and labs.  Questions were answered to the patient's satisfaction.     [name]

## 2020-03-18 NOTE — Progress Notes (Addendum)
PROGRESS NOTE    Allen Torres  KWI:[id number] DOB: 09/20/1940 DOA: 03/14/2020 PCP: Patient, No Pcp Per   Chief Complaint  Patient presents with   Abdominal Pain  Brief Narrative: 79 year old male with history of CKD unknown stage, hypertension, anemia morbid obesity with BMI 32 cm in the ED with recurrent right-sided abdominal pain.Right-sided abdominal pain for 4 days prior to admission initially seen in the ED 3 days previously with leukocytosis CT abdomen pelvis #2 distal esophageal wall thickening and mild thickening of the stomach and first portion of duodenum, was discharged from ED but came back with ongoing abdominal pain.  Patient is admitted, GI consulted.  Underwent EGD that showed gastritis/esophagitis duodenal edema with luminal narrowing, underwent further CT evaluation HIDA scan that showed chronic cholecystitis and given cholelithiasis and ongoing right upper quadrant abdominal pain seen by general surgery for cholecystectomy.  Subjective: No acute events overnight. Patient c/o mild right upper quadrant tenderness on palpation. Waiting for surgery today.  Assessment & Plan:   Esophagitis/gastritis s/p EGD 12/1 showed duodenal edema with luminal narrowing.  As per GI endoscopic finding are likely result of extrinsic compression of the D2 lesion by the gallbladder.  We will continue with PPI.  Chronic cholecystitis W/ cholelithiasis: a/w right-sided abdominal pain status post EGD that showed above, CT abdomen pelvis questionable acute cholecystitis, HIDA scan done shows chronic cholecystitis.  On first ED visit he had leukocytosis and also reported fever at home. Given patient's symptoms seen by surgery and patient wants to proceed with the OR today.  On ceftriaxone.  Essential hypertension blood pressure borderline controlled on atenolol and doxazosin.  Resume home lisinopril and HCTZ. Continue as needed medication  MEQ:[id number] Chronic microcytic anemia monitor  hemoglobin. Hyponatremia/hypovolemia resolved Morbid obesity with BMI more than 30 will benefit with weight loss.  Nutrition: Diet Order            Diet NPO time specified Except for: Sips with Meds  Diet effective now                 Nutrition Problem: Inadequate oral intake Etiology: altered GI function Signs/Symptoms: meal completion < 25% Interventions: Boost Breeze, Prostat, MVI  Body mass index is 32.62 kg/m.  DVT prophylaxis: SCD chemical prophylaxis hold for surgery Code Status:   Code Status: Full Code  Family Communication: plan of care discussed with patient at bedside.  Status is: Inpatient  Remains inpatient appropriate because:Inpatient level of care appropriate due to severity of illness and For ongoing management of abdominal pain and cholecystitis chronic   Dispo: The patient is from: Home              Anticipated d/c is to: Home              Anticipated d/c date is: 1 day              Patient currently is not medically stable to d/c.  Consultants:see note  Procedures:see note  Culture/Microbiology    Component Value Date/Time   SDES  03/14/2020 2011    BLOOD RIGHT ANTECUBITAL Performed at [facility], [address], [place], [place] [address]    SPECREQUEST  03/14/2020 2011    BOTTLES DRAWN AEROBIC AND ANAEROBIC Blood Culture adequate volume Performed at [facility], [address] [place], [place] [address]    CULT  03/14/2020 2011    NO GROWTH 4 DAYS Performed at [facility], [address] [place], [place]  [address]    REPTSTATUS PENDING 03/14/2020 2011    Other culture-see note  Medications: Scheduled Meds:  (feeding supplement) PROSource Plus  30 mL Oral BID WC   allopurinol  200 mg Oral Daily   atenolol  100 mg Oral Daily   atenolol  50 mg Oral QHS   doxazosin  2 mg Oral QPM   feeding supplement  1 Container Oral TID BM   multivitamin with minerals  1 tablet Oral Daily    Continuous Infusions:  sodium chloride 75 mL/hr at 03/18/20 0044   cefTRIAXone (ROCEPHIN)  IV      Antimicrobials: Anti-infectives (From admission, onward)   Start     Dose/Rate Route Frequency Ordered Stop   03/18/20 0600  cefTRIAXone (ROCEPHIN) 2 g in sodium chloride 0.9 % 100 mL IVPB        2 g 200 mL/hr over 30 Minutes Intravenous On call to O.R. 03/17/20 1653 03/19/20 0559     Objective: Vitals: Today's Vitals   03/18/20 0641 03/18/20 0851 03/18/20 0852 03/18/20 0926  BP: (!) 150/58  (!) 157/65 (!) 161/59  Pulse: 73  67 71  Resp: Temp: 99 F (37.2 C)  98.7 F (37.1 C)   TempSrc: Oral  Oral   SpO2: 95%  93%   Weight:      Height:      PainSc:  3       Intake/Output Summary (Last 24 hours) at 03/18/2020 1035 Last data filed at 03/18/2020 0851 Gross per 24 hour  Intake 1229.75 ml  Output --  Net 1229.75 ml   Filed Weights   03/14/20 1115 03/16/20 1351  Weight: 100.2 kg 100.2 kg   Weight change:   Intake/Output from previous day: 12/02 0701 - 12/03 0700 In: 1229.8 [P.O.:360; I.V.:869.8] Out: -  Intake/Output this shift: No intake/output data recorded.  Examination: General exam: AAOx3 ,NAD, weak appearing. HEENT:Oral mucosa moist, Ear/Nose WNL grossly,dentition normal. Respiratory system: bilaterally clear,no wheezing or crackles,no use of accessory muscle, non tender. Cardiovascular system: S1 & S2 +, regular, No JVD. Gastrointestinal system: Abdomen soft, slightly tender right upper quadrant,ND, BS+. Nervous System:Alert, awake, moving extremities and grossly nonfocal Extremities: No edema, distal peripheral pulses palpable.  Skin: No rashes,no icterus. MSK: Normal muscle bulk,tone, power  Data Reviewed: I have personally reviewed following labs and imaging studies CBC: Recent Labs  Lab 03/14/20 1156 03/15/20 0603 03/16/20 0528 03/17/20 0940 03/18/20 0520  WBC 10.0 8.3 9.0 10.3 9.6  HGB 11.0* 9.7* 10.5* 10.9* 10.6*  HCT 31.3*  27.7* 30.8* 31.0* 30.7*  MCV 95.7 95.5 97.2 95.1 95.9  PLT 199 180 195 223 232   Basic Metabolic Panel: Recent Labs  Lab 03/14/20 1156 03/15/20 0603 03/16/20 0528 03/17/20 0940 03/18/20 0520  NA 127* 132* 134* 131* 134*  K 3.6 3.8 3.9 3.7 3.9  CL 92* 101 101 97* 100  CO2 23 22 20* 18* 19*  GLUCOSE 178* 76 73 80 65*  BUN 35* 24* CREATININE 1.74* 1.40* 1.23 1.20 1.10  CALCIUM 8.4* 8.1* 8.5* 8.5* 8.5*   GFR: Estimated Creatinine Clearance: 64.6 mL/min (by C-G formula based on SCr of 1.1 mg/dL). Liver Function Tests: Recent Labs  Lab 03/14/20 1156 03/17/20 0940 03/18/20 0520  AST 27 28 33  ALT ALKPHOS 68 75 74  BILITOT 1.2 1.4* 1.4*  PROT 6.5 6.0* 5.7*  ALBUMIN 3.8 3.3* 3.2*   Recent Labs  Lab 03/14/20 1156  LIPASE 27   No results for input(s): AMMONIA in the last 168 hours. Coagulation Profile: No results for input(s): INR, PROTIME in the last 168 hours. Cardiac Enzymes: No results for input(s): CKTOTAL, CKMB, CKMBINDEX, TROPONINI in the last 168 hours. BNP (last 3 results) No results for input(s): PROBNP in the last 8760 hours. HbA1C: No results for input(s): HGBA1C in the last 72 hours. CBG: No results for input(s): GLUCAP in the last 168 hours. Lipid Profile: No results for input(s): CHOL, HDL, LDLCALC, TRIG, CHOLHDL, LDLDIRECT in the last 72 hours. Thyroid Function Tests: No results for input(s): TSH, T4TOTAL, FREET4, T3FREE, THYROIDAB in the last 72 hours. Anemia Panel: No results for input(s): VITAMINB12, FOLATE, FERRITIN, TIBC, IRON, RETICCTPCT in the last 72 hours. Sepsis Labs: No results for input(s): PROCALCITON, LATICACIDVEN in the last 168 hours.  Recent Results (from the past 240 hour(s))  Resp Panel by RT-PCR (Flu A&B, Covid) Nasopharyngeal Swab     Status: None   Collection Time: 03/14/20  2:34 PM   Specimen: Nasopharyngeal Swab; Nasopharyngeal(NP) swabs in vial transport medium  Result Value Ref Range Status   SARS  Coronavirus 2 by RT PCR NEGATIVE NEGATIVE Final    Comment: (NOTE) SARS-CoV-2 target nucleic acids are NOT DETECTED.  The SARS-CoV-2 RNA is generally detectable in upper respiratory specimens during the acute phase of infection. The lowest concentration of SARS-CoV-2 viral copies this assay can detect is 138 copies/mL. A negative result does not preclude SARS-Cov-2 infection and should not be used as the sole basis for treatment or other patient management decisions. A negative result may occur with  improper specimen collection/handling, submission of specimen other than nasopharyngeal swab, presence of viral mutation(s) within the areas targeted by this assay, and inadequate number of viral copies(<138 copies/mL). A negative result must be combined with clinical observations, patient history, and epidemiological information. The expected result is Negative.  Fact Sheet for Patients:  BloggerCourse.comhttps://www.fda.gov/media/152166/download  Fact Sheet for Healthcare Providers:  SeriousBroker.ithttps://www.fda.gov/media/152162/download  This test is no t yet approved or cleared by the [place] FDA and  has been authorized for detection and/or diagnosis of SARS-CoV-2 by FDA under an Emergency Use Authorization (EUA). This EUA will remain  in effect (meaning this test can be used) for the duration of the COVID-19 declaration under Section 564(b)(1) of the Act, 21 U.S.C.section 360bbb-3(b)(1), unless the authorization is terminated  or revoked sooner.       Influenza A by PCR NEGATIVE NEGATIVE Final   Influenza B by PCR NEGATIVE NEGATIVE Final    Comment: (NOTE) The Xpert Xpress SARS-CoV-2/FLU/RSV plus assay is intended as an aid in the diagnosis of influenza from Nasopharyngeal swab specimens and should not be used as a sole basis for treatment. Nasal washings and aspirates are unacceptable for Xpert Xpress SARS-CoV-2/FLU/RSV testing.  Fact Sheet for  Patients: BloggerCourse.comhttps://www.fda.gov/media/152166/download  Fact Sheet for Healthcare Providers: SeriousBroker.ithttps://www.fda.gov/media/152162/download  This test is not yet approved or cleared by the [place] FDA and has been authorized for detection and/or diagnosis of SARS-CoV-2 by FDA under an Emergency Use Authorization (EUA). This EUA will remain in effect (meaning this test can be used) for the duration of the COVID-19 declaration under Section 564(b)(1) of the Act, 21 U.S.C. section 360bbb-3(b)(1), unless the authorization is terminated or revoked.  Performed at [facility], [address] [place], [place] [id number]   Culture, blood (routine x 2)     Status: None (Preliminary result)   Collection Time: 03/14/20  8:10 PM  Specimen: BLOOD  Result Value Ref Range Status   Specimen Description   Final    BLOOD LEFT ANTECUBITAL Performed at [facility], [address], [place], [place] [address]    Special Requests   Final    BOTTLES DRAWN AEROBIC AND ANAEROBIC Blood Culture adequate volume Performed at [facility], [address], [place], [place] [address]    Culture   Final    NO GROWTH 4 DAYS Performed at [facility], [address], [place], [place] [address]    Report Status PENDING  Incomplete  Culture, blood (routine x 2)     Status: None (Preliminary result)   Collection Time: 03/14/20  8:11 PM   Specimen: BLOOD  Result Value Ref Range Status   Specimen Description   Final    BLOOD RIGHT ANTECUBITAL Performed at [facility], [address] [place], [place] [address]    Special Requests   Final    BOTTLES DRAWN AEROBIC AND ANAEROBIC Blood Culture adequate volume Performed at [facility], [address] [place], [place] [address]    Culture   Final    NO GROWTH 4 DAYS Performed at [facility], [address] [place], [place] [address]    Report Status  PENDING  Incomplete     Radiology Studies: NM Hepatobiliary Liver Func  Result Date: 03/17/2020 CLINICAL DATA:  Rule out cholecystitis. EXAM: NUCLEAR MEDICINE HEPATOBILIARY IMAGING TECHNIQUE: Sequential images of the abdomen were obtained out to 60 minutes following intravenous administration of radiopharmaceutical. RADIOPHARMACEUTICALS:  5.5 mCi Tc-15m  Choletec IV COMPARISON:  03/16/2020 FINDINGS: Prompt uptake and biliary excretion of activity by the liver is seen. Biliary activity passes into small bowel, consistent with patent common bile duct. After 1 hour no gallbladder activity was visualized. The patient subsequently received 3 mg of morphine and imaging was carried out for an additional 30 minutes with filling of the gallbladder. IMPRESSION: 1. Delayed filling of the gallbladder after 1 hour and after administration of morphine sulfate. 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 2. These results will be called to the ordering clinician or representative by the Radiologist Assistant, and communication documented in the PACS or [employer]. Electronically Signed   By: Signa Kell M.D.   On: 03/17/2020 18:46   CT ABDOMEN PELVIS W CONTRAST  Result Date: 03/16/2020 CLINICAL DATA:  Recent endoscopy with duodenal edema and narrowing, possible extrinsic compression EXAM: CT ABDOMEN AND PELVIS WITH CONTRAST TECHNIQUE: Multidetector CT imaging of the abdomen and pelvis was performed using the standard protocol following bolus administration of intravenous contrast. CONTRAST:  OMNIPAQUE IOHEXOL 300 MG/ML  SOLN COMPARISON:  CT from 5 days previous FINDINGS: Lower chest: Small bilateral pleural effusions are noted. No focal infiltrate is seen. Tiny calcified granulomas are noted. Mild right basilar atelectasis is seen. Hepatobiliary: Fatty infiltration of the liver is noted. The gallbladder is well distended and demonstrates dependent density consistent with small gallstones as  previously seen on CT and ultrasound examination. There is however significant new wall thickening of the gallbladder identified highly suspicious for acute cholecystitis. No biliary ductal dilatation is seen. The gallbladder thickening and enlargement causes impingement upon the distal stomach and duodenal bulb similar to that seen on recent endoscopy. Pancreas: Unremarkable. No pancreatic ductal dilatation or surrounding inflammatory changes. Spleen: Normal in size without focal abnormality. Adrenals/Urinary Tract: Adrenal glands again demonstrate a small right adrenal nodule stable in appearance from the prior exam.  The kidneys demonstrate a normal enhancement pattern with multiple cysts identified. No renal calculi or obstructive changes are seen. The bladder is partially distended. Stomach/Bowel: Scattered diverticular change of the colon is noted without evidence of diverticulitis. No obstructive or inflammatory changes are seen. The appendix is within normal limits. Small bowel is within normal limits with the exception of the extrinsic compression upon the duodenal bulb similar to that seen on recent endoscopy. Vascular/Lymphatic: Aortic atherosclerosis. No enlarged abdominal or pelvic lymph nodes. Reproductive: Prostate is unremarkable. Other: Fat containing inguinal hernias left larger than right are again noted. No ascites is noted. Musculoskeletal: No acute or significant osseous findings. IMPRESSION: New changes consistent with gallbladder wall thickening and pericholecystic inflammatory change with known cholelithiasis highly suggestive of acute cholecystitis. Repeat ultrasound or nuclear medicine exam may be helpful for further diagnosis. This causes changes of extrinsic compression upon the duodenal bulb similar to that noted on recent endoscopy. The remainder of the exam is stable from the prior study 5 days previous. Electronically Signed   By: Alcide Clever M.D.   On: 03/16/2020 19:31     LOS: 2  days   Lanae Boast, MD [facility]  03/18/2020, 10:35 AM

## 2020-03-18 NOTE — Progress Notes (Signed)
Spoke with 3 Public affairs consultant, report received for upcoming surgery. Will probably send for patient between 12-1, but that is an estimate.

## 2020-03-18 NOTE — Anesthesia Procedure Notes (Signed)
Procedure Name: Intubation Date/Time: 03/18/2020 2:22 PM Performed by: Victoriano Lain, CRNA Pre-anesthesia Checklist: Patient identified, Emergency Drugs available, Suction available, Patient being monitored and Timeout performed Patient Re-evaluated:Patient Re-evaluated prior to induction Oxygen Delivery Method: Circle system utilized Preoxygenation: Pre-oxygenation with 100% oxygen Induction Type: IV induction Ventilation: Mask ventilation without difficulty Laryngoscope Size: Mac and 4 Grade View: Grade I Tube type: Oral Tube size: 7.5 mm Number of attempts: 1 Placement Confirmation: ETT inserted through vocal cords under direct vision,  positive ETCO2 and breath sounds checked- equal and bilateral Secured at: 22 cm Tube secured with: Tape Dental Injury: Teeth and Oropharynx as per pre-operative assessment

## 2020-03-18 NOTE — H&P (View-Only) (Signed)
Progress Note  2 Days Post-Op  Subjective: Patient reports persistent mild RUQ pain. Denies nausea. Discussed results of HIDA and patient wishes to proceed with surgery. I spoke with his daughter on the phone as well.   Objective: Vital signs in last 24 hours: Temp:  [97.8 F (36.6 C)-99.1 F (37.3 C)] 99 F (37.2 C) (12/03 0641) Pulse Rate:  [64-75] 73 (12/03 0641) Resp:  [15-20] 18 (12/03 0641) BP: (150-190)/(54-84) 150/58 (12/03 0641) SpO2:  [92 %-96 %] 95 % (12/03 0641) Last BM Date: 03/17/20  Intake/Output from previous day: 12/02 0701 - 12/03 0700 In: 1229.8 [P.O.:360; I.V.:869.8] Out: -  Intake/Output this shift: No intake/output data recorded.  PE: General: pleasant, WD, obese male who is laying in bed in NAD HEENT: head is normocephalic, atraumatic.  Sclera are anicteric.  PERRL.  Ears and nose without any masses or lesions.  Mouth is pink and moist Heart: regular, rate, and rhythm.  Normal s1,s2. No obvious murmurs, gallops, or rubs noted.  Palpable radial and pedal pulses bilaterally Lungs: CTAB, no wheezes, rhonchi, or rales noted.  Respiratory effort nonlabored Abd: soft, very mildly ttp in RUQ but negative murphy sign, ND, +BS, no masses, hernias, or organomegaly MS: all 4 extremities are symmetrical with no cyanosis, clubbing, or edema. Skin: warm and dry with no masses, lesions, or rashes Neuro: Cranial nerves 2-12 grossly intact, sensation is normal throughout Psych: A&Ox3 with an appropriate affect.   Lab Results:  Recent Labs    03/17/20 0940 03/18/20 0520  WBC 10.3 9.6  HGB 10.9* 10.6*  HCT 31.0* 30.7*  PLT 223 232   BMET Recent Labs    03/17/20 0940 03/18/20 0520  NA 131* 134*  K 3.7 3.9  CL 97* 100  CO2 18* 19*  GLUCOSE 80 65*  BUN 16 15  CREATININE 1.20 1.10  CALCIUM 8.5* 8.5*   PT/INR No results for input(s): LABPROT, INR in the last 72 hours. CMP     Component Value Date/Time   NA 134 (L) 03/18/2020 0520   K 3.9  03/18/2020 0520   CL 100 03/18/2020 0520   CO2 19 (L) 03/18/2020 0520   GLUCOSE 65 (L) 03/18/2020 0520   BUN 15 03/18/2020 0520   CREATININE 1.10 03/18/2020 0520   CALCIUM 8.5 (L) 03/18/2020 0520   PROT 5.7 (L) 03/18/2020 0520   ALBUMIN 3.2 (L) 03/18/2020 0520   AST 33 03/18/2020 0520   ALT 24 03/18/2020 0520   ALKPHOS 74 03/18/2020 0520   BILITOT 1.4 (H) 03/18/2020 0520   GFRNONAA >60 03/18/2020 0520   Lipase     Component Value Date/Time   LIPASE 27 03/14/2020 1156       Studies/Results: NM Hepatobiliary Liver Func  Result Date: 03/17/2020 CLINICAL DATA:  Rule out cholecystitis. EXAM: NUCLEAR MEDICINE HEPATOBILIARY IMAGING TECHNIQUE: Sequential images of the abdomen were obtained out to 60 minutes following intravenous administration of radiopharmaceutical. RADIOPHARMACEUTICALS:  5.5 mCi Tc-63m  Choletec IV COMPARISON:  03/16/2020 FINDINGS: Prompt uptake and biliary excretion of activity by the liver is seen. Biliary activity passes into small bowel, consistent with patent common bile duct. After 1 hour no gallbladder activity was visualized. The patient subsequently received 3 mg of morphine and imaging was carried out for an additional 30 minutes with filling of the gallbladder. IMPRESSION: 1. Delayed filling of the gallbladder after 1 hour and after administration of morphine sulfate. 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 2. These results will be called to the ordering clinician or  representative by the Radiologist Assistant, and communication documented in the PACS or [employer]. Electronically Signed   By: Signa Kell M.D.   On: 03/17/2020 18:46   CT ABDOMEN PELVIS W CONTRAST  Result Date: 03/16/2020 CLINICAL DATA:  Recent endoscopy with duodenal edema and narrowing, possible extrinsic compression EXAM: CT ABDOMEN AND PELVIS WITH CONTRAST TECHNIQUE: Multidetector CT imaging of the abdomen and pelvis was performed using the standard protocol following bolus  administration of intravenous contrast. CONTRAST:  OMNIPAQUE IOHEXOL 300 MG/ML  SOLN COMPARISON:  CT from 5 days previous FINDINGS: Lower chest: Small bilateral pleural effusions are noted. No focal infiltrate is seen. Tiny calcified granulomas are noted. Mild right basilar atelectasis is seen. Hepatobiliary: Fatty infiltration of the liver is noted. The gallbladder is well distended and demonstrates dependent density consistent with small gallstones as previously seen on CT and ultrasound examination. There is however significant new wall thickening of the gallbladder identified highly suspicious for acute cholecystitis. No biliary ductal dilatation is seen. The gallbladder thickening and enlargement causes impingement upon the distal stomach and duodenal bulb similar to that seen on recent endoscopy. Pancreas: Unremarkable. No pancreatic ductal dilatation or surrounding inflammatory changes. Spleen: Normal in size without focal abnormality. Adrenals/Urinary Tract: Adrenal glands again demonstrate a small right adrenal nodule stable in appearance from the prior exam. The kidneys demonstrate a normal enhancement pattern with multiple cysts identified. No renal calculi or obstructive changes are seen. The bladder is partially distended. Stomach/Bowel: Scattered diverticular change of the colon is noted without evidence of diverticulitis. No obstructive or inflammatory changes are seen. The appendix is within normal limits. Small bowel is within normal limits with the exception of the extrinsic compression upon the duodenal bulb similar to that seen on recent endoscopy. Vascular/Lymphatic: Aortic atherosclerosis. No enlarged abdominal or pelvic lymph nodes. Reproductive: Prostate is unremarkable. Other: Fat containing inguinal hernias left larger than right are again noted. No ascites is noted. Musculoskeletal: No acute or significant osseous findings. IMPRESSION: New changes consistent with gallbladder wall  thickening and pericholecystic inflammatory change with known cholelithiasis highly suggestive of acute cholecystitis. Repeat ultrasound or nuclear medicine exam may be helpful for further diagnosis. This causes changes of extrinsic compression upon the duodenal bulb similar to that noted on recent endoscopy. The remainder of the exam is stable from the prior study 5 days previous. Electronically Signed   By: Alcide Clever M.D.   On: 03/16/2020 19:31    Anti-infectives: Anti-infectives (From admission, onward)   Start     Dose/Rate Route Frequency Ordered Stop   03/18/20 0600  cefTRIAXone (ROCEPHIN) 2 g in sodium chloride 0.9 % 100 mL IVPB        2 g 200 mL/hr over 30 Minutes Intravenous On call to O.R. 03/17/20 1653 03/19/20 0559       Assessment/Plan HTN AKI on CKD - Cr 1.1 today  Chronic normocytic anemia - hgb 10.6 this AM Gout  Esophagitis/gastritis - s/p EGD 12/1 with duodenal edema with luminal narrowing, although no obvious ulceration/mass lesion/or active obstruction, unclear if this finding is caused or related to his present symptoms and chronicity of this, possibly could have prior scarring of history of duodenal ulcer versus component of extrinsic compression - per GI  RUQ pain, cholelithiasis Chronic cholecystitis  - RUQ [place] and CT A/P 11/26 equivocal for cholecystitis  - WBC was 15 on admit, normalized now - LFTs normal, Tbili mildly elevated at 1.4 - HIDA showed chronic cholecystitis, patient wishes to proceed to  OR today - risks and benefits of surgery discussed with patient who verbalized understanding  FEN: NPO, IVF VTE: SCDs ID: [email or web address]  LOS: 2 days    Juliet Rude , [facility] 03/18/2020, 8:49 AM Please see Amion for pager number during day hours 7:00am-4:30pm

## 2020-03-18 NOTE — Transfer of Care (Signed)
Immediate Anesthesia Transfer of Care Note  Patient: Allen Torres  Procedure(s) Performed: LAPAROSCOPIC CHOLECYSTECTOMY (N/A Abdomen)  Patient Location: PACU  Anesthesia Type:General  Level of Consciousness: awake, alert  and patient cooperative  Airway & Oxygen Therapy: Patient Spontanous Breathing and Patient connected to face mask oxygen  Post-op Assessment: Report given to RN and Post -op Vital signs reviewed and stable  Post vital signs: Reviewed and stable  Last Vitals:  Vitals Value Taken Time  BP 160/73 03/18/20 1530  Temp    Pulse 64 03/18/20 1532  Resp 24 03/18/20 1532  SpO2 97 % 03/18/20 1532  Vitals shown include unvalidated device data.  Last Pain:  Vitals:   03/18/20 1403  TempSrc:   PainSc: Asleep      Patients Stated Pain Goal: 2 (03/16/20 1155)  Complications: No complications documented.

## 2020-03-18 NOTE — Op Note (Signed)
Allen Torres  Primary Care Physician:  Patient, No Pcp Per    03/18/2020  3:15 PM  Procedure: Laparoscopic Cholecystectomy   Surgeon: Susy Frizzle B. Daphine Deutscher, MD, FACS Asst:  Lannette Donath, MD  Anes:  General  Drains:  None  Findings: Green/dead walled off cholecystitis with hundreds of small caviar like anthracotic pigment stones    Description of Procedure: The patient was taken to OR 1 and given general anesthesia.  The patient was prepped with chlorohexidine prep and draped sterilely. A time out was performed including identifying the patient and discussing their procedure.  Access to the abdomen was achieved with a 5 mm Optiview through the right upper quadrant.  Port placement included three 5 mm and one 11 mm.  .    The gallbladder was visualized and appeared to be walled off and was in fact perforated.  The omental pack was peeled off revealing a bright green gallbladder fundus.   The fundus of the gallbaldder was grasped and the gallbladder was elevated. Traction on the infundibulum allowed for successful demonstration of the critical view. Inflammatory changes were subacute.  The cystic duct was identified and clipped up on the gallbladder and an incision was made in the cystic duct and the Reddick catheter was inserted after milking the cystic duct of any debris.  The stones were small and the anatomy was clear.  The cystic duct was then triple clipped and divided, the cystic artery was double clipped and divided and then the gallbladder was removed from the gallbladder bed. Removal of the gallbladder from the gallbladder bed was performed leaving the backwall which was cauterized.  The gallbladder was then placed in a bag and brought out through one of the trocar sites. The gallbladder bed was inspected and no bleeding or bile leaks were seen.   Laparoscopic visualization was used when closing fascial defects for trocar sites.   Incisions were injected with Exparel and closed with 4-0  Monocryl and Dermabond on the skin.  Sponge and needle count were correct.    The patient was taken to the recovery room in satisfactory condition.  Allen B. Daphine Deutscher, MD

## 2020-03-18 NOTE — Anesthesia Preprocedure Evaluation (Addendum)
Anesthesia Evaluation  Patient identified by MRN, date of birth, ID band Patient awake    Reviewed: Allergy & Precautions, NPO status , Patient's Chart, lab work & pertinent test results  Airway Mallampati: I  TM Distance: >3 FB Neck ROM: Full    Dental   Pulmonary former smoker,    Pulmonary exam normal        Cardiovascular hypertension, Pt. on medications Normal cardiovascular exam     Neuro/Psych    GI/Hepatic   Endo/Other    Renal/GU      Musculoskeletal   Abdominal   Peds  Hematology   Anesthesia Other Findings   Reproductive/Obstetrics                            Anesthesia Physical Anesthesia Plan  ASA: II  Anesthesia Plan: General   Post-op Pain Management:    Induction:   PONV Risk Score and Plan: 2  Airway Management Planned: Oral ETT  Additional Equipment:   Intra-op Plan:   Post-operative Plan: Extubation in OR  Informed Consent: I have reviewed the patients History and Physical, chart, labs and discussed the procedure including the risks, benefits and alternatives for the proposed anesthesia with the patient or authorized representative who has indicated his/her understanding and acceptance.       Plan Discussed with: CRNA and Surgeon  Anesthesia Plan Comments:         Anesthesia Quick Evaluation

## 2020-03-19 DIAGNOSIS — K811 Chronic cholecystitis: Secondary | ICD-10-CM | POA: Diagnosis not present

## 2020-03-19 LAB — CBC
HCT: 31.4 % — ABNORMAL LOW (ref 39.0–52.0)
Hemoglobin: 10.8 g/dL — ABNORMAL LOW (ref 13.0–17.0)
MCH: 33 pg (ref 26.0–34.0)
MCHC: 34.4 g/dL (ref 30.0–36.0)
MCV: 96 fL (ref 80.0–100.0)
Platelets: 290 10*3/uL (ref 150–400)
RBC: 3.27 MIL/uL — ABNORMAL LOW (ref 4.22–5.81)
RDW: 13.4 % (ref 11.5–15.5)
WBC: 9.8 10*3/uL (ref 4.0–10.5)
nRBC: 0 % (ref 0.0–0.2)

## 2020-03-19 LAB — COMPREHENSIVE METABOLIC PANEL
ALT: 30 U/L (ref 0–44)
AST: 37 U/L (ref 15–41)
Albumin: 3 g/dL — ABNORMAL LOW (ref 3.5–5.0)
Alkaline Phosphatase: 80 U/L (ref 38–126)
Anion gap: 14 (ref 5–15)
BUN: 21 mg/dL (ref 8–23)
CO2: 19 mmol/L — ABNORMAL LOW (ref 22–32)
Calcium: 8.1 mg/dL — ABNORMAL LOW (ref 8.9–10.3)
Chloride: 98 mmol/L (ref 98–111)
Creatinine, Ser: 1.17 mg/dL (ref 0.61–1.24)
GFR, Estimated: >60 mL/min (ref >60)
Glucose, Bld: 248 mg/dL — ABNORMAL HIGH (ref 70–99)
Potassium: 4.3 mmol/L (ref 3.5–5.1)
Sodium: 131 mmol/L — ABNORMAL LOW (ref 135–145)
Total Bilirubin: 0.8 mg/dL (ref 0.3–1.2)
Total Protein: 5.7 g/dL — ABNORMAL LOW (ref 6.5–8.1)

## 2020-03-19 LAB — CULTURE, BLOOD (ROUTINE X 2)
Culture: NO GROWTH
Culture: NO GROWTH
Special Requests: ADEQUATE
Special Requests: ADEQUATE

## 2020-03-19 MED ORDER — HYDROCHLOROTHIAZIDE 12.5 MG PO CAPS
12.5000 mg | ORAL_CAPSULE | Freq: Every day | ORAL | Status: DC
  Administered 2020-03-19: 12.5 mg via ORAL
  Filled 2020-03-19: qty 1

## 2020-03-19 MED ORDER — PANTOPRAZOLE SODIUM 40 MG PO TBEC: 40.0000 mg | DELAYED_RELEASE_TABLET | Freq: Every day | ORAL | 0 refills | Status: DC

## 2020-03-19 MED ORDER — LISINOPRIL 20 MG PO TABS
40.0000 mg | ORAL_TABLET | Freq: Every day | ORAL | Status: DC
  Administered 2020-03-19: 40 mg via ORAL
  Filled 2020-03-19: qty 2

## 2020-03-19 MED ORDER — TRAMADOL HCL 50 MG PO TABS: 50.0000 mg | ORAL_TABLET | Freq: Four times a day (QID) | ORAL | 0 refills | Status: DC | PRN

## 2020-03-19 MED ORDER — ATENOLOL 50 MG PO TABS
100.0000 mg | ORAL_TABLET | Freq: Every day | ORAL | Status: DC
  Administered 2020-03-19: 100 mg via ORAL
  Filled 2020-03-19: qty 2

## 2020-03-19 NOTE — Discharge Summary (Signed)
Physician Discharge Summary  Allen Torres PXT:[id number] DOB: 1940-12-20 DOA: 03/14/2020  PCP: Patient, No Pcp Per  Admit date: 03/14/2020 Discharge date: 03/19/2020  Admitted From: Home Disposition: Home  Recommendations for Outpatient Follow-up:  1. Follow up with PCP in 1-2 weeks 2. Please obtain BMP/CBC in one week 3. Follow-up with general surgery 4. Monitor blood pressure at home  Home Health: No Equipment/Devices: None  Discharge Condition: Stable Code Status:   Code Status: Full Code Diet recommendation:  Diet Order            Diet Heart Room service appropriate? Yes; Fluid consistency: Thin  Diet effective now           Diet - low sodium heart healthy                  Brief/Interim Summary: 79 year old male with history of CKD unknown stage, hypertension, anemia morbid obesity with BMI 32 cm in the ED with recurrent right-sided abdominal pain.Right-sided abdominal pain for 4 days prior to admission initially seen in the ED 3 days previously with leukocytosis CT abdomen pelvis #2 distal esophageal wall thickening and mild thickening of the stomach and first portion of duodenum, was discharged from ED but came back with ongoing abdominal pain. Patient is admitted, GI consulted.  Underwent EGD that showed gastritis/esophagitis duodenal edema with luminal narrowing, underwent further CT evaluation HIDA scan that showed chronic cholecystitis and given cholelithiasis and ongoing right upper quadrant abdominal pain seen by general surgery for cholecystectomy. Patient underwent laparoscopic cholecystectomy 12/3 by Dr. Daphine Deutscher.Doing well pain is controlled.  Diet advanced to regular diet and will go home today once he tolerates the stable urgery recommendation  Discharge Diagnoses:  Esophagitis/gastritis s/p EGD 12/1 showed duodenal edema with luminal narrowing.  As per GI endoscopic finding are likely result of extrinsic compression of the D2 lesion by the gallbladder.  We will  continue with PPI.  Chronic cholecystitis W/ cholelithiasis: Admitted w/ right-sided abdominal pain status post EGD that showed above, CT abdomen pelvis questionable acute cholecystitis, HIDA scan done shows chronic cholecystitis.  On first ED visit he had leukocytosis and also reported fever at home. Given patient's symptoms seen by surgery and underwent cholecystectomy.  Postop doing well, diet advanced tolerated.  He will go home and is cleared by surgery.   Essential hypertension blood borderline controlled resume home atenolol doxazosin lisinopril HCTZ.  Follow-up with PCP for ongoing blood pressure monitoring.  OEV:[id number] Chronic microcytic anemia monitor hemoglobin. Hyponatremia/hypovolemia resolved Morbid obesity with BMI more than 30 will benefit with weight loss.  Consults:  Surgery and gastroenterology  Subjective: Alert awake had a bowel movement passing flatus would like to go home today after eating meal  Discharge Exam: Vitals:   03/19/20 0848 03/19/20 1031  BP: (!) 178/92 (!) 171/70  Pulse: 69 62  Resp: 16   Temp:    SpO2: 95%    General: Pt is alert, awake, not in acute distress Cardiovascular: RRR, S1/S2 +, no rubs, no gallops Respiratory: CTA bilaterally, no wheezing, no rhonchi Abdominal: Soft, NT, ND, bowel sounds + Extremities: no edema, no cyanosis  Discharge Instructions  Discharge Instructions    Diet - low sodium heart healthy   Complete by: As directed    Discharge instructions   Complete by: As directed    Please follow-up with your family doctor regarding your blood pressure.  Check blood pressure at home daily.  Follow-up with general surgery for postop follow-up care  Please call call  MD or return to ER for similar or worsening recurring problem that brought you to hospital or if any fever,nausea/vomiting,abdominal pain, uncontrolled pain, chest pain,  shortness of breath or any other alarming symptoms.  Please follow-up your doctor as  instructed in a week time and call the office for appointment.  Please avoid alcohol, smoking, or any other illicit substance and maintain healthy habits including taking your regular medications as prescribed.  You were cared for by a hospitalist during your hospital stay. If you have any questions about your discharge medications or the care you received while you were in the hospital after you are discharged, you can call the unit and ask to speak with the hospitalist on call if the hospitalist that took care of you is not available.  Once you are discharged, your primary care physician will handle any further medical issues. Please note that NO REFILLS for any discharge medications will be authorized once you are discharged, as it is imperative that you return to your primary care physician (or establish a relationship with a primary care physician if you do not have one) for your aftercare needs so that they can reassess your need for medications and monitor your lab values   Increase activity slowly   Complete by: As directed      Allergies as of 03/19/2020      Reactions   Colchicine Diarrhea   Indocin [indomethacin]    "messed up my liver"   Statins Other (See Comments)   Patient states that he hurt so bad, hard to get up in the morning.  Felt like his system was shut down and under able to have a bowel movement      Medication List    TAKE these medications   acetaminophen 500 MG tablet Commonly known as: TYLENOL Take 1,000 mg by mouth every 6 (six) hours as needed for moderate pain.   allopurinol 100 MG tablet Commonly known as: ZYLOPRIM Take 200 mg by mouth daily.   atenolol 100 MG tablet Commonly known as: TENORMIN Take 50-100 mg by mouth 2 (two) times daily. Take 1 tablet (100 mg) in the morning and 1/2 tablet (50 mg) at bedtime   cholecalciferol 25 MCG (1000 UNIT) tablet Commonly known as: VITAMIN D3 Take 1,000 Units by mouth daily.   doxazosin 2 MG  tablet Commonly known as: CARDURA Take 2 mg by mouth every evening.   fluticasone 50 MCG/ACT nasal spray Commonly known as: FLONASE Place 1 spray into both nostrils daily as needed for allergies.   hydrochlorothiazide 12.5 MG capsule Commonly known as: MICROZIDE Take 12.5 mg by mouth daily.   HYDROcodone-acetaminophen 5-325 MG tablet Commonly known as: NORCO/VICODIN Take 1-2 tablets by mouth every 6 (six) hours as needed. What changed: reasons to take this   lisinopril 40 MG tablet Commonly known as: ZESTRIL Take 40 mg by mouth daily.   magnesium 30 MG tablet Take 30 mg by mouth daily.   ondansetron 4 MG disintegrating tablet Commonly known as: Zofran ODT Take 1 tablet (4 mg total) by mouth every 8 (eight) hours as needed for nausea or vomiting.   pantoprazole 40 MG tablet Commonly known as: PROTONIX Take 1 tablet (40 mg total) by mouth daily. Start taking on: March 20, 2020   traMADol 50 MG tablet Commonly known as: ULTRAM Take 1-2 tablets (50-100 mg total) by mouth every 6 (six) hours as needed for moderate pain.   vitamin B-12 100 MCG tablet Commonly known as: CYANOCOBALAMIN Take  100 mcg by mouth daily.   vitamin C 1000 MG tablet Take 1,000 mg by mouth daily.   zinc gluconate 50 MG tablet Take 50 mg by mouth daily.       Follow-up Information    [facility], [facility]. Go on 04/05/2020.   Specialty: General Surgery Why: Follow up appointment scheduled for 8:30 AM. Please arrive 30 min prior to appointment time and bring photo ID and insurance information.   Contact information: [address] [place] [id number] [phone]        Primary care physician Follow up in 1 week(s).   Why: For ongoing blood pressure management and monitoring             Allergies  Allergen Reactions  . Colchicine Diarrhea  . Indocin [Indomethacin]     "messed up my liver"  . Statins Other (See Comments)    Patient states that he hurt so bad,  hard to get up in the morning.  Felt like his system was shut down and under able to have a bowel movement    The results of significant diagnostics from this hospitalization (including imaging, microbiology, ancillary and laboratory) are listed below for reference.    Microbiology: Recent Results (from the past 240 hour(s))  Resp Panel by RT-PCR (Flu A&B, Covid) Nasopharyngeal Swab     Status: None   Collection Time: 03/14/20  2:34 PM   Specimen: Nasopharyngeal Swab; Nasopharyngeal(NP) swabs in vial transport medium  Result Value Ref Range Status   SARS Coronavirus 2 by RT PCR NEGATIVE NEGATIVE Final    Comment: (NOTE) SARS-CoV-2 target nucleic acids are NOT DETECTED.  The SARS-CoV-2 RNA is generally detectable in upper respiratory specimens during the acute phase of infection. The lowest concentration of SARS-CoV-2 viral copies this assay can detect is 138 copies/mL. A negative result does not preclude SARS-Cov-2 infection and should not be used as the sole basis for treatment or other patient management decisions. A negative result may occur with  improper specimen collection/handling, submission of specimen other than nasopharyngeal swab, presence of viral mutation(s) within the areas targeted by this assay, and inadequate number of viral copies(<138 copies/mL). A negative result must be combined with clinical observations, patient history, and epidemiological information. The expected result is Negative.  Fact Sheet for Patients:  BloggerCourse.comhttps://www.fda.gov/media/152166/download  Fact Sheet for Healthcare Providers:  SeriousBroker.ithttps://www.fda.gov/media/152162/download  This test is no t yet approved or cleared by the [place] FDA and  has been authorized for detection and/or diagnosis of SARS-CoV-2 by FDA under an Emergency Use Authorization (EUA). This EUA will remain  in effect (meaning this test can be used) for the duration of the COVID-19 declaration under Section 564(b)(1) of the  Act, 21 U.S.C.section 360bbb-3(b)(1), unless the authorization is terminated  or revoked sooner.       Influenza A by PCR NEGATIVE NEGATIVE Final   Influenza B by PCR NEGATIVE NEGATIVE Final    Comment: (NOTE) The Xpert Xpress SARS-CoV-2/FLU/RSV plus assay is intended as an aid in the diagnosis of influenza from Nasopharyngeal swab specimens and should not be used as a sole basis for treatment. Nasal washings and aspirates are unacceptable for Xpert Xpress SARS-CoV-2/FLU/RSV testing.  Fact Sheet for Patients: BloggerCourse.comhttps://www.fda.gov/media/152166/download  Fact Sheet for Healthcare Providers: SeriousBroker.ithttps://www.fda.gov/media/152162/download  This test is not yet approved or cleared by the [place] FDA and has been authorized for detection and/or diagnosis of SARS-CoV-2 by FDA under an Emergency Use Authorization (EUA). This EUA will remain in  effect (meaning this test can be used) for the duration of the COVID-19 declaration under Section 564(b)(1) of the Act, 21 U.S.C. section 360bbb-3(b)(1), unless the authorization is terminated or revoked.  Performed at [facility], [address], [place], [place] [address]   Culture, blood (routine x 2)     Status: None   Collection Time: 03/14/20  8:10 PM   Specimen: BLOOD  Result Value Ref Range Status   Specimen Description   Final    BLOOD LEFT ANTECUBITAL Performed at [facility], [address] [place], [place] [address]    Special Requests   Final    BOTTLES DRAWN AEROBIC AND ANAEROBIC Blood Culture adequate volume Performed at [facility], [address], [place], [place] [address]    Culture   Final    NO GROWTH 5 DAYS Performed at [facility], [address], [place], [place] [address]    Report Status 03/19/2020 FINAL  Final  Culture, blood (routine x 2)     Status: None   Collection Time: 03/14/20  8:11 PM   Specimen: BLOOD  Result Value Ref Range Status    Specimen Description   Final    BLOOD RIGHT ANTECUBITAL Performed at [facility], [address], [place], [place] [address]    Special Requests   Final    BOTTLES DRAWN AEROBIC AND ANAEROBIC Blood Culture adequate volume Performed at [facility], [address] [place], [place] [address]    Culture   Final    NO GROWTH 5 DAYS Performed at [facility], [address], [place], [place] [address]    Report Status 03/19/2020 FINAL  Final    Procedures/Studies: NM Hepatobiliary Liver Func  Result Date: 03/17/2020 CLINICAL DATA:  Rule out cholecystitis. EXAM: NUCLEAR MEDICINE HEPATOBILIARY IMAGING TECHNIQUE: Sequential images of the abdomen were obtained out to 60 minutes following intravenous administration of radiopharmaceutical. RADIOPHARMACEUTICALS:  5.5 mCi Tc-42m  Choletec IV COMPARISON:  03/16/2020 FINDINGS: Prompt uptake and biliary excretion of activity by the liver is seen. Biliary activity passes into small bowel, consistent with patent common bile duct. After 1 hour no gallbladder activity was visualized. The patient subsequently received 3 mg of morphine and imaging was carried out for an additional 30 minutes with filling of the gallbladder. IMPRESSION: 1. Delayed filling of the gallbladder after 1 hour and after administration of morphine sulfate. 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 2. These results will be called to the ordering clinician or representative by the Radiologist Assistant, and communication documented in the PACS or [employer]. Electronically Signed   By: Signa Kell M.D.   On: 03/17/2020 18:46   CT ABDOMEN PELVIS W CONTRAST  Result Date: 03/16/2020 CLINICAL DATA:  Recent endoscopy with duodenal edema and narrowing, possible extrinsic compression EXAM: CT ABDOMEN AND PELVIS WITH CONTRAST TECHNIQUE: Multidetector CT imaging of the abdomen and pelvis was performed using the standard protocol  following bolus administration of intravenous contrast. CONTRAST:  OMNIPAQUE IOHEXOL 300 MG/ML  SOLN COMPARISON:  CT from 5 days previous FINDINGS: Lower chest: Small bilateral pleural effusions are noted. No focal infiltrate is seen. Tiny calcified granulomas are noted. Mild right basilar atelectasis is seen. Hepatobiliary: Fatty infiltration of the liver is noted. The gallbladder is well distended and demonstrates dependent density consistent with small gallstones as previously seen on CT and ultrasound examination. There is however significant new wall thickening of the gallbladder identified highly  suspicious for acute cholecystitis. No biliary ductal dilatation is seen. The gallbladder thickening and enlargement causes impingement upon the distal stomach and duodenal bulb similar to that seen on recent endoscopy. Pancreas: Unremarkable. No pancreatic ductal dilatation or surrounding inflammatory changes. Spleen: Normal in size without focal abnormality. Adrenals/Urinary Tract: Adrenal glands again demonstrate a small right adrenal nodule stable in appearance from the prior exam. The kidneys demonstrate a normal enhancement pattern with multiple cysts identified. No renal calculi or obstructive changes are seen. The bladder is partially distended. Stomach/Bowel: Scattered diverticular change of the colon is noted without evidence of diverticulitis. No obstructive or inflammatory changes are seen. The appendix is within normal limits. Small bowel is within normal limits with the exception of the extrinsic compression upon the duodenal bulb similar to that seen on recent endoscopy. Vascular/Lymphatic: Aortic atherosclerosis. No enlarged abdominal or pelvic lymph nodes. Reproductive: Prostate is unremarkable. Other: Fat containing inguinal hernias left larger than right are again noted. No ascites is noted. Musculoskeletal: No acute or significant osseous findings. IMPRESSION: New changes consistent with  gallbladder wall thickening and pericholecystic inflammatory change with known cholelithiasis highly suggestive of acute cholecystitis. Repeat ultrasound or nuclear medicine exam may be helpful for further diagnosis. This causes changes of extrinsic compression upon the duodenal bulb similar to that noted on recent endoscopy. The remainder of the exam is stable from the prior study 5 days previous. Electronically Signed   By: Alcide Clever M.D.   On: 03/16/2020 19:31   CT ABDOMEN PELVIS W CONTRAST  Result Date: 03/11/2020 CLINICAL DATA:  Right-sided mid abdominal pain with emesis and bloating since yesterday. EXAM: CT ABDOMEN AND PELVIS WITH CONTRAST TECHNIQUE: Multidetector CT imaging of the abdomen and pelvis was performed using the standard protocol following bolus administration of intravenous contrast. CONTRAST:  OMNIPAQUE IOHEXOL 300 MG/ML  SOLN COMPARISON:  Right upper quadrant ultrasound from same day. FINDINGS: Lower chest: Circumferential wall thickening of the distal esophagus. Subsegmental atelectasis at the lung bases. Hepatobiliary: No focal liver abnormality. Tiny layering stones and/or sludge in the gallbladder. No gallbladder wall thickening or biliary dilatation. Pancreas: Unremarkable. No pancreatic ductal dilatation or surrounding inflammatory changes. Spleen: Normal in size without focal abnormality. Adrenals/Urinary Tract: Indeterminate 1.1 cm right adrenal nodule. The left adrenal gland is unremarkable. Bilateral renal simple cysts. No renal calculi or hydronephrosis. The bladder is unremarkable. Stomach/Bowel: Small hiatal hernia. Mild wall thickening of the gastric antrum and first portion of the duodenum (series 2, image 27). No obstruction. Sigmoid colonic diverticulosis. Normal appendix. Vascular/Lymphatic: Aortic atherosclerosis. No enlarged abdominal or pelvic lymph nodes. Reproductive: Prostate is normal in size with central calcifications. Other: No free fluid or  pneumoperitoneum. Moderate left and small right fat containing inguinal hernias. Small fat containing umbilical hernia. Musculoskeletal: No acute or significant osseous findings. IMPRESSION: 1. Mild wall thickening of the gastric antrum and first portion of the duodenum may reflect gastroduodenitis. 2. Circumferential wall thickening of the distal esophagus amy reflect esophagitis. 3. Cholelithiasis and/or sludge without evidence of acute cholecystitis. 4. Indeterminate 1.1 cm right adrenal nodule. 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 Consider 12 month follow-up adrenal protocol CT with and without contrast. This recommendation follows ACR consensus guidelines: Management of Incidental Adrenal Masses: A White Paper of the ACR Incidental Findings Committee. J Am Coll Radiol 2017;14:1038-1044. 5. Aortic Atherosclerosis (ICD10-I70.0). Electronically Signed   By: Obie Dredge M.D.   On: 03/11/2020 15:14   US Abdomen Limited RUQ (LIVER/GB)  Result  Date: 03/11/2020 CLINICAL DATA:  Abdominal pain EXAM: ULTRASOUND ABDOMEN LIMITED RIGHT UPPER QUADRANT COMPARISON:  None. FINDINGS: Gallbladder: Multiple mobile layering tiny stones or sludge within the gallbladder lumen. No pericholecystic edema or wall thickening visualized. No sonographic Murphy sign noted by sonographer. Common bile duct: Diameter: 4 mm. Liver: Limited evaluation as the left hepatic lobe was obscured by overlying bowel gas. Within this limitation, no focal lesion identified. Within normal limits in parenchymal echogenicity. Portal vein is patent on color Doppler imaging with normal direction of blood flow towards the liver. Other: None. IMPRESSION: 1. Cholelithiasis without sonographic evidence of acute cholecystitis. 2. Evaluation the liver is limited by overlying bowel gas. No definite sonographic abnormality. Electronically Signed   By: Duanne Guess D.O.   On: 03/11/2020 13:09    Labs: BNP (last 3  results) No results for input(s): BNP in the last 8760 hours. Basic Metabolic Panel: Recent Labs  Lab 03/15/20 0603 03/16/20 0528 03/17/20 0940 03/18/20 0520 03/19/20 0620  NA 132* 134* 131* 134* 131*  K 3.8 3.9 3.7 3.9 4.3  CL 101 101 97* 100 98  CO2 22 20* 18* 19* 19*  GLUCOSE 76 73 80 65* 248*  BUN 24* CREATININE 1.40* 1.23 1.20 1.10 1.17  CALCIUM 8.1* 8.5* 8.5* 8.5* 8.1*   Liver Function Tests: Recent Labs  Lab 03/14/20 1156 03/17/20 0940 03/18/20 0520 03/19/20 0620  AST 27 28 33 37  ALT ALKPHOS 68 75 74 80  BILITOT 1.2 1.4* 1.4* 0.8  PROT 6.5 6.0* 5.7* 5.7*  ALBUMIN 3.8 3.3* 3.2* 3.0*   Recent Labs  Lab 03/14/20 1156  LIPASE 27   No results for input(s): AMMONIA in the last 168 hours. CBC: Recent Labs  Lab 03/15/20 0603 03/16/20 0528 03/17/20 0940 03/18/20 0520 03/19/20 0620  WBC 8.3 9.0 10.3 9.6 9.8  HGB 9.7* 10.5* 10.9* 10.6* 10.8*  HCT 27.7* 30.8* 31.0* 30.7* 31.4*  MCV 95.5 97.2 95.1 95.9 96.0  PLT 180 195 223 232 290   Cardiac Enzymes: No results for input(s): CKTOTAL, CKMB, CKMBINDEX, TROPONINI in the last 168 hours. BNP: Invalid input(s): POCBNP CBG: No results for input(s): GLUCAP in the last 168 hours. D-Dimer No results for input(s): DDIMER in the last 72 hours. Hgb A1c No results for input(s): HGBA1C in the last 72 hours. Lipid Profile No results for input(s): CHOL, HDL, LDLCALC, TRIG, CHOLHDL, LDLDIRECT in the last 72 hours. Thyroid function studies No results for input(s): TSH, T4TOTAL, T3FREE, THYROIDAB in the last 72 hours.  Invalid input(s): FREET3 Anemia work up No results for input(s): VITAMINB12, FOLATE, FERRITIN, TIBC, IRON, RETICCTPCT in the last 72 hours. Urinalysis No results found for: COLORURINE, APPEARANCEUR, LABSPEC, PHURINE, GLUCOSEU, HGBUR, BILIRUBINUR, KETONESUR, PROTEINUR, UROBILINOGEN, NITRITE, LEUKOCYTESUR Sepsis Labs Invalid input(s): PROCALCITONIN,  WBC,   LACTICIDVEN Microbiology Recent Results (from the past 240 hour(s))  Resp Panel by RT-PCR (Flu A&B, Covid) Nasopharyngeal Swab     Status: None   Collection Time: 03/14/20  2:34 PM   Specimen: Nasopharyngeal Swab; Nasopharyngeal(NP) swabs in vial transport medium  Result Value Ref Range Status   SARS Coronavirus 2 by RT PCR NEGATIVE NEGATIVE Final    Comment: (NOTE) SARS-CoV-2 target nucleic acids are NOT DETECTED.  The SARS-CoV-2 RNA is generally detectable in upper respiratory specimens during the acute phase of infection. The lowest concentration of SARS-CoV-2 viral copies this assay can detect is 138 copies/mL. A negative result does not preclude SARS-Cov-2 infection and  should not be used as the sole basis for treatment or other patient management decisions. A negative result may occur with  improper specimen collection/handling, submission of specimen other than nasopharyngeal swab, presence of viral mutation(s) within the areas targeted by this assay, and inadequate number of viral copies(<138 copies/mL). A negative result must be combined with clinical observations, patient history, and epidemiological information. The expected result is Negative.  Fact Sheet for Patients:  BloggerCourse.com  Fact Sheet for Healthcare Providers:  SeriousBroker.it  This test is no t yet approved or cleared by the [place] FDA and  has been authorized for detection and/or diagnosis of SARS-CoV-2 by FDA under an Emergency Use Authorization (EUA). This EUA will remain  in effect (meaning this test can be used) for the duration of the COVID-19 declaration under Section 564(b)(1) of the Act, 21 U.S.C.section 360bbb-3(b)(1), unless the authorization is terminated  or revoked sooner.       Influenza A by PCR NEGATIVE NEGATIVE Final   Influenza B by PCR NEGATIVE NEGATIVE Final    Comment: (NOTE) The Xpert Xpress SARS-CoV-2/FLU/RSV plus  assay is intended as an aid in the diagnosis of influenza from Nasopharyngeal swab specimens and should not be used as a sole basis for treatment. Nasal washings and aspirates are unacceptable for Xpert Xpress SARS-CoV-2/FLU/RSV testing.  Fact Sheet for Patients: BloggerCourse.com  Fact Sheet for Healthcare Providers: SeriousBroker.it  This test is not yet approved or cleared by the [place] FDA and has been authorized for detection and/or diagnosis of SARS-CoV-2 by FDA under an Emergency Use Authorization (EUA). This EUA will remain in effect (meaning this test can be used) for the duration of the COVID-19 declaration under Section 564(b)(1) of the Act, 21 U.S.C. section 360bbb-3(b)(1), unless the authorization is terminated or revoked.  Performed at [facility], [address], [place], [place] [address]   Culture, blood (routine x 2)     Status: None   Collection Time: 03/14/20  8:10 PM   Specimen: BLOOD  Result Value Ref Range Status   Specimen Description   Final    BLOOD LEFT ANTECUBITAL Performed at [facility], [address] [place], [place] [address]    Special Requests   Final    BOTTLES DRAWN AEROBIC AND ANAEROBIC Blood Culture adequate volume Performed at [facility], [address] [place], [place] [address]    Culture   Final    NO GROWTH 5 DAYS Performed at [facility], [address], [place], [place] [address]    Report Status 03/19/2020 FINAL  Final  Culture, blood (routine x 2)     Status: None   Collection Time: 03/14/20  8:11 PM   Specimen: BLOOD  Result Value Ref Range Status   Specimen Description   Final    BLOOD RIGHT ANTECUBITAL Performed at [facility], [address] [place], [place] [address]    Special Requests   Final    BOTTLES DRAWN AEROBIC AND ANAEROBIC Blood Culture adequate  volume Performed at [facility], [address], [place], [place] [address]    Culture   Final    NO GROWTH 5 DAYS Performed at [facility], [address] [place], [place] [address]    Report Status 03/19/2020 FINAL  Final     Time coordinating discharge: 35  minutes  SIGNED: Lanae Boast, MD  [facility] 03/19/2020, 10:52 AM  If 7PM-7AM, please contact night-coverage  [email or web address]

## 2020-03-19 NOTE — Discharge Instructions (Signed)
[facility] LAPAROSCOPIC SURGERY: POST OP INSTRUCTIONS Always review your discharge instruction sheet given to you by the facility where your surgery was performed. IF YOU HAVE DISABILITY OR FAMILY LEAVE FORMS, YOU MUST BRING THEM TO THE OFFICE FOR PROCESSING.   DO NOT GIVE THEM TO YOUR DOCTOR.  PAIN CONTROL  1. First take acetaminophen (Tylenol) AND/or ibuprofen (Advil) to control your pain after surgery.  Follow directions on package.  Taking acetaminophen (Tylenol) and/or ibuprofen (Advil) regularly after surgery will help to control your pain and lower the amount of prescription pain medication you may need.  You should not take more than 3,000 mg (3 grams) of acetaminophen (Tylenol) in 24 hours.  You should not take ibuprofen (Advil), aleve, motrin, naprosyn or other NSAIDS if you have a history of stomach ulcers or chronic kidney disease.  2. A prescription for pain medication may be given to you upon discharge.  Take your pain medication as prescribed, if you still have uncontrolled pain after taking acetaminophen (Tylenol) or ibuprofen (Advil). 3. Use ice packs to help control pain. 4. If you need a refill on your pain medication, please contact your pharmacy.  They will contact our office to request authorization. Prescriptions will not be filled after 5pm or on week-ends.  HOME MEDICATIONS 5. Take your usually prescribed medications unless otherwise directed.  DIET 6. You should follow a light diet the first few days after arrival home.  Be sure to include lots of fluids daily. Avoid fatty, fried foods.   CONSTIPATION 7. It is common to experience some constipation after surgery and if you are taking pain medication.  Increasing fluid intake and taking a stool softener (such as Colace) will usually help or prevent this problem from occurring.  A mild laxative (Milk of Magnesia or Miralax) should be taken according to package instructions if there are no bowel  movements after 48 hours.  WOUND/INCISION CARE 8. Most patients will experience some swelling and bruising in the area of the incisions.  Ice packs will help.  Swelling and bruising can take several days to resolve.  9. Unless discharge instructions indicate otherwise, follow guidelines below  a. STERI-STRIPS - you may remove your outer bandages 48 hours after surgery, and you may shower at that time.  You have steri-strips (small skin tapes) in place directly over the incision.  These strips should be left on the skin for 7-10 days.   b. DERMABOND/SKIN GLUE - you may shower in 24 hours.  The glue will flake off over the next 2-3 weeks. 10. Any sutures or staples will be removed at the office during your follow-up visit.  ACTIVITIES 11. You may resume regular (light) daily activities beginning the next day--such as daily self-care, walking, climbing stairs--gradually increasing activities as tolerated.  You may have sexual intercourse when it is comfortable.  Refrain from any heavy lifting or straining until approved by your doctor. a. You may drive when you are no longer taking prescription pain medication, you can comfortably wear a seatbelt, and you can safely maneuver your car and apply brakes.  FOLLOW-UP 12. You should see your doctor in the office for a follow-up appointment approximately 2-3 weeks after your surgery.  You should have been given your post-op/follow-up appointment when your surgery was scheduled.  If you did not receive a post-op/follow-up appointment, make sure that you call for this appointment within a day or two after you arrive home to insure a convenient appointment time.  WHEN   TO CALL YOUR DOCTOR: 1. Fever over 101.0 2. Inability to urinate 3. Continued bleeding from incision. 4. Increased pain, redness, or drainage from the incision. 5. Increasing abdominal pain  The clinic staff is available to answer your questions during regular business hours.  Please don't  hesitate to call and ask to speak to one of the nurses for clinical concerns.  If you have a medical emergency, go to the nearest emergency room or call 911.  A surgeon from [facility] is always on call at the hospital. [address], [address], [place], [place]  [address] ? [address] [address], [place], [place]   [address] [phone] ? [phone] ? FAX [phone] Web site: [email or web address]  

## 2020-03-19 NOTE — Progress Notes (Signed)
BP 180/68; apresoline 5 mg given. Consulting civil engineer notified. Provider notified. Will continue to monitor.

## 2020-03-19 NOTE — Progress Notes (Signed)
Assessment & Plan: POD#1 - status post lap cholecystectomy - Dr. Daphine Deutscher  Advance to regular diet this AM  Encouraged OOB, ambulation  Anticipate discharge home after lunch today, 12/4 - per medical service  Rx for pain med placed to pharmacy  Follow up arranged at [facility] office        Darnell Level, MD       [facility]       Office: [phone]   Chief Complaint: Gallbladder  Subjective: Patient in bed, nurse at bedside.  Comfortable.  Denies significant pain.  Wants to eat and go home.  Objective: Vital signs in last 24 hours: Temp:  [97.2 F (36.2 C)-98.2 F (36.8 C)] 98 F (36.7 C) (12/04 0530) Pulse Rate:  [58-70] 69 (12/04 0848) Resp:  [15-22] 16 (12/04 0848) BP: (152-180)/(68-95) 178/92 (12/04 0848) SpO2:  [89 %-99 %] 95 % (12/04 0848) Weight:  [100.2 kg] 100.2 kg (12/03 1257) Last BM Date: 03/18/20  Intake/Output from previous day: 12/03 0701 - 12/04 0700 In: 2831.3 [P.O.:240; I.V.:2491.3; IV Piggyback:100] Out: 30 [Blood:30] Intake/Output this shift: No intake/output data recorded.  Physical Exam: HEENT - sclerae clear, mucous membranes moist Neck - soft Abdomen - protuberant, soft, minimal tenderness; wounds dry and intact Ext - no edema, non-tender Neuro - alert & oriented, no focal deficits  Lab Results:  Recent Labs    03/18/20 0520 03/19/20 0620  WBC 9.6 9.8  HGB 10.6* 10.8*  HCT 30.7* 31.4*  PLT 232 290   BMET Recent Labs    03/18/20 0520 03/19/20 0620  NA 134* 131*  K 3.9 4.3  CL 100 98  CO2 19* 19*  GLUCOSE 65* 248*  BUN 15 21  CREATININE 1.10 1.17  CALCIUM 8.5* 8.1*   PT/INR No results for input(s): LABPROT, INR in the last 72 hours. Comprehensive Metabolic Panel:    Component Value Date/Time   NA 131 (L) 03/19/2020 0620   NA 134 (L) 03/18/2020 0520   K 4.3 03/19/2020 0620   K 3.9 03/18/2020 0520   CL 98 03/19/2020 0620   CL 100 03/18/2020 0520   CO2 19 (L) 03/19/2020 0620   CO2 19 (L)  03/18/2020 0520   BUN 21 03/19/2020 0620   BUN 15 03/18/2020 0520   CREATININE 1.17 03/19/2020 0620   CREATININE 1.10 03/18/2020 0520   GLUCOSE 248 (H) 03/19/2020 0620   GLUCOSE 65 (L) 03/18/2020 0520   CALCIUM 8.1 (L) 03/19/2020 0620   CALCIUM 8.5 (L) 03/18/2020 0520   AST 37 03/19/2020 0620   AST 33 03/18/2020 0520   ALT 30 03/19/2020 0620   ALT 24 03/18/2020 0520   ALKPHOS 80 03/19/2020 0620   ALKPHOS 74 03/18/2020 0520   BILITOT 0.8 03/19/2020 0620   BILITOT 1.4 (H) 03/18/2020 0520   PROT 5.7 (L) 03/19/2020 0620   PROT 5.7 (L) 03/18/2020 0520   ALBUMIN 3.0 (L) 03/19/2020 0620   ALBUMIN 3.2 (L) 03/18/2020 0520    Studies/Results: NM Hepatobiliary Liver Func  Result Date: 03/17/2020 CLINICAL DATA:  Rule out cholecystitis. EXAM: NUCLEAR MEDICINE HEPATOBILIARY IMAGING TECHNIQUE: Sequential images of the abdomen were obtained out to 60 minutes following intravenous administration of radiopharmaceutical. RADIOPHARMACEUTICALS:  5.5 mCi Tc-73m  Choletec IV COMPARISON:  03/16/2020 FINDINGS: Prompt uptake and biliary excretion of activity by the liver is seen. Biliary activity passes into small bowel, consistent with patent common bile duct. After 1 hour no gallbladder activity was visualized. The patient subsequently received 3 mg of  morphine and imaging was carried out for an additional 30 minutes with filling of the gallbladder. IMPRESSION: 1. Delayed filling of the gallbladder after 1 hour and after administration of morphine sulfate. 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 2. These results will be called to the ordering clinician or representative by the Radiologist Assistant, and communication documented in the PACS or [employer]. Electronically Signed   By: Signa Kell M.D.   On: 03/17/2020 18:46      Darnell Level 03/19/2020  Patient ID: Allen Torres, male   DOB: [birth year], 79 y.o.   MRN: [id number]

## 2020-03-19 NOTE — Progress Notes (Signed)
Discharged via wc to front entrance accompanied by daughter and NT.verbalized understanding of dc instructions through teach back. No questions. Assessment unchanged.

## 2020-03-21 ENCOUNTER — Encounter (HOSPITAL_COMMUNITY): Payer: Self-pay | Admitting: Surgery

## 2020-03-21 LAB — SURGICAL PATHOLOGY

## 2020-03-21 NOTE — Anesthesia Postprocedure Evaluation (Signed)
Anesthesia Post Note  Patient: Allen Torres  Procedure(s) Performed: LAPAROSCOPIC CHOLECYSTECTOMY (N/A Abdomen)     Patient location during evaluation: PACU Anesthesia Type: General Level of consciousness: awake and alert Pain management: pain level controlled Vital Signs Assessment: post-procedure vital signs reviewed and stable Respiratory status: spontaneous breathing, nonlabored ventilation, respiratory function stable and patient connected to nasal cannula oxygen Cardiovascular status: blood pressure returned to baseline and stable Postop Assessment: no apparent nausea or vomiting Anesthetic complications: no   No complications documented.  Last Vitals:  Vitals:   03/19/20 0848 03/19/20 1031  BP: (!) 178/92 (!) 171/70  Pulse: 69 62  Resp: 16   Temp:    SpO2: 95%     Last Pain:  Vitals:   03/19/20 0850  TempSrc:   PainSc: 2    Pain Goal: Patients Stated Pain Goal: 2 (03/18/20 2146)                 Earl Lites P Anthonymichael Munday

## 2020-03-25 DIAGNOSIS — E278 Other specified disorders of adrenal gland: Secondary | ICD-10-CM | POA: Insufficient documentation

## 2020-03-25 DIAGNOSIS — E279 Disorder of adrenal gland, unspecified: Secondary | ICD-10-CM

## 2020-03-25 HISTORY — DX: Disorder of adrenal gland, unspecified: E27.9

## 2020-06-24 DIAGNOSIS — K760 Fatty (change of) liver, not elsewhere classified: Secondary | ICD-10-CM | POA: Insufficient documentation

## 2020-06-24 DIAGNOSIS — H903 Sensorineural hearing loss, bilateral: Secondary | ICD-10-CM | POA: Insufficient documentation

## 2020-06-24 DIAGNOSIS — H9193 Unspecified hearing loss, bilateral: Secondary | ICD-10-CM

## 2020-06-24 DIAGNOSIS — Z720 Tobacco use: Secondary | ICD-10-CM

## 2020-06-24 HISTORY — DX: Sensorineural hearing loss, bilateral: H90.3

## 2020-06-24 HISTORY — DX: Fatty (change of) liver, not elsewhere classified: K76.0

## 2020-06-24 HISTORY — DX: Unspecified hearing loss, bilateral: H91.93

## 2020-06-24 HISTORY — DX: Tobacco use: Z72.0

## 2021-06-01 ENCOUNTER — Encounter (HOSPITAL_COMMUNITY): Payer: Self-pay | Admitting: Emergency Medicine

## 2021-06-01 ENCOUNTER — Inpatient Hospital Stay (HOSPITAL_COMMUNITY)
Admission: EM | Admit: 2021-06-01 | Discharge: 2021-06-06 | DRG: 065 | Disposition: A | Discharge status: Rehabilitation Facility | Payer: Medicare HMO | Attending: Internal Medicine | Admitting: Internal Medicine

## 2021-06-01 ENCOUNTER — Other Ambulatory Visit: Payer: Self-pay

## 2021-06-01 ENCOUNTER — Emergency Department (HOSPITAL_COMMUNITY): Payer: Medicare HMO

## 2021-06-01 DIAGNOSIS — I129 Hypertensive chronic kidney disease with stage 1 through stage 4 chronic kidney disease, or unspecified chronic kidney disease: Secondary | ICD-10-CM | POA: Diagnosis present

## 2021-06-01 DIAGNOSIS — I10 Essential (primary) hypertension: Secondary | ICD-10-CM | POA: Diagnosis present

## 2021-06-01 DIAGNOSIS — D649 Anemia, unspecified: Secondary | ICD-10-CM | POA: Diagnosis present

## 2021-06-01 DIAGNOSIS — I6381 Other cerebral infarction due to occlusion or stenosis of small artery: Principal | ICD-10-CM | POA: Diagnosis present

## 2021-06-01 DIAGNOSIS — G8194 Hemiplegia, unspecified affecting left nondominant side: Secondary | ICD-10-CM | POA: Diagnosis present

## 2021-06-01 DIAGNOSIS — R471 Dysarthria and anarthria: Secondary | ICD-10-CM | POA: Diagnosis present

## 2021-06-01 DIAGNOSIS — I639 Cerebral infarction, unspecified: Principal | ICD-10-CM

## 2021-06-01 DIAGNOSIS — E871 Hypo-osmolality and hyponatremia: Secondary | ICD-10-CM

## 2021-06-01 DIAGNOSIS — Z79899 Other long term (current) drug therapy: Secondary | ICD-10-CM

## 2021-06-01 DIAGNOSIS — Z66 Do not resuscitate: Secondary | ICD-10-CM

## 2021-06-01 DIAGNOSIS — N183 Chronic kidney disease, stage 3 unspecified: Secondary | ICD-10-CM

## 2021-06-01 DIAGNOSIS — Z8673 Personal history of transient ischemic attack (TIA), and cerebral infarction without residual deficits: Secondary | ICD-10-CM

## 2021-06-01 DIAGNOSIS — Z20822 Contact with and (suspected) exposure to covid-19: Secondary | ICD-10-CM | POA: Diagnosis present

## 2021-06-01 DIAGNOSIS — R29810 Facial weakness: Secondary | ICD-10-CM | POA: Diagnosis present

## 2021-06-01 DIAGNOSIS — Z888 Allergy status to other drugs, medicaments and biological substances status: Secondary | ICD-10-CM

## 2021-06-01 DIAGNOSIS — N1831 Chronic kidney disease, stage 3a: Secondary | ICD-10-CM | POA: Diagnosis present

## 2021-06-01 DIAGNOSIS — N179 Acute kidney failure, unspecified: Secondary | ICD-10-CM | POA: Diagnosis present

## 2021-06-01 DIAGNOSIS — N1832 Chronic kidney disease, stage 3b: Secondary | ICD-10-CM | POA: Diagnosis present

## 2021-06-01 DIAGNOSIS — R29705 NIHSS score 5: Secondary | ICD-10-CM | POA: Diagnosis present

## 2021-06-01 DIAGNOSIS — E782 Mixed hyperlipidemia: Secondary | ICD-10-CM | POA: Diagnosis present

## 2021-06-01 HISTORY — DX: Do not resuscitate: Z66

## 2021-06-01 HISTORY — DX: Cerebral infarction, unspecified: I63.9

## 2021-06-01 HISTORY — DX: Personal history of transient ischemic attack (TIA), and cerebral infarction without residual deficits: Z86.73

## 2021-06-01 LAB — I-STAT CHEM 8, ED
BUN: 26 mg/dL — ABNORMAL HIGH (ref 8–23)
Calcium, Ion: 1.22 mmol/L (ref 1.15–1.40)
Chloride: 102 mmol/L (ref 98–111)
Creatinine, Ser: 1.4 mg/dL — ABNORMAL HIGH (ref 0.61–1.24)
Glucose, Bld: 184 mg/dL — ABNORMAL HIGH (ref 70–99)
HCT: 37 % — ABNORMAL LOW (ref 39.0–52.0)
Hemoglobin: 12.6 g/dL — ABNORMAL LOW (ref 13.0–17.0)
Potassium: 4.5 mmol/L (ref 3.5–5.1)
Sodium: 133 mmol/L — ABNORMAL LOW (ref 135–145)
TCO2: 21 mmol/L — ABNORMAL LOW (ref 22–32)

## 2021-06-01 LAB — CBC
HCT: 36.1 % — ABNORMAL LOW (ref 39.0–52.0)
Hemoglobin: 12.3 g/dL — ABNORMAL LOW (ref 13.0–17.0)
MCH: 33.4 pg (ref 26.0–34.0)
MCHC: 34.1 g/dL (ref 30.0–36.0)
MCV: 98.1 fL (ref 80.0–100.0)
Platelets: 198 10*3/uL (ref 150–400)
RBC: 3.68 MIL/uL — ABNORMAL LOW (ref 4.22–5.81)
RDW: 14.2 % (ref 11.5–15.5)
WBC: 6.1 10*3/uL (ref 4.0–10.5)
nRBC: 0 % (ref 0.0–0.2)

## 2021-06-01 LAB — URINALYSIS, ROUTINE W REFLEX MICROSCOPIC
Bilirubin Urine: NEGATIVE
Glucose, UA: NEGATIVE mg/dL
Hgb urine dipstick: NEGATIVE
Ketones, ur: NEGATIVE mg/dL
Leukocytes,Ua: NEGATIVE
Nitrite: NEGATIVE
Protein, ur: NEGATIVE mg/dL
Specific Gravity, Urine: 1.011 (ref 1.005–1.030)
pH: 5 (ref 5.0–8.0)

## 2021-06-01 LAB — BLOOD GAS, VENOUS
Acid-base deficit: 1.1 mmol/L (ref 0.0–2.0)
Bicarbonate: 23.5 mmol/L (ref 20.0–28.0)
O2 Saturation: 89.3 %
Patient temperature: 37
pCO2, Ven: 38 mmHg — ABNORMAL LOW (ref 44–60)
pH, Ven: 7.4 (ref 7.25–7.43)
pO2, Ven: 60 mmHg — ABNORMAL HIGH (ref 32–45)

## 2021-06-01 LAB — COMPREHENSIVE METABOLIC PANEL
ALT: 17 U/L (ref 0–44)
AST: 22 U/L (ref 15–41)
Albumin: 4.1 g/dL (ref 3.5–5.0)
Alkaline Phosphatase: 69 U/L (ref 38–126)
Anion gap: 7 (ref 5–15)
BUN: 31 mg/dL — ABNORMAL HIGH (ref 8–23)
CO2: 22 mmol/L (ref 22–32)
Calcium: 9.4 mg/dL (ref 8.9–10.3)
Chloride: 101 mmol/L (ref 98–111)
Creatinine, Ser: 1.33 mg/dL — ABNORMAL HIGH (ref 0.61–1.24)
GFR, Estimated: 54 mL/min — ABNORMAL LOW (ref >60)
Glucose, Bld: 186 mg/dL — ABNORMAL HIGH (ref 70–99)
Potassium: 4.4 mmol/L (ref 3.5–5.1)
Sodium: 130 mmol/L — ABNORMAL LOW (ref 135–145)
Total Bilirubin: 0.5 mg/dL (ref 0.3–1.2)
Total Protein: 6.9 g/dL (ref 6.5–8.1)

## 2021-06-01 LAB — RESP PANEL BY RT-PCR (FLU A&B, COVID) ARPGX2
Influenza A by PCR: NEGATIVE
Influenza B by PCR: NEGATIVE
SARS Coronavirus 2 by RT PCR: NEGATIVE

## 2021-06-01 LAB — PROTIME-INR
INR: 1.1 (ref 0.8–1.2)
Prothrombin Time: 14.1 seconds (ref 11.4–15.2)

## 2021-06-01 LAB — RAPID URINE DRUG SCREEN, HOSP PERFORMED
Amphetamines: NOT DETECTED
Barbiturates: NOT DETECTED
Benzodiazepines: NOT DETECTED
Cocaine: NOT DETECTED
Opiates: NOT DETECTED
Tetrahydrocannabinol: NOT DETECTED

## 2021-06-01 LAB — DIFFERENTIAL
Abs Immature Granulocytes: 0.01 10*3/uL (ref 0.00–0.07)
Basophils Absolute: 0 10*3/uL (ref 0.0–0.1)
Basophils Relative: 1 %
Eosinophils Absolute: 0.4 10*3/uL (ref 0.0–0.5)
Eosinophils Relative: 6 %
Immature Granulocytes: 0 %
Lymphocytes Relative: 27 %
Lymphs Abs: 1.6 10*3/uL (ref 0.7–4.0)
Monocytes Absolute: 0.8 10*3/uL (ref 0.1–1.0)
Monocytes Relative: 13 %
Neutro Abs: 3.3 10*3/uL (ref 1.7–7.7)
Neutrophils Relative %: 53 %

## 2021-06-01 LAB — ETHANOL: Alcohol, Ethyl (B): <10 mg/dL (ref <10)

## 2021-06-01 LAB — CBG MONITORING, ED: Glucose-Capillary: 146 mg/dL — ABNORMAL HIGH (ref 70–99)

## 2021-06-01 LAB — APTT: aPTT: 33 seconds (ref 24–36)

## 2021-06-01 MED ORDER — HEPARIN SODIUM (PORCINE) 5000 UNIT/ML IJ SOLN
5000.0000 [IU] | Freq: Three times a day (TID) | INTRAMUSCULAR | Status: DC
Start: 2021-06-02 — End: 2021-06-06
  Administered 2021-06-02 – 2021-06-06 (×14): 5000 [IU] via SUBCUTANEOUS
  Filled 2021-06-01 (×15): qty 1

## 2021-06-01 MED ORDER — SENNOSIDES-DOCUSATE SODIUM 8.6-50 MG PO TABS
1.0000 | ORAL_TABLET | Freq: Every evening | ORAL | Status: DC | PRN
  Administered 2021-06-05: 1 via ORAL
  Filled 2021-06-01: qty 1

## 2021-06-01 MED ORDER — IOHEXOL 350 MG/ML SOLN
100.0000 mL | Freq: Once | INTRAVENOUS | Status: AC | PRN
  Administered 2021-06-01: 100 mL via INTRAVENOUS

## 2021-06-01 MED ORDER — ACETAMINOPHEN 325 MG PO TABS: 650.0000 mg | ORAL_TABLET | ORAL | Status: DC | PRN

## 2021-06-01 MED ORDER — HEPARIN SODIUM (PORCINE) 5000 UNIT/ML IJ SOLN: 5000.0000 [IU] | Freq: Three times a day (TID) | INTRAMUSCULAR | Status: DC

## 2021-06-01 MED ORDER — ACETAMINOPHEN 650 MG RE SUPP: 650.0000 mg | RECTAL | Status: DC | PRN

## 2021-06-01 MED ORDER — DOXAZOSIN MESYLATE 2 MG PO TABS
2.0000 mg | ORAL_TABLET | Freq: Every evening | ORAL | Status: DC
Start: 2021-06-02 — End: 2021-06-04
  Administered 2021-06-02: 2 mg via ORAL
  Filled 2021-06-01 (×3): qty 1

## 2021-06-01 MED ORDER — ASPIRIN 325 MG PO TABS: 325.0000 mg | ORAL_TABLET | Freq: Every day | ORAL | Status: DC

## 2021-06-01 MED ORDER — ACETAMINOPHEN 160 MG/5ML PO SOLN: 650.0000 mg | ORAL | Status: DC | PRN

## 2021-06-01 MED ORDER — ASPIRIN 300 MG RE SUPP
300.0000 mg | Freq: Every day | RECTAL | Status: DC
  Filled 2021-06-01: qty 1

## 2021-06-01 MED ORDER — STROKE: EARLY STAGES OF RECOVERY BOOK
Freq: Once | Status: DC
  Filled 2021-06-01: qty 1

## 2021-06-01 MED ORDER — IOHEXOL 350 MG/ML SOLN
100.0000 mL | Freq: Once | INTRAVENOUS | Status: AC | PRN
  Administered 2021-06-01: 50 mL via INTRAVENOUS

## 2021-06-01 NOTE — ED Provider Triage Note (Signed)
Emergency Medicine Provider Triage Evaluation Note  Allen Torres , a 81 y.o. male  was evaluated in triage.  Pt complains of left-sided weakness. He has difficulty finding his words in addition to slurred speech raising concern for aphasia. The last known normal was 10 PM last night. I spoke with his daughter on the phone who states that she last talked with him at 10 PM last night and at that point speech and everything was normal. When she called him today at 530 he had a hard time finding his words and his words were slurred.   Physical Exam  BP (!) 157/76 (BP Location: Right Arm)    Pulse 71    Temp 98.7 F (37.1 C) (Oral)    Resp 15    Ht 5\' 11"  (1.803 m)    Wt 108.9 kg    SpO2 95%    BMI 33.47 kg/m  Gen:   Awake, no distress   Resp:  Normal effort  MSK:   Left arm and leg are weak.  Other:  There is obvious facial droop.  Left arm and leg are weak when compared to right. His speech is slurred, and he appears to have word finding difficulties in addition raising concern for aphasia.  Medical Decision Making  Medically screening exam initiated at 9:16 PM.  Appropriate orders placed.   Patient symptoms were first detected today at 1730, however his last known normal was his daughter who spoke with him last night at 10 PM.  He is still within the LVO window.  He is obviously weak on the left side and, in addition to his speech being slurred, appears to be having difficulty finding his words.    Code stroke is activated under LVO criteria.  I spoke with Dr. [name] who assumed care.   His airway was cleared at the time of my evaluation to go to CT scan.    Rubin Payor, Cristina Gong 06/01/21 2121

## 2021-06-01 NOTE — Assessment & Plan Note (Signed)
Hold diuretics and ACEI for now.

## 2021-06-01 NOTE — Assessment & Plan Note (Signed)
Due to CVA. °

## 2021-06-01 NOTE — Assessment & Plan Note (Signed)
Check lipid panel  

## 2021-06-01 NOTE — ACP (Advance Care Planning) (Signed)
°  Advance Care Planning  Reason for Advance Care Planning Conversation: Acute hospitalization Principal Problem:   Ischemic stroke [facility]) Active Problems:   Facial droop due to acute stroke ([place])   Dysarthria due to acute stroke ([place])   Hyponatremia   Essential hypertension   Benign hypertension with CKD (chronic kidney disease) stage III ([place])   Mixed hyperlipidemia   DNR (do not resuscitate)/DNI(Do not intubate)    I discussed with patient and dtr jill  about advance care planning. Specifically, we discussed whether patient would desire cardiopulmonary resuscitation (CPR) in the event of acute cardiopulmonary arrest. We also discussed whether endotracheal intubation and temporary ventilator life support would be desired in the event of acute cardio- or pulmonary decompensation.   Code status order of DNR has been entered in accord with the patient's wishes. no intubation  Living will: yes  [place] / Davonna [place]elling              Name: jill moore: Relationship to Patient: dtr   Is agent appointed in legal document no  Time spent today in ACP discussion was  5 mins  Kristopher Oppenheim, DO [facility]

## 2021-06-01 NOTE — Consult Note (Signed)
[facility] Tele[facility]rology Consult Services   Patient Name:   Allen Torres, Allen Torres Date of Birth:   03/15/1941 Identification Number:   MRN - [id number] Date of Service:   06/01/2021 21:33:39  Diagnosis:       I63.9 - Cerebrovascular accident (CVA), unspecified mechanism ([place])  Impression:      Patient presents with dysarthria and left-sided weakness. Symptoms concerning for right hemispheric stroke syndrome. Recommend admission to the hospital for neurovascular work-up. Outside the window for thrombolytics. Keep n.p.o. until speech evaluation considering his fairly significant dysarthria. Neuro follow-up recommended.  Our recommendations are outlined below.  Recommendations:        Stroke/Telemetry Floor       Neuro Checks       Bedside Swallow Eval       DVT Prophylaxis       IV Fluids, Normal Saline       Head of Bed 30 Degrees       Euglycemia and Avoid Hyperthermia (PRN Acetaminophen)       Aspirin per rectum       Antihypertensives PRN if Blood pressure is greater than 220/120 or there is a concern for End organ damage/contraindications for permissive HTN. If blood pressure is greater than 220/120 give labetalol PO or IV or Vasotec IV with a goal of 15% reduction in BP during the first 24 hours.       Toxic/metabolic/infx workup per ED including UA, UDS       MRI brain without IV contrast       If no cerebral blood vessel imaging done in ED (such as CTA), recommend MRA head/neck without contrast, or carotid ultrasound if cannot obtain MRA       TSH, A1c, lipid profile       Transthoracic echocardiogram       Continuous telemetry       Physical, occupational, and speech therapies       q4h neuro checks/NIHSS       NPO until bedside swallow       Neurology follow-up  Routine Consultation with [facility] for Follow up Care  Sign Out:       Discussed with Emergency Department  Provider    ------------------------------------------------------------------------------  Advanced Imaging: CTA Head and Neck Completed.  CTP Completed.  LVO:No  Patient doesn't meet criteria for emergent NIR consideration   Metrics: Last Known Well: 05/31/2021 22:30:00 [facility] Notification Time: 06/01/2021 21:33:39 Arrival Time: 06/01/2021 20:53:00 Stamp Time: 06/01/2021 21:33:39 Initial Response Time: 06/01/2021 21:36:26 Symptoms: Dysarthria, left facial droop. NIHSS Start Assessment Time: 06/01/2021 21:43:41 Patient is not a candidate for Thrombolytic. Thrombolytic Medical Decision: 06/01/2021 21:43:05 Patient was not deemed candidate for Thrombolytic because of following reasons: Last Well Known Above 4.5 Hours.  CT head showed no acute hemorrhage or acute core infarct.  ED Physician notified of diagnostic impression and management plan on 06/01/2021 21:48:28    ------------------------------------------------------------------------------  History of Present Illness: Patient is a 81 year old Male.  Patient was brought by private transportation with symptoms of Dysarthria, left facial droop. 81 year old male presents the hospital for left-sided weakness and dysarthria. Last known well was last night at 2230. Woke up this morning around breakfast noticed dysarthria. Family spoke with him and brought him to the hospital this evening. On exam he does appear to have left facial droop and mild left hemiparesis as well as fairly significant dysarthria. No aphasia or clear visual field cut.   Past Medical History:      Hypertension  Hyperlipidemia [place] PMH:  CKD  Medications:  No Anticoagulant use  No Antiplatelet use Reviewed EMR for current medications  Allergies:  Reviewed  Social History: Drug Use: No  Family History:  There is no family history of premature cerebrovascular disease pertinent to this consultation  ROS : 14 Points  Review of Systems was performed and was negative except mentioned in HPI.  Past Surgical History: There Is No Surgical History Contributory To Todays Visit    Examination: BP(157/76), Pulse(69), Blood Glucose(184) 1A: Level of Consciousness - Alert; keenly responsive + 0 1B: Ask Month and Age - Both Questions Right + 0 1C: Blink Eyes & Squeeze Hands - Performs Both Tasks + 0 2: Test Horizontal Extraocular Movements - Normal + 0 3: Test Visual Fields - No Visual Loss + 0 4: Test Facial Palsy (Use [place]im[place] if Obtunded) - Partial paralysis (lower face) + 2 5A: Test Left Arm Motor Drift - Drift, but doesn't hit bed + 1 5B: Test Right Arm Motor Drift - No Drift for 10 Seconds + 0 6A: Test Left Leg Motor Drift - Drift, but doesn't hit bed + 1 6B: Test Right Leg Motor Drift - No Drift for 5 Seconds + 0 7: Test Limb Ataxia (FNF/Heel-Shin) - No Ataxia + 0 8: Test Sensation - Normal; No sensory loss + 0 9: Test Language/Aphasia - Normal; No aphasia + 0 10: Test Dysarthria - Mild-Moderate Dysarthria: Slurring but can be understood + 1 11: Test Extinction/Inattention - No abnormality + 0  NIHSS Score: 5   Pre-Morbid Modified Rankin Scale: 0 Points = No symptoms at all   Patient/Family was informed the Neurology Consult would occur via TeleHealth consult by way of interactive audio and video telecommunications and consented to receiving care in this manner.   Patient is being evaluated for possible acute neurologic impairment and high probability of imminent or life-threatening deterioration. I spent total of 35 minutes providing care to this patient, including time for face to face visit via telemedicine, review of medical records, imaging studies and discussion of findings with providers, the patient and/or family.   Dr Knox Royalty   [facility] [phone]   Case [id number]

## 2021-06-01 NOTE — Assessment & Plan Note (Signed)
Due to CVA. Will need swallow eval by RN or ST.

## 2021-06-01 NOTE — Assessment & Plan Note (Signed)
Verified with pt. Witnessed by dtr jill

## 2021-06-01 NOTE — Assessment & Plan Note (Signed)
Observation telemetry bed at [facility].  Per neurology consult:   Stroke/Telemetry Floor       Neuro Checks       Bedside Swallow Eval       DVT Prophylaxis       Head of Bed 30 Degrees       Euglycemia and Avoid Hyperthermia (PRN Acetaminophen)       Aspirin per rectum       Antihypertensives PRN if Blood pressure is greater than 220/120 or there is a concern for End organ damage/contraindications for permissive HTN. If blood pressure is greater than 220/120 give labetalol PO or IV or Vasotec IV with a goal of 15% reduction in BP during the first 24 hours.       Toxic/metabolic/infx workup per ED including UA, UDS       MRI brain without IV contrast       TSH, A1c, lipid profile       Transthoracic echocardiogram       Continuous telemetry       Physical, occupational, and speech therapies       q4h neuro checks/NIHSS       NPO until bedside swallow       Neurology follow-up

## 2021-06-01 NOTE — ED Notes (Signed)
Save red and two gold tubes in main lab

## 2021-06-01 NOTE — H&P (Signed)
History and Physical    Allen Torres [id number] DOB: 1940-09-14 DOA: 06/01/2021  DOS: the patient was seen and examined on 06/01/2021  PCP: Patient, No Pcp Per (Inactive)   Patient coming from: Home  I have personally briefly reviewed patient's old medical records in [place]  CC: slurred speech, facial droop HPI: 81 yo WM with hx of HTN, presents to ER with left facial droop and speech.  Patient's last known normal was approximately 10 PM or 11 PM on 05/31/2021.  Patient's daughter Baker Janus states that she went to work early this morning.  Patient was still sleeping.  She states that when she got home around 5 or 6:00 in the evening, she noted that the patient had slurred speech, left-sided facial droop.  Patient states that around 10 or 11 AM today, he started having difficulty with walking.  He noticed his left side of his face felt numb.  He also had difficulty controlling his left arm.  He states that he fell into his recliner while trying to bend down.  Patient denies any headache.  Denies any vision changes.  On arrival to the ER temp 98.7 heart rate 71 blood pressure 157/76.  Labs show sodium 130 potassium 4.4 BUN of 31 creatinine 1.3  White count 6.1, hemoglobin 12.3, platelets 198  Urine drug abuse negative.  COVID and flu swabs are negative.  EKG showed normal sinus rhythm.  First-degree AV block  CT head demonstrated old small vessel infarctions of bilateral thalamus, basal ganglia/deep white matter.  CT angio head demonstrated no intracranial lesions or high-grade stenosis.  EDP discussed the case with stroke neurology on-call who wanted the patient transferred to [facility].  See their note for the recommendations.  [facility] hospitalist contacted for admission.   ED Course: CT head negative for acute CVA. Labs showed hyponatremia.  Review of Systems:  Review of Systems  Constitutional: Negative.   HENT: Negative.    Eyes: Negative.   Respiratory:  Negative.    Cardiovascular: Negative.   Gastrointestinal: Negative.   Genitourinary: Negative.   Musculoskeletal:  Positive for falls.  Skin: Negative.   Neurological:  Positive for speech change and weakness.       Left facial droop  Endo/Heme/Allergies: Negative.   Psychiatric/Behavioral: Negative.    All other systems reviewed and are negative.  Past Medical History:  Diagnosis Date   Hypertension    Renal disorder    Patient states, "chronic kidney disease"    Past Surgical History:  Procedure Laterality Date   BIOPSY  03/16/2020   Procedure: BIOPSY;  Surgeon: Yetta Flock, MD;  Location: [facility];  Service: Gastroenterology;;   CHOLECYSTECTOMY N/A 03/18/2020   Procedure: LAPAROSCOPIC CHOLECYSTECTOMY;  Surgeon: Johnathan Hausen, MD;  Location: [facility];  Service: General;  Laterality: N/A;   ESOPHAGOGASTRODUODENOSCOPY N/A 03/16/2020   Procedure: ESOPHAGOGASTRODUODENOSCOPY (EGD);  Surgeon: Yetta Flock, MD;  Location: [facility];  Service: Gastroenterology;  Laterality: N/A;   facial cystectomy       reports that he has quit smoking. He has never used smokeless tobacco. He reports that he does not drink alcohol and does not use drugs.  Allergies  Allergen Reactions   Colchicine Diarrhea   Hydrochlorothiazide     Other reaction(s): Other (See Comments) Loose stools   Indomethacin     "messed up my liver" Other reaction(s): Other (See Comments) Patient states that this messed up his liver     Statins Other (See Comments)  Patient states that he hurt so bad, hard to get up in the morning.  Felt like his system was shut down and under able to have a bowel movement    History reviewed. No pertinent family history.  Prior to Admission medications   Medication Sig Start Date End Date Taking? Authorizing Provider  acetaminophen (TYLENOL) 500 MG tablet Take 1,000 mg by mouth every 6 (six) hours as needed for moderate pain.   Yes [provider]   allopurinol (ZYLOPRIM) 100 MG tablet Take 200 mg by mouth daily. 02/09/20  Yes [provider]  Ascorbic Acid (VITAMIN C) 1000 MG tablet Take 1,000 mg by mouth daily.   Yes [provider]  atenolol (TENORMIN) 100 MG tablet Take 50-100 mg by mouth 2 (two) times daily. Take 1 tablet (100 mg) in the morning and 1/2 tablet (50 mg) at bedtime 02/09/20  Yes [provider]  chlorthalidone (HYGROTON) 25 MG tablet Take 25 mg by mouth daily. 04/04/21  Yes [provider]  cholecalciferol (VITAMIN D3) 25 MCG (1000 UNIT) tablet Take 1,000 Units by mouth daily.   Yes [provider]  doxazosin (CARDURA) 2 MG tablet Take 2 mg by mouth every evening.  02/09/20  Yes [provider]  fluticasone (FLONASE) 50 MCG/ACT nasal spray Place 1 spray into both nostrils daily as needed for allergies.  02/09/20  Yes [provider]  lisinopril (ZESTRIL) 40 MG tablet Take 40 mg by mouth daily.  02/09/20  Yes [provider]  vitamin B-12 (CYANOCOBALAMIN) 100 MCG tablet Take 100 mcg by mouth daily.   Yes [provider]  ondansetron (ZOFRAN ODT) 4 MG disintegrating tablet Take 1 tablet (4 mg total) by mouth every 8 (eight) hours as needed for nausea or vomiting. 03/11/20   Providence Lanius A, PA-C  zinc gluconate 50 MG tablet Take 50 mg by mouth daily.    [provider]    Physical Exam: Vitals:   06/01/21 2230 06/01/21 2300 06/01/21 2315 06/01/21 2330  BP: (!) 227/89 (!) 165/85 (!) 207/92 (!) 186/162  Pulse: 69 66 67 70  Resp: 18 17 20 20   Temp:      TempSrc:      SpO2: 99% 97% 99% 97%  Weight:      Height:        Physical Exam Nursing note reviewed.  Constitutional:      General: He is not in acute distress.    Appearance: Normal appearance. He is not ill-appearing, toxic-appearing or diaphoretic.  HENT:     Head: Normocephalic and atraumatic.  Cardiovascular:     Rate and Rhythm: Normal rate and regular rhythm.   Pulmonary:     Effort: Pulmonary effort is normal. No respiratory distress.  Abdominal:     General: Abdomen is flat. Bowel sounds are normal. There is no distension.     Tenderness: There is no abdominal tenderness. There is no guarding.  Musculoskeletal:     Right lower leg: No edema.     Left lower leg: No edema.  Skin:    General: Skin is warm and dry.     Capillary Refill: Capillary refill takes less than 2 seconds.  Neurological:     Mental Status: He is alert and oriented to person, place, and time.     Cranial Nerves: Facial asymmetry present.     Motor: No weakness.     Coordination: Coordination normal. Finger-Nose-Finger Test and Heel to [facility] Test normal. Rapid alternating movements normal.  Comments: Left sided facial droop. Notable left lower eyelid droop and left mouth droop      Labs on Admission: I have personally reviewed following labs and imaging studies  CBC: Recent Labs  Lab 06/01/21 2102 06/01/21 2110  WBC 6.1  --   NEUTROABS 3.3  --   HGB 12.3* 12.6*  HCT 36.1* 37.0*  MCV 98.1  --   PLT 198  --    Basic Metabolic Panel: Recent Labs  Lab 06/01/21 2102 06/01/21 2110  NA 130* 133*  K 4.4 4.5  CL 101 102  CO2 22  --   GLUCOSE 186* 184*  BUN 31* 26*  CREATININE 1.33* 1.40*  CALCIUM 9.4  --    GFR: Estimated Creatinine Clearance: 52.8 mL/min (A) (by C-G formula based on SCr of 1.4 mg/dL (H)). Liver Function Tests: Recent Labs  Lab 06/01/21 2102  AST 22  ALT 17  ALKPHOS 69  BILITOT 0.5  PROT 6.9  ALBUMIN 4.1   No results for input(s): LIPASE, AMYLASE in the last 168 hours. No results for input(s): AMMONIA in the last 168 hours. Coagulation Profile: Recent Labs  Lab 06/01/21 2102  INR 1.1   Cardiac Enzymes: No results for input(s): CKTOTAL, CKMB, CKMBINDEX, TROPONINI in the last 168 hours. BNP (last 3 results) No results for input(s): PROBNP in the last 8760 hours. HbA1C: No results for input(s): HGBA1C in the last 72  hours. CBG: Recent Labs  Lab 06/01/21 2136  GLUCAP 146*   Lipid Profile: No results for input(s): CHOL, HDL, LDLCALC, TRIG, CHOLHDL, LDLDIRECT in the last 72 hours. Thyroid Function Tests: No results for input(s): TSH, T4TOTAL, FREET4, T3FREE, THYROIDAB in the last 72 hours. Anemia Panel: No results for input(s): VITAMINB12, FOLATE, FERRITIN, TIBC, IRON, RETICCTPCT in the last 72 hours. Urine analysis:    Component Value Date/Time   COLORURINE YELLOW 06/01/2021 2145   APPEARANCEUR CLEAR 06/01/2021 2145   LABSPEC 1.011 06/01/2021 2145   PHURINE 5.0 06/01/2021 2145   GLUCOSEU NEGATIVE 06/01/2021 2145   HGBUR NEGATIVE 06/01/2021 2145   BILIRUBINUR NEGATIVE 06/01/2021 2145   [place] NEGATIVE 06/01/2021 2145   PROTEINUR NEGATIVE 06/01/2021 2145   NITRITE NEGATIVE 06/01/2021 2145   LEUKOCYTESUR NEGATIVE 06/01/2021 2145    Radiological Exams on Admission: I have personally reviewed images CT HEAD CODE STROKE WO CONTRAST  Result Date: 06/01/2021 CLINICAL DATA:  Code stroke. Neuro deficit, acute, stroke suspected. EXAM: CT HEAD WITHOUT CONTRAST TECHNIQUE: Contiguous axial images were obtained from the base of the skull through the vertex without intravenous contrast. RADIATION DOSE REDUCTION: This exam was performed according to the departmental dose-optimization program which includes automated exposure control, adjustment of the mA and/or kV according to patient size and/or use of iterative reconstruction technique. COMPARISON:  None. FINDINGS: Brain: No abnormality seen affecting the brainstem or cerebellum. Cerebral hemispheres show age related atrophy with old small vessel infarctions of the thalami, basal ganglia and hemispheric white matter. No sign of acute infarction, mass lesion, hemorrhage, hydrocephalus or extra-axial collection. Vascular: There is atherosclerotic calcification of the major vessels at the base of the brain. Skull: Negative Sinuses/Orbits: Clear/normal Other: None  ASPECTS ([employer] Stroke Program Early CT Score) - Ganglionic level infarction (caudate, lentiform nuclei, internal capsule, insula, M1-M3 cortex): 7 - Supraganglionic infarction (M4-M6 cortex): 3 Total score (0-10 with 10 being normal): 10 IMPRESSION: 1. No acute CT finding. 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 2. ASPECTS is 10. 3. These results were  called by telephone at the time of interpretation on 06/01/2021 at 9:20 pm to provider Dr. Alvino Chapel, who verbally acknowledged these results. Electronically Signed   By: Nelson Chimes M.D.   On: 06/01/2021 21:26   CT ANGIO HEAD NECK W WO CM W PERF (CODE STROKE)  Result Date: 06/01/2021 CLINICAL DATA:  CODE STROKE EXAM: CT ANGIOGRAPHY HEAD AND NECK CT PERFUSION BRAIN TECHNIQUE: Multidetector CT imaging of the head and neck was performed using the standard protocol during bolus administration of intravenous contrast. Multiplanar CT image reconstructions and MIPs were obtained to evaluate the vascular anatomy. Carotid stenosis measurements (when applicable) are obtained utilizing NASCET criteria, using the distal internal carotid diameter as the denominator. Multiphase CT imaging of the brain was performed following IV bolus contrast injection. Subsequent parametric perfusion maps were calculated using RAPID software. RADIATION DOSE REDUCTION: This exam was performed according to the departmental dose-optimization program which includes automated exposure control, adjustment of the mA and/or kV according to patient size and/or use of iterative reconstruction technique. CONTRAST:  148mL OMNIPAQUE IOHEXOL 350 MG/ML SOLN, 38mL OMNIPAQUE IOHEXOL 350 MG/ML SOLN COMPARISON:  None. FINDINGS: CTA NECK FINDINGS SKELETON: There is no bony spinal canal stenosis. No lytic or blastic lesion. OTHER NECK: Normal pharynx, larynx and major salivary glands. No cervical lymphadenopathy. Unremarkable thyroid gland. UPPER CHEST: No  pneumothorax or pleural effusion. No nodules or masses. AORTIC ARCH: There is calcific atherosclerosis of the aortic arch. There is decreased enhancement of the visualized portion of the descending aorta. The visualized proximal subclavian arteries are widely patent. There are numerous prominent collaterals in the posterior mediastinum and paraspinous musculature. RIGHT CAROTID SYSTEM: No dissection, occlusion or aneurysm. There is mixed density atherosclerosis extending into the proximal ICA, resulting in less than 50% stenosis. LEFT CAROTID SYSTEM: Normal without aneurysm, dissection or stenosis. VERTEBRAL ARTERIES: Right dominant configuration. Both origins are clearly patent. There is no dissection, occlusion or flow-limiting stenosis to the skull base (V1-V3 segments). CTA HEAD FINDINGS POSTERIOR CIRCULATION: --Vertebral arteries: Normal V4 segments. --Inferior cerebellar arteries: Normal. --Basilar artery: Normal. --Superior cerebellar arteries: Normal. --Posterior cerebral arteries (PCA): Normal. ANTERIOR CIRCULATION: --Intracranial internal carotid arteries: Normal. --Anterior cerebral arteries (ACA): Normal. Both A1 segments are present. Patent anterior communicating artery (a-comm). --Middle cerebral arteries (MCA): Normal. VENOUS SINUSES: As permitted by contrast timing, patent. ANATOMIC VARIANTS: None Review of the MIP images confirms the above findings. CT Brain Perfusion Findings: Perfusion data is unreliable due to motion artifacts and abnormal bolus characteristics. IMPRESSION: 1. No intracranial arterial occlusion or high-grade stenosis. 2. Unreliable perfusion data due to motion artifacts and abnormal bolus characteristics. 3. 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 Further characterization with CTA of the chest is recommended. 4. Mixed density right carotid bifurcation plaque with less than 50% stenosis. Aortic Atherosclerosis  (ICD10-I70.0). Electronically Signed   By: Ulyses Jarred M.D.   On: 06/01/2021 22:20    EKG: I have personally reviewed EKG: NSR, 1st degree AVB    Assessment/Plan Principal Problem:   Ischemic stroke (HCC) Active Problems:   Facial droop due to acute stroke ([place])   Dysarthria due to acute stroke ([place])   Hyponatremia   Essential hypertension   Benign hypertension with CKD (chronic kidney disease) stage III ([place])   Mixed hyperlipidemia   DNR (do not resuscitate)/DNI(Do not intubate)    Assessment and Plan: * Ischemic stroke ([place])- (present on admission) Observation telemetry bed at [facility].  Per neurology consult:   Stroke/Telemetry Floor  Neuro Checks       Bedside Swallow Eval       DVT Prophylaxis       Head of Bed 30 Degrees       Euglycemia and Avoid Hyperthermia (PRN Acetaminophen)       Aspirin per rectum       Antihypertensives PRN if Blood pressure is greater than 220/120 or there is a concern for End organ damage/contraindications for permissive HTN. If blood pressure is greater than 220/120 give labetalol PO or IV or Vasotec IV with a goal of 15% reduction in BP during the first 24 hours.       Toxic/metabolic/infx workup per ED including UA, UDS       MRI brain without IV contrast       TSH, A1c, lipid profile       Transthoracic echocardiogram       Continuous telemetry       Physical, occupational, and speech therapies       q4h neuro checks/NIHSS       NPO until bedside swallow       Neurology follow-up  Dysarthria due to acute stroke [facility]) Due to CVA. Will need swallow eval by RN or ST.  Facial droop due to acute stroke [facility]) Due to CVA  DNR (do not resuscitate)/DNI(Do not intubate) Verified with pt. Witnessed by dtr jill  Mixed hyperlipidemia- (present on admission) Check lipid panel.  Benign hypertension with CKD (chronic kidney disease) stage III ([place])- (present on admission) Hold diuretics and ACEI for now.  Essential hypertension- (present on  admission) Neurology wants permissive HTN  Hyponatremia Likely due to chlorthalidone. Stop chlorthalidone. Check TSH. Monitor BMP.   DVT prophylaxis: SQ Heparin Code Status: DNR/DNI(Do NOT Intubate) verified with pt. Witnessed by dtr jill Family Communication: discussed with pt and his dtr jill  Disposition Plan: return home  Consults called: neurology  Admission status: Observation, Telemetry bed   Kristopher Oppenheim, DO [facility] 06/01/2021, 11:43 PM

## 2021-06-01 NOTE — ED Provider Notes (Signed)
[facility] DEPT Provider Note   CSN: [id number] Arrival date & time: 06/01/21  2053     History  Chief Complaint  Patient presents with   Aphasia   Code Stroke    Allen Torres is a 81 y.o. male.  HPI Patient came in with difficulty speaking and left-sided weakness.  Seen and screened in triage and code stroke had been activated.  I saw patient initially in the scanner.  Has difficulty speaking.  Unsure of onset but last normal 10:00 last night.  Around 5:00 was seen by family members with difficulty speaking and left-sided weakness.  Eventually convinced to come to the ER.   Past Medical History:  Diagnosis Date   Hypertension    Renal disorder    Patient states, "chronic kidney disease"   Past Surgical History:  Procedure Laterality Date   BIOPSY  03/16/2020   Procedure: BIOPSY;  Surgeon: Yetta Flock, MD;  Location: [facility];  Service: Gastroenterology;;   CHOLECYSTECTOMY N/A 03/18/2020   Procedure: LAPAROSCOPIC CHOLECYSTECTOMY;  Surgeon: Johnathan Hausen, MD;  Location: [facility];  Service: General;  Laterality: N/A;   ESOPHAGOGASTRODUODENOSCOPY N/A 03/16/2020   Procedure: ESOPHAGOGASTRODUODENOSCOPY (EGD);  Surgeon: Yetta Flock, MD;  Location: [facility];  Service: Gastroenterology;  Laterality: N/A;   facial cystectomy       Home Medications Prior to Admission medications   Medication Sig Start Date End Date Taking? Authorizing Provider  acetaminophen (TYLENOL) 500 MG tablet Take 1,000 mg by mouth every 6 (six) hours as needed for moderate pain.   Yes [provider]  allopurinol (ZYLOPRIM) 100 MG tablet Take 200 mg by mouth daily. 02/09/20  Yes [provider]  Ascorbic Acid (VITAMIN C) 1000 MG tablet Take 1,000 mg by mouth daily.   Yes [provider]  atenolol (TENORMIN) 100 MG tablet Take 50-100 mg by mouth 2 (two) times daily. Take 1 tablet (100 mg) in the morning and 1/2 tablet (50 mg) at  bedtime 02/09/20  Yes [provider]  chlorthalidone (HYGROTON) 25 MG tablet Take 25 mg by mouth daily. 04/04/21  Yes [provider]  cholecalciferol (VITAMIN D3) 25 MCG (1000 UNIT) tablet Take 1,000 Units by mouth daily.   Yes [provider]  doxazosin (CARDURA) 2 MG tablet Take 2 mg by mouth every evening.  02/09/20  Yes [provider]  fluticasone (FLONASE) 50 MCG/ACT nasal spray Place 1 spray into both nostrils daily as needed for allergies.  02/09/20  Yes [provider]  lisinopril (ZESTRIL) 40 MG tablet Take 40 mg by mouth daily.  02/09/20  Yes [provider]  vitamin B-12 (CYANOCOBALAMIN) 100 MCG tablet Take 100 mcg by mouth daily.   Yes [provider]  ondansetron (ZOFRAN ODT) 4 MG disintegrating tablet Take 1 tablet (4 mg total) by mouth every 8 (eight) hours as needed for nausea or vomiting. 03/11/20   Providence Lanius A, PA-C  zinc gluconate 50 MG tablet Take 50 mg by mouth daily.    [provider]      Allergies    Colchicine, Hydrochlorothiazide, Indomethacin, and Statins    Review of Systems   Review of Systems  Neurological:  Positive for speech difficulty and weakness. Negative for headaches.   Physical Exam Updated Vital Signs BP (!) 165/85    Pulse 66    Temp 98.7 F (37.1 C) (Oral)    Resp 17    Ht 5\' 11"  (1.803 m)    Wt  108.9 kg    SpO2 97%    BMI 33.47 kg/m  Physical Exam Vitals and nursing note reviewed.  HENT:     Head: Atraumatic.  Eyes:     Extraocular Movements: Extraocular movements intact.     Pupils: Pupils are equal, round, and reactive to light.  Cardiovascular:     Rate and Rhythm: Regular rhythm.  Pulmonary:     Breath sounds: No wheezing or rhonchi.  Abdominal:     General: There is no distension.  Musculoskeletal:        General: No tenderness.     Cervical back: Neck supple.  Skin:    Capillary Refill: Capillary refill takes less than 2 seconds.  Neurological:      Mental Status: He is alert.     Comments: Left-sided facial droop.  Eye movements intact.  Slurred speech.  Able to raise both left and right arms but appears stronger in the right.  Also similar symptoms with legs.  Awake and able to answer questions.    ED Results / Procedures / Treatments   Labs (all labs ordered are listed, but only abnormal results are displayed) Labs Reviewed  CBC - Abnormal; Notable for the following components:      Result Value   RBC 3.68 (*)    Hemoglobin 12.3 (*)    HCT 36.1 (*)    All other components within normal limits  COMPREHENSIVE METABOLIC PANEL - Abnormal; Notable for the following components:   Sodium 130 (*)    Glucose, Bld 186 (*)    BUN 31 (*)    Creatinine, Ser 1.33 (*)    GFR, Estimated 54 (*)    All other components within normal limits  BLOOD GAS, VENOUS - Abnormal; Notable for the following components:   pCO2, Ven 38 (*)    pO2, Ven 60 (*)    All other components within normal limits  I-STAT CHEM 8, ED - Abnormal; Notable for the following components:   Sodium 133 (*)    BUN 26 (*)    Creatinine, Ser 1.40 (*)    Glucose, Bld 184 (*)    TCO2 21 (*)    Hemoglobin 12.6 (*)    HCT 37.0 (*)    All other components within normal limits  CBG MONITORING, ED - Abnormal; Notable for the following components:   Glucose-Capillary 146 (*)    All other components within normal limits  RESP PANEL BY RT-PCR (FLU A&B, COVID) ARPGX2  ETHANOL  PROTIME-INR  APTT  DIFFERENTIAL  RAPID URINE DRUG SCREEN, HOSP PERFORMED  URINALYSIS, ROUTINE W REFLEX MICROSCOPIC    EKG EKG Interpretation  Date/Time:  Thursday June 01 2021 21:03:36 EST Ventricular Rate:  70 PR Interval:  238 QRS Duration: 104 QT Interval:  377 QTC Calculation: 407 R Axis:   -25 Text Interpretation: Sinus rhythm Prolonged PR interval Probable left atrial enlargement Incomplete RBBB and LAFB Low voltage, precordial leads Abnormal R-wave progression, late transition Left  ventricular hypertrophy Confirmed by Davonna Belling [phone]) on 06/01/2021 10:35:08 PM  Radiology CT HEAD CODE STROKE WO CONTRAST  Result Date: 06/01/2021 CLINICAL DATA:  Code stroke. Neuro deficit, acute, stroke suspected. EXAM: CT HEAD WITHOUT CONTRAST TECHNIQUE: Contiguous axial images were obtained from the base of the skull through the vertex without intravenous contrast. RADIATION DOSE REDUCTION: This exam was performed according to the departmental dose-optimization program which includes automated exposure control, adjustment of the mA and/or kV according to patient size and/or use of iterative reconstruction  technique. COMPARISON:  None. FINDINGS: Brain: No abnormality seen affecting the brainstem or cerebellum. Cerebral hemispheres show age related atrophy with old small vessel infarctions of the thalami, basal ganglia and hemispheric white matter. No sign of acute infarction, mass lesion, hemorrhage, hydrocephalus or extra-axial collection. Vascular: There is atherosclerotic calcification of the major vessels at the base of the brain. Skull: Negative Sinuses/Orbits: Clear/normal Other: None ASPECTS ([place] Stroke Program Early CT Score) - Ganglionic level infarction (caudate, lentiform nuclei, internal capsule, insula, M1-M3 cortex): 7 - Supraganglionic infarction (M4-M6 cortex): 3 Total score (0-10 with 10 being normal): 10 IMPRESSION: 1. No acute CT finding. 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 2. ASPECTS is 10. 3. These results were called by telephone at the time of interpretation on 06/01/2021 at 9:20 pm to provider Dr. Alvino Chapel, who verbally acknowledged these results. Electronically Signed   By: Nelson Chimes M.D.   On: 06/01/2021 21:26   CT ANGIO HEAD NECK W WO CM W PERF (CODE STROKE)  Result Date: 06/01/2021 CLINICAL DATA:  CODE STROKE EXAM: CT ANGIOGRAPHY HEAD AND NECK CT PERFUSION BRAIN TECHNIQUE: Multidetector CT imaging of the  head and neck was performed using the standard protocol during bolus administration of intravenous contrast. Multiplanar CT image reconstructions and MIPs were obtained to evaluate the vascular anatomy. Carotid stenosis measurements (when applicable) are obtained utilizing NASCET criteria, using the distal internal carotid diameter as the denominator. Multiphase CT imaging of the brain was performed following IV bolus contrast injection. Subsequent parametric perfusion maps were calculated using RAPID software. RADIATION DOSE REDUCTION: This exam was performed according to the departmental dose-optimization program which includes automated exposure control, adjustment of the mA and/or kV according to patient size and/or use of iterative reconstruction technique. CONTRAST:  133mL OMNIPAQUE IOHEXOL 350 MG/ML SOLN, 63mL OMNIPAQUE IOHEXOL 350 MG/ML SOLN COMPARISON:  None. FINDINGS: CTA NECK FINDINGS SKELETON: There is no bony spinal canal stenosis. No lytic or blastic lesion. OTHER NECK: Normal pharynx, larynx and major salivary glands. No cervical lymphadenopathy. Unremarkable thyroid gland. UPPER CHEST: No pneumothorax or pleural effusion. No nodules or masses. AORTIC ARCH: There is calcific atherosclerosis of the aortic arch. There is decreased enhancement of the visualized portion of the descending aorta. The visualized proximal subclavian arteries are widely patent. There are numerous prominent collaterals in the posterior mediastinum and paraspinous musculature. RIGHT CAROTID SYSTEM: No dissection, occlusion or aneurysm. There is mixed density atherosclerosis extending into the proximal ICA, resulting in less than 50% stenosis. LEFT CAROTID SYSTEM: Normal without aneurysm, dissection or stenosis. VERTEBRAL ARTERIES: Right dominant configuration. Both origins are clearly patent. There is no dissection, occlusion or flow-limiting stenosis to the skull base (V1-V3 segments). CTA HEAD FINDINGS POSTERIOR CIRCULATION:  --Vertebral arteries: Normal V4 segments. --Inferior cerebellar arteries: Normal. --Basilar artery: Normal. --Superior cerebellar arteries: Normal. --Posterior cerebral arteries (PCA): Normal. ANTERIOR CIRCULATION: --Intracranial internal carotid arteries: Normal. --Anterior cerebral arteries (ACA): Normal. Both A1 segments are present. Patent anterior communicating artery (a-comm). --Middle cerebral arteries (MCA): Normal. VENOUS SINUSES: As permitted by contrast timing, patent. ANATOMIC VARIANTS: None Review of the MIP images confirms the above findings. CT Brain Perfusion Findings: Perfusion data is unreliable due to motion artifacts and abnormal bolus characteristics. IMPRESSION: 1. No intracranial arterial occlusion or high-grade stenosis. 2. Unreliable perfusion data due to motion artifacts and abnormal bolus characteristics. 3. 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 Further characterization with CTA of the chest is recommended. 4.  Mixed density right carotid bifurcation plaque with less than 50% stenosis. Aortic Atherosclerosis (ICD10-I70.0). Electronically Signed   By: Ulyses Jarred M.D.   On: 06/01/2021 22:20    Procedures Procedures    Medications Ordered in ED Medications  iohexol (OMNIPAQUE) 350 MG/ML injection 100 mL (100 mLs Intravenous Contrast Given 06/01/21 2139)  iohexol (OMNIPAQUE) 350 MG/ML injection 100 mL (50 mLs Intravenous Contrast Given 06/01/21 2140)    ED Course/ Medical Decision Making/ A&P                           Medical Decision Making Amount and/or Complexity of Data Reviewed Labs: ordered. Radiology: ordered and independent interpretation performed. Decision-making details documented in ED Course. ECG/medicine tests: independent interpretation performed.  Risk Prescription drug management. Decision regarding hospitalization.  Critical Care Total time providing critical care: 30-74  minutes  Patient presents with focal neurodeficits.Initial differential diagnosis includes life-threatening conditions such as stroke.  Last normal was around 10:00 last night so not a tPA candidate however potentially could be LVO and interventional candidate.  Code stroke had been called.  Head CT done and initially reassuring.  Independently interpreted by me.  Seen by teleneurology.  Not a tPA candidate but likely did have a stroke.  No large vessel occlusion and no acute infarct seen on perfusion.  Lab work reviewed and overall reassuring.  Will require admission the hospital.  Patient did not excite about admission to the hospital but he has been convinced to stay.  Will discuss with hospitalist and have discussed with teleneurology.        Final Clinical Impression(s) / ED Diagnoses Final diagnoses:  None    Rx / DC Orders ED Discharge Orders     None         Davonna Belling, MD 06/01/21 2318

## 2021-06-01 NOTE — Assessment & Plan Note (Signed)
Likely due to chlorthalidone. Stop chlorthalidone. Check TSH. Monitor BMP.

## 2021-06-01 NOTE — Assessment & Plan Note (Signed)
Neurology wants permissive HTN

## 2021-06-01 NOTE — Subjective & Objective (Signed)
CC: slurred speech, facial droop HPI: 81 yo WM with hx of HTN, presents to ER with left facial droop and speech.  Patient's last known normal was approximately 10 PM or 11 PM on 05/31/2021.  Patient's daughter Baker Janus states that she went to work early this morning.  Patient was still sleeping.  She states that when she got home around 5 or 6:00 in the evening, she noted that the patient had slurred speech, left-sided facial droop.  Patient states that around 10 or 11 AM today, he started having difficulty with walking.  He noticed his left side of his face felt numb.  He also had difficulty controlling his left arm.  He states that he fell into his recliner while trying to bend down.  Patient denies any headache.  Denies any vision changes.  On arrival to the ER temp 98.7 heart rate 71 blood pressure 157/76.  Labs show sodium 130 potassium 4.4 BUN of 31 creatinine 1.3  White count 6.1, hemoglobin 12.3, platelets 198  Urine drug abuse negative.  COVID and flu swabs are negative.  EKG showed normal sinus rhythm.  First-degree AV block  CT head demonstrated old small vessel infarctions of bilateral thalamus, basal ganglia/deep white matter.  CT angio head demonstrated no intracranial lesions or high-grade stenosis.  EDP discussed the case with stroke neurology on-call who wanted the patient transferred to [facility].  See their note for the recommendations.  [facility] hospitalist contacted for admission.

## 2021-06-02 ENCOUNTER — Observation Stay (HOSPITAL_COMMUNITY): Payer: Medicare HMO

## 2021-06-02 DIAGNOSIS — Z20822 Contact with and (suspected) exposure to covid-19: Secondary | ICD-10-CM | POA: Diagnosis present

## 2021-06-02 DIAGNOSIS — Z79899 Other long term (current) drug therapy: Secondary | ICD-10-CM | POA: Diagnosis not present

## 2021-06-02 DIAGNOSIS — Z7902 Long term (current) use of antithrombotics/antiplatelets: Secondary | ICD-10-CM | POA: Diagnosis not present

## 2021-06-02 DIAGNOSIS — N179 Acute kidney failure, unspecified: Secondary | ICD-10-CM | POA: Diagnosis present

## 2021-06-02 DIAGNOSIS — Z7982 Long term (current) use of aspirin: Secondary | ICD-10-CM | POA: Diagnosis not present

## 2021-06-02 DIAGNOSIS — K5900 Constipation, unspecified: Secondary | ICD-10-CM | POA: Diagnosis present

## 2021-06-02 DIAGNOSIS — Z823 Family history of stroke: Secondary | ICD-10-CM | POA: Diagnosis not present

## 2021-06-02 DIAGNOSIS — N1831 Chronic kidney disease, stage 3a: Secondary | ICD-10-CM | POA: Diagnosis present

## 2021-06-02 DIAGNOSIS — I6381 Other cerebral infarction due to occlusion or stenosis of small artery: Secondary | ICD-10-CM | POA: Diagnosis present

## 2021-06-02 DIAGNOSIS — R29810 Facial weakness: Secondary | ICD-10-CM | POA: Diagnosis present

## 2021-06-02 DIAGNOSIS — N1832 Chronic kidney disease, stage 3b: Secondary | ICD-10-CM | POA: Diagnosis present

## 2021-06-02 DIAGNOSIS — Z888 Allergy status to other drugs, medicaments and biological substances status: Secondary | ICD-10-CM | POA: Diagnosis not present

## 2021-06-02 DIAGNOSIS — E871 Hypo-osmolality and hyponatremia: Secondary | ICD-10-CM | POA: Diagnosis present

## 2021-06-02 DIAGNOSIS — I69328 Other speech and language deficits following cerebral infarction: Secondary | ICD-10-CM | POA: Diagnosis not present

## 2021-06-02 DIAGNOSIS — N401 Enlarged prostate with lower urinary tract symptoms: Secondary | ICD-10-CM | POA: Diagnosis present

## 2021-06-02 DIAGNOSIS — G8194 Hemiplegia, unspecified affecting left nondominant side: Secondary | ICD-10-CM | POA: Diagnosis present

## 2021-06-02 DIAGNOSIS — Z66 Do not resuscitate: Secondary | ICD-10-CM | POA: Diagnosis present

## 2021-06-02 DIAGNOSIS — I69334 Monoplegia of upper limb following cerebral infarction affecting left non-dominant side: Secondary | ICD-10-CM | POA: Diagnosis present

## 2021-06-02 DIAGNOSIS — E782 Mixed hyperlipidemia: Secondary | ICD-10-CM | POA: Diagnosis present

## 2021-06-02 DIAGNOSIS — I129 Hypertensive chronic kidney disease with stage 1 through stage 4 chronic kidney disease, or unspecified chronic kidney disease: Secondary | ICD-10-CM | POA: Diagnosis present

## 2021-06-02 DIAGNOSIS — R682 Dry mouth, unspecified: Secondary | ICD-10-CM | POA: Diagnosis present

## 2021-06-02 DIAGNOSIS — Z6829 Body mass index (BMI) 29.0-29.9, adult: Secondary | ICD-10-CM | POA: Diagnosis not present

## 2021-06-02 DIAGNOSIS — M109 Gout, unspecified: Secondary | ICD-10-CM | POA: Diagnosis present

## 2021-06-02 DIAGNOSIS — R29705 NIHSS score 5: Secondary | ICD-10-CM | POA: Diagnosis present

## 2021-06-02 DIAGNOSIS — R338 Other retention of urine: Secondary | ICD-10-CM | POA: Diagnosis present

## 2021-06-02 DIAGNOSIS — I6389 Other cerebral infarction: Secondary | ICD-10-CM | POA: Diagnosis not present

## 2021-06-02 DIAGNOSIS — N183 Chronic kidney disease, stage 3 unspecified: Secondary | ICD-10-CM | POA: Diagnosis not present

## 2021-06-02 DIAGNOSIS — E785 Hyperlipidemia, unspecified: Secondary | ICD-10-CM | POA: Diagnosis present

## 2021-06-02 DIAGNOSIS — I10 Essential (primary) hypertension: Secondary | ICD-10-CM | POA: Diagnosis not present

## 2021-06-02 DIAGNOSIS — D649 Anemia, unspecified: Secondary | ICD-10-CM | POA: Diagnosis present

## 2021-06-02 DIAGNOSIS — R4586 Emotional lability: Secondary | ICD-10-CM | POA: Diagnosis present

## 2021-06-02 DIAGNOSIS — R471 Dysarthria and anarthria: Secondary | ICD-10-CM | POA: Diagnosis present

## 2021-06-02 DIAGNOSIS — Z87891 Personal history of nicotine dependence: Secondary | ICD-10-CM | POA: Diagnosis not present

## 2021-06-02 DIAGNOSIS — E663 Overweight: Secondary | ICD-10-CM | POA: Diagnosis present

## 2021-06-02 DIAGNOSIS — I69392 Facial weakness following cerebral infarction: Secondary | ICD-10-CM | POA: Diagnosis not present

## 2021-06-02 DIAGNOSIS — I639 Cerebral infarction, unspecified: Secondary | ICD-10-CM | POA: Diagnosis present

## 2021-06-02 LAB — COMPREHENSIVE METABOLIC PANEL
ALT: 15 U/L (ref 0–44)
AST: 17 U/L (ref 15–41)
Albumin: 3.8 g/dL (ref 3.5–5.0)
Alkaline Phosphatase: 64 U/L (ref 38–126)
Anion gap: 9 (ref 5–15)
BUN: 29 mg/dL — ABNORMAL HIGH (ref 8–23)
CO2: 23 mmol/L (ref 22–32)
Calcium: 9.4 mg/dL (ref 8.9–10.3)
Chloride: 100 mmol/L (ref 98–111)
Creatinine, Ser: 1.26 mg/dL — ABNORMAL HIGH (ref 0.61–1.24)
GFR, Estimated: 58 mL/min — ABNORMAL LOW (ref >60)
Glucose, Bld: 90 mg/dL (ref 70–99)
Potassium: 4.1 mmol/L (ref 3.5–5.1)
Sodium: 132 mmol/L — ABNORMAL LOW (ref 135–145)
Total Bilirubin: 0.7 mg/dL (ref 0.3–1.2)
Total Protein: 6.6 g/dL (ref 6.5–8.1)

## 2021-06-02 LAB — CBC WITH DIFFERENTIAL/PLATELET
Abs Immature Granulocytes: 0.02 10*3/uL (ref 0.00–0.07)
Basophils Absolute: 0 10*3/uL (ref 0.0–0.1)
Basophils Relative: 1 %
Eosinophils Absolute: 0.4 10*3/uL (ref 0.0–0.5)
Eosinophils Relative: 6 %
HCT: 33.3 % — ABNORMAL LOW (ref 39.0–52.0)
Hemoglobin: 11.6 g/dL — ABNORMAL LOW (ref 13.0–17.0)
Immature Granulocytes: 0 %
Lymphocytes Relative: 29 %
Lymphs Abs: 1.9 10*3/uL (ref 0.7–4.0)
MCH: 33.5 pg (ref 26.0–34.0)
MCHC: 34.8 g/dL (ref 30.0–36.0)
MCV: 96.2 fL (ref 80.0–100.0)
Monocytes Absolute: 0.9 10*3/uL (ref 0.1–1.0)
Monocytes Relative: 13 %
Neutro Abs: 3.3 10*3/uL (ref 1.7–7.7)
Neutrophils Relative %: 51 %
Platelets: 179 10*3/uL (ref 150–400)
RBC: 3.46 MIL/uL — ABNORMAL LOW (ref 4.22–5.81)
RDW: 14.1 % (ref 11.5–15.5)
WBC: 6.5 10*3/uL (ref 4.0–10.5)
nRBC: 0 % (ref 0.0–0.2)

## 2021-06-02 LAB — LIPID PANEL
Cholesterol: 218 mg/dL — ABNORMAL HIGH (ref 0–200)
HDL: 29 mg/dL — ABNORMAL LOW (ref >40)
LDL Cholesterol: 132 mg/dL — ABNORMAL HIGH (ref 0–99)
Total CHOL/HDL Ratio: 7.5 RATIO
Triglycerides: 287 mg/dL — ABNORMAL HIGH (ref <150)
VLDL: 57 mg/dL — ABNORMAL HIGH (ref 0–40)

## 2021-06-02 LAB — HEMOGLOBIN A1C
Hgb A1c MFr Bld: 6.1 % — ABNORMAL HIGH (ref 4.8–5.6)
Mean Plasma Glucose: 128.37 mg/dL

## 2021-06-02 LAB — TSH: TSH: 2.587 u[IU]/mL (ref 0.350–4.500)

## 2021-06-02 LAB — MAGNESIUM: Magnesium: 1.6 mg/dL — ABNORMAL LOW (ref 1.7–2.4)

## 2021-06-02 MED ORDER — MAGNESIUM SULFATE 2 GM/50ML IV SOLN
2.0000 g | Freq: Once | INTRAVENOUS | Status: AC
  Administered 2021-06-02: 2 g via INTRAVENOUS
  Filled 2021-06-02: qty 50

## 2021-06-02 NOTE — ED Notes (Signed)
Patients linens changed. Patients primofit and condom cath both "came off." Patient said he is tired of being here laying around in the bed all the time.

## 2021-06-02 NOTE — Evaluation (Signed)
Occupational Therapy Evaluation Patient Details Name: Allen Torres MRN: [id number] DOB: [birth year] Today's Date: 06/02/2021   History of Present Illness Patient is an 81 year old male presenting to ED with left facial droop and speech. MRI reading Small area of acute/subacute ischemia in the right centrum  semiovale. PMH: HTN   Clinical Impression   Patient lives with daughter at home with bedroom on main level. At baseline independent with self care and does not use an adaptive device. Patient presenting with left upper extremity weakness, decreased coordination and appears to have left inattention. Upon arrival to room left arm hanging off gurney and through the bed rail however when patient wakes up and attempts to sit up in bed does not appear to notice. Patient also running walker into bed on left side and unable problem solve. Patient with decreased sitting and standing balance needing max A for lower body dressing and min A with functional ambulation in room for safety and balance. Recommend continued acute OT services to maximize patient safety and independence with self care in order to facilitate D/C to venue listed below.       Recommendations for follow up therapy are one component of a multi-disciplinary discharge planning process, led by the attending physician.  Recommendations may be updated based on patient status, additional functional criteria and insurance authorization.   Follow Up Recommendations  Acute inpatient rehab (3hours/day)    Assistance Recommended at Discharge Frequent or constant Supervision/Assistance  Patient can return home with the following A little help with walking and/or transfers;A lot of help with bathing/dressing/bathroom;Assistance with cooking/housework;Help with stairs or ramp for entrance;Assist for transportation;Direct supervision/assist for medications management;Direct supervision/assist for financial management    Functional Status  Assessment  Patient has had a recent decline in their functional status and demonstrates the ability to make significant improvements in function in a reasonable and predictable amount of time.  Equipment Recommendations  Tub/shower seat    Recommendations for Other Services Rehab consult     Precautions / Restrictions Precautions Precautions: Fall Restrictions Weight Bearing Restrictions: No      Mobility Bed Mobility Overal bed mobility: Needs Assistance Bed Mobility: Supine to Sit, Sit to Supine     Supine to sit: Max assist, HOB elevated Sit to supine: Mod assist   General bed mobility comments: Cue patient to sit up at edge of bed. Appearing to have difficulty motor planning trying to sit up with legs on the bed. When cued to bring legs off edge having difficulty sequencing needing max A.        Balance Overall balance assessment: Needs assistance Sitting-balance support: Feet supported Sitting balance-Leahy Scale: Poor Sitting balance - Comments: needing up to min A at times due to posterior loss of balance   Standing balance support: Bilateral upper extremity supported, During functional activity Standing balance-Leahy Scale: Poor                             ADL either performed or assessed with clinical judgement   ADL Overall ADL's : Needs assistance/impaired     Grooming: Supervision/safety;Sitting   Upper Body Bathing: Min guard;Sitting   Lower Body Bathing: Maximal assistance;Sitting/lateral leans;Sit to/from stand Lower Body Bathing Details (indicate cue type and reason): due to impaired sitting and standing balance Upper Body Dressing : Min guard;Sitting   Lower Body Dressing: Maximal assistance;Sitting/lateral leans;Sit to/from stand Lower Body Dressing Details (indicate cue type and reason): Have patient  attempt donning socks has posterior loss of balance trying to don R sock and getting stuck on toe nails. Patient unable to complete  figure 4 method to don L sock needing total A and again difficulty maintaining sitting balance even to lift foot up for OT to don sock. Toilet Transfer: Minimal assistance;Ambulation;Rolling walker (2 wheels) Toilet Transfer Details (indicate cue type and reason): Patient min A throughout transfer and ambulation in room with walker for steadying assistance. Patient running walker into bed and appears to have some left inattention Toileting- Clothing Manipulation and Hygiene: Maximal assistance;Sit to/from stand Toileting - Clothing Manipulation Details (indicate cue type and reason): To don clean brief, limited standing balance needing to hold onto walker.     Functional mobility during ADLs: Minimal assistance;Cueing for safety;Cueing for sequencing;Rolling walker (2 wheels) General ADL Comments: Patient needing increased assistance with self care tasks due to impaired balance, coordination, strength, safety awareness     Vision Baseline Vision/History: 1 Wears glasses Ability to See in Adequate Light: 1 Impaired Vision Assessment?: Yes Eye Alignment: Within Functional Limits Visual Fields: Impaired-to be further tested in functional context Additional Comments: Patient reports he wears glasses for reading/difficulty with near sight although within the past year has been wearing his glasses more all the time due to vision difficulties. States he has not had eye exam in "some time."            Pertinent Vitals/Pain Pain Assessment Pain Assessment: No/denies pain     Hand Dominance Right   Extremity/Trunk Assessment Upper Extremity Assessment Upper Extremity Assessment: RUE deficits/detail;LUE deficits/detail RUE Deficits / Details: AROM grossly intact biceps 4-/5 triceps 4/5 grip 4+/5 RUE Coordination: WNL LUE Deficits / Details: AROM grossly intact, biceps 3+/5, triceps 4-/5, grip 4/5 LUE Coordination: decreased fine motor   Lower Extremity Assessment Lower Extremity Assessment:  Defer to PT evaluation       Communication Communication Communication: Expressive difficulties   Cognition Arousal/Alertness: Awake/alert Behavior During Therapy: WFL for tasks assessed/performed Overall Cognitive Status: Impaired/Different from baseline Area of Impairment: Problem solving                             Problem Solving: Difficulty sequencing General Comments: Patient having difficulty with motor planning getting out of bed. Also unable to problem solve navigating walker around bed running into frame possibly due to some left inattention                Home Living Family/patient expects to be discharged to:: Private residence Living Arrangements: Children (daughter) Available Help at Discharge: Family Type of Home: House Home Access: Stairs to enter;Ramped entrance     Home Layout: Able to live on main level with bedroom/bathroom     Bathroom Shower/Tub: Tub/shower unit         Home Equipment: None          Prior Functioning/Environment Prior Level of Function : Independent/Modified Independent                        OT Problem List: Decreased strength;Decreased activity tolerance;Impaired balance (sitting and/or standing);Decreased coordination;Impaired vision/perception;Decreased safety awareness;Impaired UE functional use;Decreased cognition      OT Treatment/Interventions: Self-care/ADL training;Neuromuscular education;Therapeutic activities;Patient/family education;Balance training;Cognitive remediation/compensation;Therapeutic exercise;Visual/perceptual remediation/compensation    OT Goals(Current goals can be found in the care plan section) Acute Rehab OT Goals Patient Stated Goal: get out of here OT Goal Formulation: With patient Time For  Goal Achievement: 06/16/21 Potential to Achieve Goals: Good  OT Frequency: Min 2X/week    Co-evaluation PT/OT/SLP Co-Evaluation/Treatment: Yes Reason for Co-Treatment: To address  functional/ADL transfers PT goals addressed during session: Mobility/safety with mobility OT goals addressed during session: ADL's and self-care      AM-PAC OT "6 Clicks" Daily Activity     Outcome Measure Help from another person eating meals?: A Little Help from another person taking care of personal grooming?: A Little Help from another person toileting, which includes using toliet, bedpan, or urinal?: A Lot Help from another person bathing (including washing, rinsing, drying)?: A Lot Help from another person to put on and taking off regular upper body clothing?: A Little Help from another person to put on and taking off regular lower body clothing?: A Lot 6 Click Score: 15   End of Session Equipment Utilized During Treatment: Gait belt;Rolling walker (2 wheels) Nurse Communication: Mobility status  Activity Tolerance: Patient tolerated treatment well Patient left: in bed;with call bell/phone within reach  OT Visit Diagnosis: Unsteadiness on feet (R26.81);Other abnormalities of gait and mobility (R26.89);Muscle weakness (generalized) (M62.81);Other symptoms and signs involving cognitive function;Other symptoms and signs involving the nervous system (R29.898)                [id number] OT Time Calculation (min): 26 min Charges:  OT General Charges $OT Visit: 1 Visit OT Evaluation $OT Eval Low Complexity: [place] OT OT pager: [phone]   Rosemary Holms 06/02/2021, 12:23 PM

## 2021-06-02 NOTE — Progress Notes (Incomplete)
{  Select Note:[id number]} 

## 2021-06-02 NOTE — Progress Notes (Signed)
PROGRESS NOTE    GAMALIER OSTHOFF  [id number] DOB: 05-08-1940 DOA: 06/01/2021 PCP: Patient, No Pcp Per (Inactive)   Brief Narrative:  Patient is 81 year old male with past medical history of hypertension, chronic kidney disease, hyperlipidemia presented with left-sided weakness and dysarthria.  On arrival to the ER temp 98.7 heart rate 71 blood pressure 157/76. Labs show sodium 130 potassium 4.4 BUN of 31 creatinine 1.3. White count 6.1, hemoglobin 12.3, platelets 198. Urine drug abuse negative. COVID and flu swabs are negative. EKG showed normal sinus rhythm.  First-degree AV block. CT head demonstrated old small vessel infarctions of bilateral thalamus, basal ganglia/deep white matter.CT angio head demonstrated no intracranial lesions or high-grade stenosis.   EDP discussed the case with stroke neurology on-call who wanted the patient transferred to [facility].  Assessment & Plan:  Acute ischemic stroke: -Patient presented with left-sided weakness and slurred speech and facial drop. -MRI brain shows small area of acute/subacute ischemia in the right centrum semiovale.  No hemorrhage or mass effect.,  UA, UDS: Negative.  TSH: WNL -CT angio of head and neck shows no intracranial occlusion or stenosis. -On telemetry -Allow permissive hypertension.  A1c 6.1, total cholesterol 218, LDL: 132 -Transthoracic echo is pending. -PT/OT/SLP evaluation -OT recommended CIR -On fall precautions.  Every 4 neurochecks. -Patient will be transferred to [facility] as soon as beds are available for further stroke work-up-neurology is aware.  Hyperlipidemia: Continue statin  AKI: -Creatinine 1.26, GFR: 58.  Continue to monitor.  Avoid nephrotoxic medications.  Hypertension: Hold antihypertensive at this time to allow permissive hypertension  Hyponatremia likely secondary to chlorthalidone.  Continue to monitor TSH: WNL.  Hypomagnesemia: Replenished.  Repeat magnesium level tomorrow  a.m.  Normocytic anemia: H&H is stable.  Continue to monitor.   DVT prophylaxis: Heparin Code Status: DNR Family Communication:  None present at bedside.  Plan of care discussed with patient in length and he verbalized understanding and agreed with it.  I called patient's daughter and discussed plan of care and she verbalized understanding  Disposition Plan: CIR when stable  Consultants:  Neurology  Procedures:  None  Antimicrobials:  None  Status is: Observation    Subjective: Patient seen and examined in ED.  He is upset that he is hungry and he is not getting breakfast.  He is alert and following commands.  As per nurse: Patient failed bedside swallow evaluation therefore SLP is consulted.  Objective: Vitals:   06/02/21 0600 06/02/21 0914 06/02/21 1134 06/02/21 1200  BP: 125/61 (!) 164/68 (!) 164/86 137/78  Pulse: 67 67 72 74  Resp: 14 16 16 18   Temp:      TempSrc:      SpO2: 100% 94% 96% 95%  Weight:      Height:        Intake/Output Summary (Last 24 hours) at 06/02/2021 1248 Last data filed at 06/02/2021 0825 Gross per 24 hour  Intake 50 ml  Output --  Net 50 ml   Filed Weights   06/01/21 2114  Weight: 108.9 kg    Examination:  General exam: Appears calm and comfortable, on room air, has slurred speech  respiratory system: Clear to auscultation. Respiratory effort normal. Cardiovascular system: S1 & S2 heard, RRR. No JVD, murmurs, rubs, gallops or clicks. No pedal edema. Gastrointestinal system: Abdomen is nondistended, soft and nontender. No organomegaly or masses felt. Normal bowel sounds heard. Central nervous system: Alert and oriented.  Left-sided facial drop, slurred speech extremities: Symmetric power in all 4  extremities. Skin: No rashes, lesions or ulcers   Data Reviewed: I have personally reviewed following labs and imaging studies  CBC: Recent Labs  Lab 06/01/21 2102 06/01/21 2110 06/02/21 0211  WBC 6.1  --  6.5  NEUTROABS 3.3   --  3.3  HGB 12.3* 12.6* 11.6*  HCT 36.1* 37.0* 33.3*  MCV 98.1  --  96.2  PLT 198  --  0000000   Basic Metabolic Panel: Recent Labs  Lab 06/01/21 2102 06/01/21 2110 06/02/21 0211  NA 130* 133* 132*  K 4.4 4.5 4.1  CL 101 102 100  CO2 22  --  23  GLUCOSE 186* 184* 90  BUN 31* 26* 29*  CREATININE 1.33* 1.40* 1.26*  CALCIUM 9.4  --  9.4  MG  --   --  1.6*   GFR: Estimated Creatinine Clearance: 58.7 mL/min (A) (by C-G formula based on SCr of 1.26 mg/dL (H)). Liver Function Tests: Recent Labs  Lab 06/01/21 2102 06/02/21 0211  AST 22 17  ALT 17 15  ALKPHOS 69 64  BILITOT 0.5 0.7  PROT 6.9 6.6  ALBUMIN 4.1 3.8   No results for input(s): LIPASE, AMYLASE in the last 168 hours. No results for input(s): AMMONIA in the last 168 hours. Coagulation Profile: Recent Labs  Lab 06/01/21 2102  INR 1.1   Cardiac Enzymes: No results for input(s): CKTOTAL, CKMB, CKMBINDEX, TROPONINI in the last 168 hours. BNP (last 3 results) No results for input(s): PROBNP in the last 8760 hours. HbA1C: Recent Labs    06/02/21 0211  HGBA1C 6.1*   CBG: Recent Labs  Lab 06/01/21 2136  GLUCAP 146*   Lipid Profile: Recent Labs    06/02/21 0211  CHOL 218*  HDL 29*  LDLCALC 132*  TRIG 287*  CHOLHDL 7.5   Thyroid Function Tests: Recent Labs    06/01/21 2102  TSH 2.587   Anemia Panel: No results for input(s): VITAMINB12, FOLATE, FERRITIN, TIBC, IRON, RETICCTPCT in the last 72 hours. Sepsis Labs: No results for input(s): PROCALCITON, LATICACIDVEN in the last 168 hours.  Recent Results (from the past 240 hour(s))  Resp Panel by RT-PCR (Flu A&B, Covid) Nasopharyngeal Swab     Status: None   Collection Time: 06/01/21  9:40 PM   Specimen: Nasopharyngeal Swab; Nasopharyngeal(NP) swabs in vial transport medium  Result Value Ref Range Status   SARS Coronavirus 2 by RT PCR NEGATIVE NEGATIVE Final    Comment: (NOTE) SARS-CoV-2 target nucleic acids are NOT DETECTED.  The SARS-CoV-2 RNA  is generally detectable in upper respiratory specimens during the acute phase of infection. The lowest concentration of SARS-CoV-2 viral copies this assay can detect is 138 copies/mL. A negative result does not preclude SARS-Cov-2 infection and should not be used as the sole basis for treatment or other patient management decisions. A negative result may occur with  improper specimen collection/handling, submission of specimen other than nasopharyngeal swab, presence of viral mutation(s) within the areas targeted by this assay, and inadequate number of viral copies(<138 copies/mL). A negative result must be combined with clinical observations, patient history, and epidemiological information. The expected result is Negative.  Fact Sheet for Patients:  EntrepreneurPulse.com.au  Fact Sheet for Healthcare Providers:  IncredibleEmployment.be  This test is no t yet approved or cleared by the [place] FDA and  has been authorized for detection and/or diagnosis of SARS-CoV-2 by FDA under an Emergency Use Authorization (EUA). This EUA will remain  in effect (meaning this test can be used) for the  duration of the COVID-19 declaration under Section 564(b)(1) of the Act, 21 U.S.C.section 360bbb-3(b)(1), unless the authorization is terminated  or revoked sooner.       Influenza A by PCR NEGATIVE NEGATIVE Final   Influenza B by PCR NEGATIVE NEGATIVE Final    Comment: (NOTE) The Xpert Xpress SARS-CoV-2/FLU/RSV plus assay is intended as an aid in the diagnosis of influenza from Nasopharyngeal swab specimens and should not be used as a sole basis for treatment. Nasal washings and aspirates are unacceptable for Xpert Xpress SARS-CoV-2/FLU/RSV testing.  Fact Sheet for Patients: BloggerCourse.com  Fact Sheet for Healthcare Providers: SeriousBroker.it  This test is not yet approved or cleared by the  [place] FDA and has been authorized for detection and/or diagnosis of SARS-CoV-2 by FDA under an Emergency Use Authorization (EUA). This EUA will remain in effect (meaning this test can be used) for the duration of the COVID-19 declaration under Section 564(b)(1) of the Act, 21 U.S.C. section 360bbb-3(b)(1), unless the authorization is terminated or revoked.  Performed at [facility], [address], [place], [place] [address]       Radiology Studies: MR BRAIN WO CONTRAST  Result Date: 06/02/2021 CLINICAL DATA:  Acute neurologic deficit EXAM: MRI HEAD WITHOUT CONTRAST TECHNIQUE: Multiplanar, multiecho pulse sequences of the brain and surrounding structures were obtained without intravenous contrast. COMPARISON:  Head CT 06/01/2021 FINDINGS: Brain: Small area of abnormal diffusion restriction in the right centrum semiovale. No acute or chronic hemorrhage. There is multifocal hyperintense T2-weighted signal within the white matter. Generalized volume loss without a clear lobar predilection. The midline structures are normal. Vascular: Major flow voids are preserved. Skull and upper cervical spine: Normal calvarium and skull base. Visualized upper cervical spine and soft tissues are normal. Sinuses/Orbits:No paranasal sinus fluid levels or advanced mucosal thickening. No mastoid or middle ear effusion. Normal orbits. IMPRESSION: 1. Small area of acute/subacute ischemia in the right centrum semiovale. No hemorrhage or mass effect. 2. Findings of chronic microvascular ischemia and generalized volume loss. Electronically Signed   By: Deatra Robinson M.D.   On: 06/02/2021 00:52   CT HEAD CODE STROKE WO CONTRAST  Result Date: 06/01/2021 CLINICAL DATA:  Code stroke. Neuro deficit, acute, stroke suspected. EXAM: CT HEAD WITHOUT CONTRAST TECHNIQUE: Contiguous axial images were obtained from the base of the skull through the vertex without intravenous contrast. RADIATION DOSE  REDUCTION: This exam was performed according to the departmental dose-optimization program which includes automated exposure control, adjustment of the mA and/or kV according to patient size and/or use of iterative reconstruction technique. COMPARISON:  None. FINDINGS: Brain: No abnormality seen affecting the brainstem or cerebellum. Cerebral hemispheres show age related atrophy with old small vessel infarctions of the thalami, basal ganglia and hemispheric white matter. No sign of acute infarction, mass lesion, hemorrhage, hydrocephalus or extra-axial collection. Vascular: There is atherosclerotic calcification of the major vessels at the base of the brain. Skull: Negative Sinuses/Orbits: Clear/normal Other: None ASPECTS (Alberta Stroke Program Early CT Score) - Ganglionic level infarction (caudate, lentiform nuclei, internal capsule, insula, M1-M3 cortex): 7 - Supraganglionic infarction (M4-M6 cortex): 3 Total score (0-10 with 10 being normal): 10 IMPRESSION: 1. No acute CT finding. 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 2. ASPECTS is 10. 3. These results were called by telephone at the time of interpretation on 06/01/2021 at 9:20 pm to provider Dr. Rubin Payor, who verbally acknowledged these results. Electronically Signed   By: Scherrie Bateman.D.  On: 06/01/2021 21:26   CT ANGIO HEAD NECK W WO CM W PERF (CODE STROKE)  Result Date: 06/01/2021 CLINICAL DATA:  CODE STROKE EXAM: CT ANGIOGRAPHY HEAD AND NECK CT PERFUSION BRAIN TECHNIQUE: Multidetector CT imaging of the head and neck was performed using the standard protocol during bolus administration of intravenous contrast. Multiplanar CT image reconstructions and MIPs were obtained to evaluate the vascular anatomy. Carotid stenosis measurements (when applicable) are obtained utilizing NASCET criteria, using the distal internal carotid diameter as the denominator. Multiphase CT imaging of the brain was  performed following IV bolus contrast injection. Subsequent parametric perfusion maps were calculated using RAPID software. RADIATION DOSE REDUCTION: This exam was performed according to the departmental dose-optimization program which includes automated exposure control, adjustment of the mA and/or kV according to patient size and/or use of iterative reconstruction technique. CONTRAST:  160mL OMNIPAQUE IOHEXOL 350 MG/ML SOLN, 66mL OMNIPAQUE IOHEXOL 350 MG/ML SOLN COMPARISON:  None. FINDINGS: CTA NECK FINDINGS SKELETON: There is no bony spinal canal stenosis. No lytic or blastic lesion. OTHER NECK: Normal pharynx, larynx and major salivary glands. No cervical lymphadenopathy. Unremarkable thyroid gland. UPPER CHEST: No pneumothorax or pleural effusion. No nodules or masses. AORTIC ARCH: There is calcific atherosclerosis of the aortic arch. There is decreased enhancement of the visualized portion of the descending aorta. The visualized proximal subclavian arteries are widely patent. There are numerous prominent collaterals in the posterior mediastinum and paraspinous musculature. RIGHT CAROTID SYSTEM: No dissection, occlusion or aneurysm. There is mixed density atherosclerosis extending into the proximal ICA, resulting in less than 50% stenosis. LEFT CAROTID SYSTEM: Normal without aneurysm, dissection or stenosis. VERTEBRAL ARTERIES: Right dominant configuration. Both origins are clearly patent. There is no dissection, occlusion or flow-limiting stenosis to the skull base (V1-V3 segments). CTA HEAD FINDINGS POSTERIOR CIRCULATION: --Vertebral arteries: Normal V4 segments. --Inferior cerebellar arteries: Normal. --Basilar artery: Normal. --Superior cerebellar arteries: Normal. --Posterior cerebral arteries (PCA): Normal. ANTERIOR CIRCULATION: --Intracranial internal carotid arteries: Normal. --Anterior cerebral arteries (ACA): Normal. Both A1 segments are present. Patent anterior communicating artery (a-comm). --Middle  cerebral arteries (MCA): Normal. VENOUS SINUSES: As permitted by contrast timing, patent. ANATOMIC VARIANTS: None Review of the MIP images confirms the above findings. CT Brain Perfusion Findings: Perfusion data is unreliable due to motion artifacts and abnormal bolus characteristics. IMPRESSION: 1. No intracranial arterial occlusion or high-grade stenosis. 2. Unreliable perfusion data due to motion artifacts and abnormal bolus characteristics. 3. 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 Further characterization with CTA of the chest is recommended. 4. Mixed density right carotid bifurcation plaque with less than 50% stenosis. Aortic Atherosclerosis (ICD10-I70.0). Electronically Signed   By: Ulyses Jarred M.D.   On: 06/01/2021 22:20    Scheduled Meds:   stroke: mapping our early stages of recovery book   Does not apply Once   aspirin  300 mg Rectal Daily   Or   aspirin  325 mg Oral Daily   doxazosin  2 mg Oral QPM   heparin  5,000 Units Subcutaneous Q8H   Continuous Infusions:   LOS: 0 days   Time spent: 35 minutes   Yarden Hillis Loann Quill, MD [facility]  If 7PM-7AM, please contact night-coverage [email or web address] 06/02/2021, 12:48 PM

## 2021-06-02 NOTE — ED Notes (Signed)
Pt aox4, able to follow commands. Left side extremities weaker than right. Cont with aphasia, still able to understand speech. See NIH. Pt requesting to eat/drink. Stroke swallow screen at bedside failed during previous shift. Pending SLP. Explained to pt.

## 2021-06-02 NOTE — Progress Notes (Signed)
Inpatient Rehab Admissions Coordinator Note:   Per OT recommendations patient was screened for CIR candidacy by Stephania Fragmin, PT. At this time, pt appears to be a potential candidate for CIR. I will place an order for rehab consult for full assessment, per our protocol.  Please contact me any with questions.Estill Dooms, PT, DPT [phone] 06/02/21 12:34 PM

## 2021-06-02 NOTE — Evaluation (Signed)
Clinical/Bedside Swallow Evaluation Patient Details  Name: Allen Torres MRN: [id number] Date of Birth: [birth year]  Today's Date: 06/02/2021 Time: SLP Start Time (ACUTE ONLY): 1135 SLP Stop Time (ACUTE ONLY): 1201 SLP Time Calculation (min) (ACUTE ONLY): 26 min  Past Medical History:  Past Medical History:  Diagnosis Date   Hypertension    Renal disorder    Patient states, "chronic kidney disease"   Past Surgical History:  Past Surgical History:  Procedure Laterality Date   BIOPSY  03/16/2020   Procedure: BIOPSY;  Surgeon: Benancio Deeds, MD;  Location: [facility];  Service: Gastroenterology;;   CHOLECYSTECTOMY N/A 03/18/2020   Procedure: LAPAROSCOPIC CHOLECYSTECTOMY;  Surgeon: Luretha Murphy, MD;  Location: [facility];  Service: General;  Laterality: N/A;   ESOPHAGOGASTRODUODENOSCOPY N/A 03/16/2020   Procedure: ESOPHAGOGASTRODUODENOSCOPY (EGD);  Surgeon: Benancio Deeds, MD;  Location: [facility];  Service: Gastroenterology;  Laterality: N/A;   facial cystectomy     HPI:  Pt is an 81 yo male presenting wtih L facial droop and slurred speech. MRI showed small area of acute/subacute ischemia in the R centrum semiovale. PMH includes: HTN, renal disorder    Assessment / Plan / Recommendation  Clinical Impression  Pt has L sided facial weakness (CN VII) with no lingual deviation but reduced L lingual strength during testing. Despite the impact this has on his speech, he does not have any significant pocketing, oral residue, or overt s/s of aspiration even when challenged with larger volumes of thin liquids. At baseline he does not wear his dentures for eating and tends to gravitate toward softer foods. Will start with Dys 2 (finely chopped), which may be a little softer than his norm, given weakness noted today. Will also begin wtih thin liquids. SLP will f/u for ongoing assessment of swallowing as well as completion of speech/language evaluation. SLP Visit Diagnosis: Dysphagia,  unspecified (R13.10)    Aspiration Risk  Mild aspiration risk    Diet Recommendation Dysphagia 2 (Fine chop);Thin liquid   Liquid Administration via: Cup;Straw Medication Administration: Whole meds with puree Supervision: Patient able to self feed;Intermittent supervision to cue for compensatory strategies (closer supervision during first meal(s)) Compensations: Slow rate;Small sips/bites Postural Changes: Seated upright at 90 degrees    Other  Recommendations Oral Care Recommendations: Oral care BID    Recommendations for follow up therapy are one component of a multi-disciplinary discharge planning process, led by the attending physician.  Recommendations may be updated based on patient status, additional functional criteria and insurance authorization.  Follow up Recommendations Acute inpatient rehab (3hours/day)      Assistance Recommended at Discharge Intermittent Supervision/Assistance  Functional Status Assessment Patient has had a recent decline in their functional status and demonstrates the ability to make significant improvements in function in a reasonable and predictable amount of time.  Frequency and Duration min 2x/week          Prognosis Prognosis for Safe Diet Advancement: Good      Swallow Study   General HPI: Pt is an 81 yo male presenting wtih L facial droop and slurred speech. MRI showed small area of acute/subacute ischemia in the R centrum semiovale. PMH includes: HTN, renal disorder Type of Study: Bedside Swallow Evaluation Previous Swallow Assessment: none in chart Diet Prior to this Study: NPO Temperature Spikes Noted: No Respiratory Status: Room air History of Recent Intubation: No Behavior/Cognition: Alert;Cooperative;Other (Comment) (emotional) Oral Cavity Assessment: Within Functional Limits Oral Care Completed by SLP: No Oral Cavity - Dentition: Edentulous Vision: Functional for  self-feeding Self-Feeding Abilities: Able to feed self Patient  Positioning: Upright in bed Baseline Vocal Quality: Normal Volitional Cough: Strong Volitional Swallow: Able to elicit    Oral/Motor/Sensory Function Overall Oral Motor/Sensory Function: Moderate impairment (mild-mod) Facial ROM: Reduced left;Suspected CN VII (facial) dysfunction Facial Symmetry: Abnormal symmetry left;Suspected CN VII (facial) dysfunction Facial Strength: Reduced left;Suspected CN VII (facial) dysfunction Facial Sensation: Within Functional Limits Lingual ROM: Within Functional Limits Lingual Symmetry: Within Functional Limits Lingual Strength: Reduced;Suspected CN XII (hypoglossal) dysfunction Velum: Within Functional Limits Mandible: Within Functional Limits   Ice Chips Ice chips: Within functional limits Presentation: Spoon   Thin Liquid Thin Liquid: Within functional limits Presentation: Cup;Self Fed;Straw    Nectar Thick Nectar Thick Liquid: Not tested   Honey Thick Honey Thick Liquid: Not tested   Puree Puree: Within functional limits Presentation: Self Fed;Spoon   Solid     Solid: Impaired Presentation: Self Fed Oral Phase Impairments: Impaired mastication      Mahala Menghini., M.A. CCC-SLP Acute Rehabilitation Services Pager [phone] Office [phone]  06/02/2021,1:47 PM

## 2021-06-02 NOTE — Progress Notes (Signed)
Stood at bedside to use the urinal; fall safety reviewed and patient is cooperative.

## 2021-06-02 NOTE — Progress Notes (Signed)
Received patient from [facility] via [facility] transport; oriented to room and unit routine; patient is frustrated he is here; support provided and stroke education; family in route to visit.

## 2021-06-02 NOTE — Progress Notes (Signed)
Code Stroke Activated @ 2100 EDP not at bedside upon cart activation Pt left for CT @ 2107 Pt Returned @ 2133 Neuro Telespecialist paged @ 2133 Neuro [facility]D notified of CTA/Perf results via secure messaging @ 2230

## 2021-06-02 NOTE — Evaluation (Signed)
Physical Therapy Evaluation Patient Details Name: Allen Torres MRN: [id number] DOB: [birth year] Today's Date: 06/02/2021  History of Present Illness  Patient is an 81 year old male presenting to ED with left facial droop and speech. MRI reading Small area of acute/subacute ischemia in the right centrum  semiovale. PMH: HTN  Clinical Impression  The patient  presents with decreased motor control of the the L extremities. Noted left lateral lean when standing, decreased L Hand grip on RW, with  decreased LUE awareness.  The patient reports independent PTA , lives with daughter., still drives.  Patient will benefit from AIR.  Pt admitted with above diagnosis.  Pt currently with functional limitations due to the deficits listed below (see PT Problem List). Pt will benefit from skilled PT to increase their independence and safety with mobility to allow discharge to the venue listed below.        Recommendations for follow up therapy are one component of a multi-disciplinary discharge planning process, led by the attending physician.  Recommendations may be updated based on patient status, additional functional criteria and insurance authorization.  Follow Up Recommendations Acute inpatient rehab (3hours/day)    Assistance Recommended at Discharge Frequent or constant Supervision/Assistance  Patient can return home with the following  A lot of help with walking and/or transfers;A lot of help with bathing/dressing/bathroom;Assistance with cooking/housework;Assist for transportation;Help with stairs or ramp for entrance;Assistance with feeding    Equipment Recommendations Rolling walker (2 wheels)  Recommendations for Other Services       Functional Status Assessment Patient has had a recent decline in their functional status and demonstrates the ability to make significant improvements in function in a reasonable and predictable amount of time.     Precautions / Restrictions  Precautions Precautions: Fall Precaution Comments: left side weakness Restrictions Weight Bearing Restrictions: No      Mobility  Bed Mobility               General bed mobility comments: Cue patient to sit up at edge of bed. Appearing to have difficulty motor planning trying to sit up with legs on the bed. When cued to bring legs off edge having difficulty sequencing needing max A.    Transfers Overall transfer level: Needs assistance Equipment used: Rolling walker (2 wheels) Transfers: Sit to/from Stand Sit to Stand: Min assist           General transfer comment: noted decreased L hand grip on Rw, slipping off. Decreased LLE position/control  when stood up    Ambulation/Gait Ambulation/Gait assistance: Mod assist Gait Distance (Feet): 20 Feet Assistive device: Rolling walker (2 wheels) Gait Pattern/deviations: Step-to pattern, Step-through pattern, Staggering left, Drifts right/left Gait velocity: decr     General Gait Details: noted decreased swing/ control when stepping with Left leg, Left knee flexed in stance. Patient listing to the left when ambulating, required support of therapist  Stairs            Wheelchair Mobility    Modified Rankin (Stroke Patients Only)       Balance Overall balance assessment: Needs assistance Sitting-balance support: Feet supported Sitting balance-Leahy Scale: Poor Sitting balance - Comments: needing up to min A at times due to posterior loss of balance   Standing balance support: Bilateral upper extremity supported, During functional activity, Reliant on assistive device for balance Standing balance-Leahy Scale: Poor Standing balance comment: lateral lean to left  Pertinent Vitals/Pain Pain Assessment Faces Pain Scale: No hurt Pain Intervention(s): Monitored during session    Home Living Family/patient expects to be discharged to:: Private residence Living  Arrangements: Children (daughter) Available Help at Discharge: Family Type of Home: House Home Access: Stairs to enter;Ramped entrance       Home Layout: Able to live on main level with bedroom/bathroom Home Equipment: None      Prior Function Prior Level of Function : Independent/Modified Independent                     Hand Dominance   Dominant Hand: Right    Extremity/Trunk Assessment   Upper Extremity Assessment Upper Extremity Assessment: Defer to OT evaluation RUE Deficits / Details: AROM grossly intact biceps 4-/5 triceps 4/5 grip 4+/5 RUE Coordination: WNL LUE Deficits / Details: AROM grossly intact, biceps 3+/5, triceps 4-/5, grip 4/5 LUE Coordination: decreased fine motor    Lower Extremity Assessment Lower Extremity Assessment: LLE deficits/detail LLE Deficits / Details: noted knee flexed with stance, decreased fine motor, upgoing toes with stimulation    Cervical / Trunk Assessment Cervical / Trunk Assessment: Normal  Communication   Communication: Expressive difficulties  Cognition Arousal/Alertness: Awake/alert Behavior During Therapy: WFL for tasks assessed/performed Overall Cognitive Status: Impaired/Different from baseline Area of Impairment: Problem solving                             Problem Solving: Difficulty sequencing General Comments: Patient having difficulty with motor planning getting out of bed. Also unable to problem solve navigating walker around bed running into frame possibly due to some left inattention        General Comments      Exercises     Assessment/Plan    PT Assessment Patient needs continued PT services  PT Problem List Decreased strength;Decreased mobility;Decreased safety awareness;Decreased coordination;Decreased knowledge of precautions;Decreased activity tolerance;Decreased balance;Decreased knowledge of use of DME;Cardiopulmonary status limiting activity       PT Treatment Interventions  DME instruction;Therapeutic activities;Gait training;Therapeutic exercise;Patient/family education;Balance training;Functional mobility training;Neuromuscular re-education    PT Goals (Current goals can be found in the Care Plan section)  Acute Rehab PT Goals Patient Stated Goal: to go home soon PT Goal Formulation: With patient Time For Goal Achievement: 06/16/21 Potential to Achieve Goals: Good    Frequency Min 4X/week     Co-evaluation PT/OT/SLP Co-Evaluation/Treatment: Yes Reason for Co-Treatment: For patient/therapist safety PT goals addressed during session: Mobility/safety with mobility OT goals addressed during session: ADL's and self-care       AM-PAC PT "6 Clicks" Mobility  Outcome Measure Help needed turning from your back to your side while in a flat bed without using bedrails?: A Lot Help needed moving from lying on your back to sitting on the side of a flat bed without using bedrails?: A Lot Help needed moving to and from a bed to a chair (including a wheelchair)?: A Lot Help needed standing up from a chair using your arms (e.g., wheelchair or bedside chair)?: A Lot Help needed to walk in hospital room?: A Lot Help needed climbing 3-5 steps with a railing? : Total 6 Click Score: 11    End of Session Equipment Utilized During Treatment: Gait belt Activity Tolerance: Patient tolerated treatment well Patient left: in bed;with call bell/phone within reach Nurse Communication: Mobility status PT Visit Diagnosis: Unsteadiness on feet (R26.81);Muscle weakness (generalized) (M62.81);Difficulty in walking, not elsewhere classified (R26.2);Other symptoms and signs  involving the nervous system (R29.898)    Time: 4010-2725 PT Time Calculation (min) (ACUTE ONLY): 26 min   Charges:   PT Evaluation $PT Eval Low Complexity: 1 Low          Blanchard Kelch PT Acute Rehabilitation Services Pager [phone] Office [phone]   Rada Hay 06/02/2021, 3:24  PM

## 2021-06-03 ENCOUNTER — Inpatient Hospital Stay (HOSPITAL_COMMUNITY): Payer: Medicare HMO

## 2021-06-03 DIAGNOSIS — E782 Mixed hyperlipidemia: Secondary | ICD-10-CM

## 2021-06-03 DIAGNOSIS — I129 Hypertensive chronic kidney disease with stage 1 through stage 4 chronic kidney disease, or unspecified chronic kidney disease: Secondary | ICD-10-CM | POA: Diagnosis not present

## 2021-06-03 DIAGNOSIS — I6389 Other cerebral infarction: Secondary | ICD-10-CM | POA: Diagnosis not present

## 2021-06-03 DIAGNOSIS — I639 Cerebral infarction, unspecified: Secondary | ICD-10-CM | POA: Diagnosis not present

## 2021-06-03 DIAGNOSIS — N183 Chronic kidney disease, stage 3 unspecified: Secondary | ICD-10-CM | POA: Diagnosis not present

## 2021-06-03 LAB — ECHOCARDIOGRAM COMPLETE
AR max vel: 1.02 cm2
AV Area VTI: 1.08 cm2
AV Area mean vel: 1.02 cm2
AV Mean grad: 12 mmHg
AV Peak grad: 21.3 mmHg
Ao pk vel: 2.31 m/s
Area-P 1/2: 3.34 cm2
Calc EF: 42.5 %
Height: 71 in
S' Lateral: 2.9 cm
Single Plane A2C EF: 38.6 %
Single Plane A4C EF: 41.4 %
Weight: 3840 oz

## 2021-06-03 LAB — BASIC METABOLIC PANEL
Anion gap: 10 (ref 5–15)
BUN: 24 mg/dL — ABNORMAL HIGH (ref 8–23)
CO2: 24 mmol/L (ref 22–32)
Calcium: 9.4 mg/dL (ref 8.9–10.3)
Chloride: 102 mmol/L (ref 98–111)
Creatinine, Ser: 1.59 mg/dL — ABNORMAL HIGH (ref 0.61–1.24)
GFR, Estimated: 44 mL/min — ABNORMAL LOW (ref >60)
Glucose, Bld: 88 mg/dL (ref 70–99)
Potassium: 4.4 mmol/L (ref 3.5–5.1)
Sodium: 136 mmol/L (ref 135–145)

## 2021-06-03 LAB — MAGNESIUM: Magnesium: 1.9 mg/dL (ref 1.7–2.4)

## 2021-06-03 MED ORDER — LACTATED RINGERS IV SOLN: INTRAVENOUS | Status: DC

## 2021-06-03 MED ORDER — ASPIRIN EC 81 MG PO TBEC
81.0000 mg | DELAYED_RELEASE_TABLET | Freq: Every day | ORAL | Status: DC
  Administered 2021-06-03 – 2021-06-06 (×4): 81 mg via ORAL
  Filled 2021-06-03 (×4): qty 1

## 2021-06-03 MED ORDER — EZETIMIBE 10 MG PO TABS: 10.0000 mg | ORAL_TABLET | Freq: Every day | ORAL | Status: DC

## 2021-06-03 MED ORDER — CLOPIDOGREL BISULFATE 75 MG PO TABS
75.0000 mg | ORAL_TABLET | Freq: Every day | ORAL | Status: DC
  Administered 2021-06-03 – 2021-06-06 (×4): 75 mg via ORAL
  Filled 2021-06-03 (×4): qty 1

## 2021-06-03 MED ORDER — ATORVASTATIN CALCIUM 40 MG PO TABS
40.0000 mg | ORAL_TABLET | Freq: Every day | ORAL | Status: DC
  Filled 2021-06-03: qty 1

## 2021-06-03 NOTE — Progress Notes (Signed)
STROKE TEAM PROGRESS NOTE   SUBJECTIVE (INTERVAL HISTORY) His son and granddaughter are at the bedside.  Overall his condition is gradually improving.  Patient lying in bed, still has moderate dysarthria, left facial droop and left arm paresis.  PT/OT recommend CIR.  Patient still refused collateral medications including statin and Zetia.   OBJECTIVE Temp:  [97.7 F (36.5 C)-98.8 F (37.1 C)] 98.4 F (36.9 C) (02/18 0738) Pulse Rate:  [62-90] 69 (02/18 0738) Cardiac Rhythm: Heart block;Bundle branch block (02/18 0742) Resp:  [16-20] 18 (02/18 0414) BP: (107-156)/(47-83) 122/52 (02/18 0738) SpO2:  [95 %-98 %] 95 % (02/18 0738)  Recent Labs  Lab 06/01/21 2136  GLUCAP 146*   Recent Labs  Lab 06/01/21 2102 06/01/21 2110 06/02/21 0211 06/03/21 0446  NA 130* 133* 132* 136  K 4.4 4.5 4.1 4.4  CL 101 102 100 102  CO2 22  --  23 24  GLUCOSE 186* 184* 90 88  BUN 31* 26* 29* 24*  CREATININE 1.33* 1.40* 1.26* 1.59*  CALCIUM 9.4  --  9.4 9.4  MG  --   --  1.6* 1.9   Recent Labs  Lab 06/01/21 2102 06/02/21 0211  AST 22 17  ALT 17 15  ALKPHOS 69 64  BILITOT 0.5 0.7  PROT 6.9 6.6  ALBUMIN 4.1 3.8   Recent Labs  Lab 06/01/21 2102 06/01/21 2110 06/02/21 0211  WBC 6.1  --  6.5  NEUTROABS 3.3  --  3.3  HGB 12.3* 12.6* 11.6*  HCT 36.1* 37.0* 33.3*  MCV 98.1  --  96.2  PLT 198  --  179   No results for input(s): CKTOTAL, CKMB, CKMBINDEX, TROPONINI in the last 168 hours. Recent Labs    06/01/21 2102  LABPROT 14.1  INR 1.1   Recent Labs    06/01/21 2145  COLORURINE YELLOW  LABSPEC 1.011  PHURINE 5.0  GLUCOSEU NEGATIVE  HGBUR NEGATIVE  BILIRUBINUR NEGATIVE  KETONESUR NEGATIVE  PROTEINUR NEGATIVE  NITRITE NEGATIVE  LEUKOCYTESUR NEGATIVE       Component Value Date/Time   CHOL 218 (H) 06/02/2021 0211   TRIG 287 (H) 06/02/2021 0211   HDL 29 (L) 06/02/2021 0211   CHOLHDL 7.5 06/02/2021 0211   VLDL 57 (H) 06/02/2021 0211   LDLCALC 132 (H) 06/02/2021 0211    Lab Results  Component Value Date   HGBA1C 6.1 (H) 06/02/2021      Component Value Date/Time   LABOPIA NONE DETECTED 06/01/2021 2145   COCAINSCRNUR NONE DETECTED 06/01/2021 2145   LABBENZ NONE DETECTED 06/01/2021 2145   AMPHETMU NONE DETECTED 06/01/2021 2145   THCU NONE DETECTED 06/01/2021 2145   LABBARB NONE DETECTED 06/01/2021 2145    Recent Labs  Lab 06/01/21 2102  ETH <10    I have personally reviewed the radiological images below and agree with the radiology interpretations.  MR BRAIN WO CONTRAST  Result Date: 06/02/2021 CLINICAL DATA:  Acute neurologic deficit EXAM: MRI HEAD WITHOUT CONTRAST TECHNIQUE: Multiplanar, multiecho pulse sequences of the brain and surrounding structures were obtained without intravenous contrast. COMPARISON:  Head CT 06/01/2021 FINDINGS: Brain: Small area of abnormal diffusion restriction in the right centrum semiovale. No acute or chronic hemorrhage. There is multifocal hyperintense T2-weighted signal within the white matter. Generalized volume loss without a clear lobar predilection. The midline structures are normal. Vascular: Major flow voids are preserved. Skull and upper cervical spine: Normal calvarium and skull base. Visualized upper cervical spine and soft tissues are normal. Sinuses/Orbits:No paranasal sinus fluid levels  or advanced mucosal thickening. No mastoid or middle ear effusion. Normal orbits. IMPRESSION: 1. Small area of acute/subacute ischemia in the right centrum semiovale. No hemorrhage or mass effect. 2. Findings of chronic microvascular ischemia and generalized volume loss. Electronically Signed   By: Ulyses Jarred M.D.   On: 06/02/2021 00:52   CT HEAD CODE STROKE WO CONTRAST  Result Date: 06/01/2021 CLINICAL DATA:  Code stroke. Neuro deficit, acute, stroke suspected. EXAM: CT HEAD WITHOUT CONTRAST TECHNIQUE: Contiguous axial images were obtained from the base of the skull through the vertex without intravenous contrast.  RADIATION DOSE REDUCTION: This exam was performed according to the departmental dose-optimization program which includes automated exposure control, adjustment of the mA and/or kV according to patient size and/or use of iterative reconstruction technique. COMPARISON:  None. FINDINGS: Brain: No abnormality seen affecting the brainstem or cerebellum. Cerebral hemispheres show age related atrophy with old small vessel infarctions of the thalami, basal ganglia and hemispheric white matter. No sign of acute infarction, mass lesion, hemorrhage, hydrocephalus or extra-axial collection. Vascular: There is atherosclerotic calcification of the major vessels at the base of the brain. Skull: Negative Sinuses/Orbits: Clear/normal Other: None ASPECTS ([place]unt Stroke Program Early CT Score) - Ganglionic level infarction (caudate, lentiform nuclei, internal capsule, insula, M1-M3 cortex): 7 - Supraganglionic infarction (M4-M6 cortex): 3 Total score (0-10 with 10 being normal): 10 IMPRESSION: 1. No acute CT finding. 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 2. ASPECTS is 10. 3. These results were called by telephone at the time of interpretation on 06/01/2021 at 9:20 pm to provider Dr. Alvino Chapel, who verbally acknowledged these results. Electronically Signed   By: Nelson Chimes M.D.   On: 06/01/2021 21:26   CT ANGIO HEAD NECK W WO CM W PERF (CODE STROKE)  Result Date: 06/01/2021 CLINICAL DATA:  CODE STROKE EXAM: CT ANGIOGRAPHY HEAD AND NECK CT PERFUSION BRAIN TECHNIQUE: Multidetector CT imaging of the head and neck was performed using the standard protocol during bolus administration of intravenous contrast. Multiplanar CT image reconstructions and MIPs were obtained to evaluate the vascular anatomy. Carotid stenosis measurements (when applicable) are obtained utilizing NASCET criteria, using the distal internal carotid diameter as the denominator. Multiphase CT imaging of the  brain was performed following IV bolus contrast injection. Subsequent parametric perfusion maps were calculated using RAPID software. RADIATION DOSE REDUCTION: This exam was performed according to the departmental dose-optimization program which includes automated exposure control, adjustment of the mA and/or kV according to patient size and/or use of iterative reconstruction technique. CONTRAST:  146mL OMNIPAQUE IOHEXOL 350 MG/ML SOLN, 80mL OMNIPAQUE IOHEXOL 350 MG/ML SOLN COMPARISON:  None. FINDINGS: CTA NECK FINDINGS SKELETON: There is no bony spinal canal stenosis. No lytic or blastic lesion. OTHER NECK: Normal pharynx, larynx and major salivary glands. No cervical lymphadenopathy. Unremarkable thyroid gland. UPPER CHEST: No pneumothorax or pleural effusion. No nodules or masses. AORTIC ARCH: There is calcific atherosclerosis of the aortic arch. There is decreased enhancement of the visualized portion of the descending aorta. The visualized proximal subclavian arteries are widely patent. There are numerous prominent collaterals in the posterior mediastinum and paraspinous musculature. RIGHT CAROTID SYSTEM: No dissection, occlusion or aneurysm. There is mixed density atherosclerosis extending into the proximal ICA, resulting in less than 50% stenosis. LEFT CAROTID SYSTEM: Normal without aneurysm, dissection or stenosis. VERTEBRAL ARTERIES: Right dominant configuration. Both origins are clearly patent. There is no dissection, occlusion or flow-limiting stenosis to the skull base (V1-V3 segments). CTA HEAD FINDINGS  POSTERIOR CIRCULATION: --Vertebral arteries: Normal V4 segments. --Inferior cerebellar arteries: Normal. --Basilar artery: Normal. --Superior cerebellar arteries: Normal. --Posterior cerebral arteries (PCA): Normal. ANTERIOR CIRCULATION: --Intracranial internal carotid arteries: Normal. --Anterior cerebral arteries (ACA): Normal. Both A1 segments are present. Patent anterior communicating artery  (a-comm). --Middle cerebral arteries (MCA): Normal. VENOUS SINUSES: As permitted by contrast timing, patent. ANATOMIC VARIANTS: None Review of the MIP images confirms the above findings. CT Brain Perfusion Findings: Perfusion data is unreliable due to motion artifacts and abnormal bolus characteristics. IMPRESSION: 1. No intracranial arterial occlusion or high-grade stenosis. 2. Unreliable perfusion data due to motion artifacts and abnormal bolus characteristics. 3. 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 Further characterization with CTA of the chest is recommended. 4. Mixed density right carotid bifurcation plaque with less than 50% stenosis. Aortic Atherosclerosis (ICD10-I70.0). Electronically Signed   By: Ulyses Jarred M.D.   On: 06/01/2021 22:20     PHYSICAL EXAM  Temp:  [97.7 F (36.5 C)-98.8 F (37.1 C)] 98.4 F (36.9 C) (02/18 0738) Pulse Rate:  [62-90] 69 (02/18 0738) Resp:  [16-20] 18 (02/18 0414) BP: (107-156)/(47-83) 122/52 (02/18 0738) SpO2:  [95 %-98 %] 95 % (02/18 0738)  General - Well nourished, well developed, in no apparent distress.  Ophthalmologic - fundi not visualized due to noncooperation.  Cardiovascular - Regular rhythm and rate.  Neuro - awake, alert, eyes open, orientated to age, place, and people, but not to time. No aphasia, fluent language however moderate to severe dysarthria, following all simple commands. Able to name and repeat. No gaze palsy, tracking bilaterally, visual field full.  Left facial droop. Tongue midline.  Right upper and lower extremity 5/5, left upper extremity 4/5 proximal and distal, left lower extremity proximal 5/5 and distal 4/5. Sensation symmetrical bilaterally, b/l FTN intact but slow on the left, gait not tested.    ASSESSMENT/PLAN Allen Torres is a 81 y.o. male with history of hypertension and hyperlipidemia admitted for slurred speech, left facial droop and  left-sided weakness. No tPA given due to outside window.    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  Continue DAPT for 3 weeks and then aspirin alone. Patient counseled to be compliant with his antithrombotic medications Ongoing aggressive stroke risk factor management Therapy recommendations: CIR Disposition: Pending  Hypertension Stable Long term BP goal normotensive  Hyperlipidemia Home meds: None LDL 132, goal < 70 Patient had side effect with statin in the past, now refusing any medications for hyperlipidemia including statin and Zetia  Other Stroke Risk Factors Advanced age Obesity, Body mass index is 33.47 kg/m.  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 Please call with questions. Pt will follow up with stroke clinic NP at [facility] in about 4 weeks. Thanks for the consult.   Rosalin Hawking, MD PhD Stroke Neurology 06/03/2021 1:15 PM    To contact Stroke Continuity provider, please refer to [email or web address] After hours, contact General Neurology

## 2021-06-03 NOTE — Progress Notes (Signed)
°  Transition of Care [facility]) Screening Note   Patient Details  Name: Allen Torres Date of Birth: 11-Feb-1941   Transition of Care [facility]) CM/SW Contact:    Geralynn Ochs, LCSW Phone Number: 06/03/2021, 8:52 AM    Transition of Care Department [facility]) has reviewed patient and no TOC needs have been identified at this time. We will continue to monitor patient advancement through interdisciplinary progression rounds. If new patient transition needs arise, please place a TOC consult.

## 2021-06-03 NOTE — PMR Pre-admission (Signed)
PMR Admission Coordinator Pre-Admission Assessment °  °Patient: Allen Torres is an 80 y.o., male °MRN: [id number] °DOB: 04/01/1941 °Height: 5' 11" (180.3 cm) °Weight: 108.9 kg °  °Insurance Information °HMO: yes    PPO:      PCP:      IPA:      80/20:      OTHER:  °PRIMARY: [employer]      Policy#: [id number]      Subscriber:  °CM Name: Patricia Espinoza      Phone#: [phone]     Fax#: [phone] °Pre-Cert#: [id number]      Employer: none I received approval from Dot Murphy from [employer] for admission 2/21. Approval good for 5 days, she requests weekly team conference notes be sent to Patricia Espinoza °Benefits:  Phone #:      Name:  °Eff Date: 04/16/2021- still active  °Deductible: does not have one  °OOP Max: $3,400 ($0 met)  °CIR: $295/day co-pay with a max co-pay of $1,770/admission (6 days)  °SNF: $0/day co-pay for days 1-20, $196/day co-pay for days 21-100, limited to 100 days/cal yr  °Outpatient:  $10-$20/visit co-pay  °Home Health:  100% coverage  °DME: 80% coverage; 20% co-insurance  °Providers: in network °  °SECONDARY: Generic Commercial      Policy#: [id number]     Phone#:  °  °Financial Counselor:       Phone#:  °  °The “Data Collection Information Summary” for patients in Inpatient Rehabilitation Facilities with attached “Privacy Act Statement-Health Care Records” was provided and verbally reviewed with: Patient °  °Emergency Contact Information °Contact Information   °  °  Name Relation Home Work Mobile  °  Moore,Jilda Daughter [phone]   [phone]  °  Zick,Tyrice Son     [phone]  °  °   °  °  °Current Medical History  °Patient Admitting Diagnosis: CVA °History of Present Illness: Patient is 80-year-old male with past medical history of hypertension, chronic kidney disease, hyperlipidemia presented with left-sided weakness and dysarthria. On arrival to the ER temp 98.7 heart rate 71 blood pressure 157/76. Labs show sodium 130 potassium 4.4 BUN of 31 creatinine 1.3. White  count 6.1, hemoglobin 12.3, platelets 198. Urine drug abuse negative. COVID and flu swabs are negative. EKG showed normal sinus rhythm.  First-degree AV block. CT head demonstrated old small vessel infarctions of bilateral thalamus, basal ganglia/deep white matter.CT angio head demonstrated no intracranial lesions or high-grade stenosis. MRI showed Small area of acute/subacute ischemia in the right centrum semiovale. No hemorrhage or mass effect. MRI also noted findings of chronic microvascular ischemia and generalized volume °loss.PT, OT, and SLP saw Pt. And recommended CIR to assist return to PLOF.  °  °Complete NIHSS TOTAL: 4 °  °Patient's medical record from [facility] has been reviewed by the rehabilitation admission coordinator and physician. °  °Past Medical History  °    °Past Medical History:  °Diagnosis Date  ° Hypertension    ° Renal disorder    °  Patient states, "chronic kidney disease"  °  °  °Has the patient had major surgery during 100 days prior to admission? Yes °  °Family History   °family history is not on file. °  °Current Medications °  °Current Facility-Administered Medications:  °   stroke: mapping our early stages of recovery book, , Does not apply, Once, Chen, Eric, DO °  acetaminophen (TYLENOL) tablet 650 mg, 650 mg, Oral, Q4H PRN **OR** acetaminophen (  TYLENOL) 160 MG/5ML solution 650 mg, 650 mg, Per Tube, Q4H PRN **OR** acetaminophen (TYLENOL) suppository 650 mg, 650 mg, Rectal, Q4H PRN, Chen, Eric, DO °  aspirin EC tablet 81 mg, 81 mg, Oral, Daily, Xu, Jindong, MD, 81 mg at 06/03/21 0934 °  clopidogrel (PLAVIX) tablet 75 mg, 75 mg, Oral, Daily, Xu, Jindong, MD, 75 mg at 06/03/21 0934 °  doxazosin (CARDURA) tablet 2 mg, 2 mg, Oral, QPM, Chen, Eric, DO, 2 mg at 06/02/21 2210 °  heparin injection 5,000 Units, 5,000 Units, Subcutaneous, Q8H, Chen, Eric, DO, 5,000 Units at 06/03/21 1430 °  lactated ringers infusion, , Intravenous, Continuous, Amery, Sahar, MD, Last Rate: 75  mL/hr at 06/03/21 1428, New Bag at 06/03/21 1428 °  senna-docusate (Senokot-S) tablet 1 tablet, 1 tablet, Oral, QHS PRN, Chen, Eric, DO °  °Patients Current Diet:  °Diet Order   °  °         °    DIET DYS 2 Room service appropriate? No; Fluid consistency: Thin  Diet effective now       °  °  °   °  °  °   °  °  °Precautions / Restrictions °Precautions °Precautions: Fall °Precaution Comments: left side weakness °Restrictions °Weight Bearing Restrictions: No  °  °Has the patient had 2 or more falls or a fall with injury in the past year? Yes °  °Prior Activity Level °Community (5-7x/wk): Pt. was active in the community PTA °  °Prior Functional Level °Self Care: Did the patient need help bathing, dressing, using the toilet or eating? Independent °  °Indoor Mobility: Did the patient need assistance with walking from room to room (with or without device)? Independent °  °Stairs: Did the patient need assistance with internal or external stairs (with or without device)? Independent °  °Functional Cognition: Did the patient need help planning regular tasks such as shopping or remembering to take medications? Independent °  °Patient Information °Are you of Hispanic, Latino/a,or Spanish origin?: A. No, not of Hispanic, Latino/a, or Spanish origin °What is your race?: A. White °Do you need or want an interpreter to communicate with a doctor or health care staff?: 0. No °  °Patient's Response To:  °Health Literacy and Transportation °Is the patient able to respond to health literacy and transportation needs?: Yes °Health Literacy - How often do you need to have someone help you when you read instructions, pamphlets, or other written material from your doctor or pharmacy?: Often °In the past 12 months, has lack of transportation kept you from medical appointments or from getting medications?: No °In the past 12 months, has lack of transportation kept you from meetings, work, or from getting things needed for daily living?: No °   °Home Assistive Devices / Equipment °Home Equipment: None °  °Prior Device Use: Indicate devices/aids used by the patient prior to current illness, exacerbation or injury? Walker °  °Current Functional Level °Cognition °  Overall Cognitive Status: Impaired/Different from baseline °Orientation Level: Oriented to person, Oriented to place, Oriented to situation °General Comments: Patient having difficulty with motor planning getting out of bed. Also unable to problem solve navigating walker around bed running into frame possibly due to some left inattention °   °Extremity Assessment °(includes Sensation/Coordination) °  Upper Extremity Assessment: Defer to OT evaluation °RUE Deficits / Details: AROM grossly intact biceps 4-/5 triceps 4/5 grip 4+/5 °RUE Coordination: WNL °LUE Deficits / Details: AROM grossly intact, biceps 3+/5, triceps 4-/5, grip 4/5 °  LUE Coordination: decreased fine motor  °Lower Extremity Assessment: LLE deficits/detail °LLE Deficits / Details: noted knee flexed with stance, decreased fine motor, upgoing toes with stimulation  °   °ADLs °  Overall ADL's : Needs assistance/impaired °Grooming: Supervision/safety, Sitting °Upper Body Bathing: Min guard, Sitting °Lower Body Bathing: Maximal assistance, Sitting/lateral leans, Sit to/from stand °Lower Body Bathing Details (indicate cue type and reason): due to impaired sitting and standing balance °Upper Body Dressing : Min guard, Sitting °Lower Body Dressing: Maximal assistance, Sitting/lateral leans, Sit to/from stand °Lower Body Dressing Details (indicate cue type and reason): Have patient attempt donning socks has posterior loss of balance trying to don R sock and getting stuck on toe nails. Patient unable to complete figure 4 method to don L sock needing total A and again difficulty maintaining sitting balance even to lift foot up for OT to don sock. °Toilet Transfer: Minimal assistance, Ambulation, Rolling walker (2 wheels) °Toilet Transfer Details  (indicate cue type and reason): Patient min A throughout transfer and ambulation in room with walker for steadying assistance. Patient running walker into bed and appears to have some left inattention °Toileting- Clothing Manipulation and Hygiene: Maximal assistance, Sit to/from stand °Toileting - Clothing Manipulation Details (indicate cue type and reason): To don clean brief, limited standing balance needing to hold onto walker. °Functional mobility during ADLs: Minimal assistance, Cueing for safety, Cueing for sequencing, Rolling walker (2 wheels) °General ADL Comments: Patient needing increased assistance with self care tasks due to impaired balance, coordination, strength, safety awareness  °   °Mobility °  Overal bed mobility: Needs Assistance °Bed Mobility: Supine to Sit, Sit to Supine °Supine to sit: Max assist, HOB elevated °Sit to supine: Mod assist °General bed mobility comments: Cue patient to sit up at edge of bed. Appearing to have difficulty motor planning trying to sit up with legs on the bed. When cued to bring legs off edge having difficulty sequencing needing max A.  °   °Transfers °  Overall transfer level: Needs assistance °Equipment used: Rolling walker (2 wheels) °Transfers: Sit to/from Stand °Sit to Stand: Min assist °General transfer comment: noted decreased L hand grip on Rw, slipping off. Decreased LLE position/control  when stood up  °   °Ambulation / Gait / Stairs / Wheelchair Mobility °  Ambulation/Gait °Ambulation/Gait assistance: Mod assist °Gait Distance (Feet): 20 Feet °Assistive device: Rolling walker (2 wheels) °Gait Pattern/deviations: Step-to pattern, Step-through pattern, Staggering left, Drifts right/left °General Gait Details: noted decreased swing/ control when stepping with Left leg, Left knee flexed in stance. Patient listing to the left when ambulating, required support of therapist °Gait velocity: decr  °   °Posture / Balance Dynamic Sitting Balance °Sitting balance -  Comments: needing up to min A at times due to posterior loss of balance °Balance °Overall balance assessment: Needs assistance °Sitting-balance support: Feet supported °Sitting balance-Leahy Scale: Poor °Sitting balance - Comments: needing up to min A at times due to posterior loss of balance °Standing balance support: Bilateral upper extremity supported, During functional activity, Reliant on assistive device for balance °Standing balance-Leahy Scale: Poor °Standing balance comment: lateral lean to left  °   °Special needs/care consideration Skin intact  °  °Previous Home Environment (from acute therapy documentation) °Living Arrangements: Children (daughter) ° Lives With: Daughter °Available Help at Discharge: Family °Type of Home: House °Home Layout: Able to live on main level with bedroom/bathroom °Home Access: Stairs to enter, Ramped entrance °Bathroom Shower/Tub: Tub/shower unit °Bathroom Toilet: Standard °Bathroom Accessibility:   Yes °How Accessible: Accessible via walker °Home Care Services: No °  °Discharge Living Setting °Plans for Discharge Living Setting: House °Type of Home at Discharge: House °Discharge Home Layout: One level °Discharge Home Access: Stairs to enter °Entrance Stairs-Rails: None °Entrance Stairs-Number of Steps: 2-3 °Discharge Bathroom Shower/Tub: Tub/shower unit °Discharge Bathroom Toilet: Standard °Discharge Bathroom Accessibility: Yes °How Accessible: Accessible via walker °  °Social/Family/Support Systems °Patient Roles: Spouse, Other (Comment) °Contact Information: [phone] °Anticipated Caregiver: Jilda Moore °Anticipated Caregiver's Contact Information: [phone] °Ability/Limitations of Caregiver: Can provide min A °Caregiver Availability: 24/7 °Discharge Plan Discussed with Primary Caregiver: Yes °Is Caregiver In Agreement with Plan?: No °Does Caregiver/Family have Issues with Lodging/Transportation while Pt is in Rehab?: Yes °  °Goals °Patient/Family Goal for Rehab:  PT/OT/SLP Supervision °Expected length of stay: 8 -10 days °Pt/Family Agrees to Admission and willing to participate: Yes °Program Orientation Provided & Reviewed with Pt/Caregiver Including Roles  & Responsibilities: Yes °  °Decrease burden of Care through IP rehab admission: Specialzed equipment needs, Decrease number of caregivers, Bowel and bladder program, and Patient/family education °  °Possible need for SNF placement upon discharge: not anticipated  °  °Patient Condition: I have reviewed medical records from [facility], spoken with CM, and patient, son, and daughter. I met with patient at the bedside for inpatient rehabilitation assessment.  Patient will benefit from ongoing PT, OT, and SLP, can actively participate in 3 hours of therapy a day 5 days of the week, and can make measurable gains during the admission.  Patient will also benefit from the coordinated team approach during an Inpatient Acute Rehabilitation admission.  The patient will receive intensive therapy as well as Rehabilitation physician, nursing, social worker, and care management interventions.  Due to safety, skin/wound care, disease management, medication administration, and patient education the patient requires 24 hour a day rehabilitation nursing.  The patient is currently min A with mobility and basic ADLs.  Discharge setting and therapy post discharge at home with home health is anticipated.  Patient has agreed to participate in the Acute Inpatient Rehabilitation Program and will admit today. °  °Preadmission Screen Completed By:  Laura B Staley, 06/03/2021 3:01 PM °______________________________________________________________________   °Discussed status with Dr. Rayssa Atha on 06/06/2021 at 1519 and received approval for admission today. °  °Admission Coordinator:  Laura B Staley, CCC-SLP, time 1519/Date 06/06/2021  °  °Assessment/Plan: °Diagnosis: Ischemic stroke °Does the need for close, 24 hr/day Medical supervision  in concert with the patient's rehab needs make it unreasonable for this patient to be served in a less intensive setting? Yes °Co-Morbidities requiring supervision/potential complications: hyponatremia, HTN, facial droop, dysarthria, stage 3 CKD °Due to bladder management, bowel management, safety, skin/wound care, disease management, medication administration, pain management, and patient education, does the patient require 24 hr/day rehab nursing? Yes °Does the patient require coordinated care of a physician, rehab nurse, PT, OT, and SLP to address physical and functional deficits in the context of the above medical diagnosis(es)? Yes °Addressing deficits in the following areas: balance, endurance, locomotion, strength, transferring, bowel/bladder control, bathing, dressing, and feeding °Can the patient actively participate in an intensive therapy program of at least 3 hrs of therapy 5 days a week? Yes °The potential for patient to make measurable gains while on inpatient rehab is good °Anticipated functional outcomes upon discharge from inpatient rehab: supervision PT, supervision OT, supervision SLP °Estimated rehab length of stay to reach the above functional goals is: 10-14 days °Anticipated discharge destination: Home °10. Overall Rehab/Functional

## 2021-06-03 NOTE — Progress Notes (Signed)
°  Echocardiogram 2D Echocardiogram has been performed.  Fidel Levy 06/03/2021, 2:58 PM

## 2021-06-03 NOTE — Progress Notes (Signed)
Inpatient Rehab Admissions Coordinator:    I met with Pt. And family regarding potential CIR admit. They state interest and would family states they can rotate to provide 24/7 care. I will pursue for potential admit pending insurance auth and bed availability.   Clemens Catholic, [place], [place] Admissions Coordinator  [phone] ([place]) [phone] (office)

## 2021-06-03 NOTE — Progress Notes (Signed)
PROGRESS NOTE    Allen Torres  LFY:[id number] DOB: 1940-11-16 DOA: 06/01/2021 PCP: Patient, No Pcp Per (Inactive)   Brief Narrative:  Patient is 81 year old male with past medical history of hypertension, chronic kidney disease, hyperlipidemia presented with left-sided weakness and dysarthria.  On arrival to the ER temp 98.7 heart rate 71 blood pressure 157/76. Labs show sodium 130 potassium 4.4 BUN of 31 creatinine 1.3. White count 6.1, hemoglobin 12.3, platelets 198. Urine drug abuse negative. COVID and flu swabs are negative. EKG showed normal sinus rhythm.  First-degree AV block. CT head demonstrated old small vessel infarctions of bilateral thalamus, basal ganglia/deep white matter.CT angio head demonstrated no intracranial lesions or high-grade stenosis.   EDP discussed the case with stroke neurology on-call who wanted the patient transferred to [facility].  Assessment & Plan:  Acute ischemic stroke: -Patient presented with left-sided weakness and slurred speech and facial drop. -MRI brain shows small area of acute/subacute ischemia in the right centrum semiovale.  No hemorrhage or mass effect.,  UA, UDS: Negative.  TSH: WNL -CT angio of head and neck shows no intracranial occlusion or stenosis. 2/18 MRI with small area of acute/subacute ischemia right centrum semiovale. No hemorrhage. Goal LDL less than 70 Permissive hypertension We will follow-up with neurology PT recommends acute rehab   Hyperlipidemia: Continue statins  AKI: Creatinine about 1.59 Likely prerenal We will start gentle hydration Avoid nephrotoxic meds   Hypertension:  Stable      Hyponatremia l likely secondary to chlorthalidone.   TSH normal  Now resolved  Avoid chlorthalidone on discharge     Hypomagnesemia:  Replenished.     Normocytic anemia:  H&H is stable.  Continue to monitor.   DVT prophylaxis: Heparin Code Status: DNR Family Communication: None at bedside  i   Disposition Plan: CIR when stable  Consultants:  Neurology  Procedures:  None  Antimicrobials:  None  Status is: Inpatient Patient remains inpatient due to unsafe discharge which he needs CIR.  IV treatment.  Stroke work-up   Subjective: Denies any shortness of breath or chest pain.  Tried to eat breakfast but could not due to taste and worsen his own cooking per patient.    Objective: Vitals:   06/02/21 2005 06/02/21 2358 06/03/21 0414 06/03/21 0738  BP: 132/83 (!) 107/47 (!) 121/59 (!) 122/52  Pulse: 90 69 62 69  Resp: 18 18 18    Temp: 97.7 F (36.5 C) 98.8 F (37.1 C) 98.2 F (36.8 C) 98.4 F (36.9 C)  TempSrc: Oral Oral Oral Oral  SpO2:  96% 97% 95%  Weight:      Height:        Intake/Output Summary (Last 24 hours) at 06/03/2021 1252 Last data filed at 06/03/2021 0500 Gross per 24 hour  Intake --  Output 300 ml  Net -300 ml   Filed Weights   06/01/21 2114  Weight: 108.9 kg    Examination: Calm, NAD Cta no w/r Reg s1/s2 no gallop Soft benign +bs No edema Aaoxox3  RUE 5/5; LUE 3/5, 5/5 Lex2 Lt facial droop.. finger to nose stable.  Difficult to raise left upper extremity all the way above shoulders Mood and affect appropriate in current setting    Data Reviewed: I have personally reviewed following labs and imaging studies  CBC: Recent Labs  Lab 06/01/21 2102 06/01/21 2110 06/02/21 0211  WBC 6.1  --  6.5  NEUTROABS 3.3  --  3.3  HGB 12.3* 12.6* 11.6*  HCT 36.1* 37.0* 33.3*  MCV 98.1  --  96.2  PLT 198  --  0000000   Basic Metabolic Panel: Recent Labs  Lab 06/01/21 2102 06/01/21 2110 06/02/21 0211 06/03/21 0446  NA 130* 133* 132* 136  K 4.4 4.5 4.1 4.4  CL 101 102 100 102  CO2 22  --  23 24  GLUCOSE 186* 184* 90 88  BUN 31* 26* 29* 24*  CREATININE 1.33* 1.40* 1.26* 1.59*  CALCIUM 9.4  --  9.4 9.4  MG  --   --  1.6* 1.9   GFR: Estimated Creatinine Clearance: 46.5 mL/min (A) (by C-G formula based on SCr of 1.59 mg/dL  (H)). Liver Function Tests: Recent Labs  Lab 06/01/21 2102 06/02/21 0211  AST 22 17  ALT 17 15  ALKPHOS 69 64  BILITOT 0.5 0.7  PROT 6.9 6.6  ALBUMIN 4.1 3.8   No results for input(s): LIPASE, AMYLASE in the last 168 hours. No results for input(s): AMMONIA in the last 168 hours. Coagulation Profile: Recent Labs  Lab 06/01/21 2102  INR 1.1   Cardiac Enzymes: No results for input(s): CKTOTAL, CKMB, CKMBINDEX, TROPONINI in the last 168 hours. BNP (last 3 results) No results for input(s): PROBNP in the last 8760 hours. HbA1C: Recent Labs    06/02/21 0211  HGBA1C 6.1*   CBG: Recent Labs  Lab 06/01/21 2136  GLUCAP 146*   Lipid Profile: Recent Labs    06/02/21 0211  CHOL 218*  HDL 29*  LDLCALC 132*  TRIG 287*  CHOLHDL 7.5   Thyroid Function Tests: Recent Labs    06/01/21 2102  TSH 2.587   Anemia Panel: No results for input(s): VITAMINB12, FOLATE, FERRITIN, TIBC, IRON, RETICCTPCT in the last 72 hours. Sepsis Labs: No results for input(s): PROCALCITON, LATICACIDVEN in the last 168 hours.  Recent Results (from the past 240 hour(s))  Resp Panel by RT-PCR (Flu A&B, Covid) Nasopharyngeal Swab     Status: None   Collection Time: 06/01/21  9:40 PM   Specimen: Nasopharyngeal Swab; Nasopharyngeal(NP) swabs in vial transport medium  Result Value Ref Range Status   SARS Coronavirus 2 by RT PCR NEGATIVE NEGATIVE Final    Comment: (NOTE) SARS-CoV-2 target nucleic acids are NOT DETECTED.  The SARS-CoV-2 RNA is generally detectable in upper respiratory specimens during the acute phase of infection. The lowest concentration of SARS-CoV-2 viral copies this assay can detect is 138 copies/mL. A negative result does not preclude SARS-Cov-2 infection and should not be used as the sole basis for treatment or other patient management decisions. A negative result may occur with  improper specimen collection/handling, submission of specimen other than nasopharyngeal swab,  presence of viral mutation(s) within the areas targeted by this assay, and inadequate number of viral copies(<138 copies/mL). A negative result must be combined with clinical observations, patient history, and epidemiological information. The expected result is Negative.  Fact Sheet for Patients:  EntrepreneurPulse.com.au  Fact Sheet for Healthcare Providers:  IncredibleEmployment.be  This test is no t yet approved or cleared by the [place] FDA and  has been authorized for detection and/or diagnosis of SARS-CoV-2 by FDA under an Emergency Use Authorization (EUA). This EUA will remain  in effect (meaning this test can be used) for the duration of the COVID-19 declaration under Section 564(b)(1) of the Act, 21 U.S.C.section 360bbb-3(b)(1), unless the authorization is terminated  or revoked sooner.       Influenza A by PCR NEGATIVE NEGATIVE Final   Influenza B by PCR NEGATIVE NEGATIVE Final  Comment: (NOTE) The Xpert Xpress SARS-CoV-2/FLU/RSV plus assay is intended as an aid in the diagnosis of influenza from Nasopharyngeal swab specimens and should not be used as a sole basis for treatment. Nasal washings and aspirates are unacceptable for Xpert Xpress SARS-CoV-2/FLU/RSV testing.  Fact Sheet for Patients: EntrepreneurPulse.com.au  Fact Sheet for Healthcare Providers: IncredibleEmployment.be  This test is not yet approved or cleared by the [place] FDA and has been authorized for detection and/or diagnosis of SARS-CoV-2 by FDA under an Emergency Use Authorization (EUA). This EUA will remain in effect (meaning this test can be used) for the duration of the COVID-19 declaration under Section 564(b)(1) of the Act, 21 U.S.C. section 360bbb-3(b)(1), unless the authorization is terminated or revoked.  Performed at [facility], [place] [address] [place], [place] [address]        Radiology Studies: MR BRAIN WO CONTRAST  Result Date: 06/02/2021 CLINICAL DATA:  Acute neurologic deficit EXAM: MRI HEAD WITHOUT CONTRAST TECHNIQUE: Multiplanar, multiecho pulse sequences of the brain and surrounding structures were obtained without intravenous contrast. COMPARISON:  Head CT 06/01/2021 FINDINGS: Brain: Small area of abnormal diffusion restriction in the right centrum semiovale. No acute or chronic hemorrhage. There is multifocal hyperintense T2-weighted signal within the white matter. Generalized volume loss without a clear lobar predilection. The midline structures are normal. Vascular: Major flow voids are preserved. Skull and upper cervical spine: Normal calvarium and skull base. Visualized upper cervical spine and soft tissues are normal. Sinuses/Orbits:No paranasal sinus fluid levels or advanced mucosal thickening. No mastoid or middle ear effusion. Normal orbits. IMPRESSION: 1. Small area of acute/subacute ischemia in the right centrum semiovale. No hemorrhage or mass effect. 2. Findings of chronic microvascular ischemia and generalized volume loss. Electronically Signed   By: Ulyses Jarred M.D.   On: 06/02/2021 00:52   CT HEAD CODE STROKE WO CONTRAST  Result Date: 06/01/2021 CLINICAL DATA:  Code stroke. Neuro deficit, acute, stroke suspected. EXAM: CT HEAD WITHOUT CONTRAST TECHNIQUE: Contiguous axial images were obtained from the base of the skull through the vertex without intravenous contrast. RADIATION DOSE REDUCTION: This exam was performed according to the departmental dose-optimization program which includes automated exposure control, adjustment of the mA and/or kV according to patient size and/or use of iterative reconstruction technique. COMPARISON:  None. FINDINGS: Brain: No abnormality seen affecting the brainstem or cerebellum. Cerebral hemispheres show age related atrophy with old small vessel infarctions of the thalami, basal ganglia and hemispheric white matter. No  sign of acute infarction, mass lesion, hemorrhage, hydrocephalus or extra-axial collection. Vascular: There is atherosclerotic calcification of the major vessels at the base of the brain. Skull: Negative Sinuses/Orbits: Clear/normal Other: None ASPECTS ([place] Stroke Program Early CT Score) - Ganglionic level infarction (caudate, lentiform nuclei, internal capsule, insula, M1-M3 cortex): 7 - Supraganglionic infarction (M4-M6 cortex): 3 Total score (0-10 with 10 being normal): 10 IMPRESSION: 1. No acute CT finding. 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 2. ASPECTS is 10. 3. These results were called by telephone at the time of interpretation on 06/01/2021 at 9:20 pm to provider Dr. Alvino Chapel, who verbally acknowledged these results. Electronically Signed   By: Nelson Chimes M.D.   On: 06/01/2021 21:26   CT ANGIO HEAD NECK W WO CM W PERF (CODE STROKE)  Result Date: 06/01/2021 CLINICAL DATA:  CODE STROKE EXAM: CT ANGIOGRAPHY HEAD AND NECK CT PERFUSION BRAIN TECHNIQUE: Multidetector CT imaging of the head and neck was performed using the  standard protocol during bolus administration of intravenous contrast. Multiplanar CT image reconstructions and MIPs were obtained to evaluate the vascular anatomy. Carotid stenosis measurements (when applicable) are obtained utilizing NASCET criteria, using the distal internal carotid diameter as the denominator. Multiphase CT imaging of the brain was performed following IV bolus contrast injection. Subsequent parametric perfusion maps were calculated using RAPID software. RADIATION DOSE REDUCTION: This exam was performed according to the departmental dose-optimization program which includes automated exposure control, adjustment of the mA and/or kV according to patient size and/or use of iterative reconstruction technique. CONTRAST:  13mL OMNIPAQUE IOHEXOL 350 MG/ML SOLN, 59mL OMNIPAQUE IOHEXOL 350 MG/ML SOLN COMPARISON:   None. FINDINGS: CTA NECK FINDINGS SKELETON: There is no bony spinal canal stenosis. No lytic or blastic lesion. OTHER NECK: Normal pharynx, larynx and major salivary glands. No cervical lymphadenopathy. Unremarkable thyroid gland. UPPER CHEST: No pneumothorax or pleural effusion. No nodules or masses. AORTIC ARCH: There is calcific atherosclerosis of the aortic arch. There is decreased enhancement of the visualized portion of the descending aorta. The visualized proximal subclavian arteries are widely patent. There are numerous prominent collaterals in the posterior mediastinum and paraspinous musculature. RIGHT CAROTID SYSTEM: No dissection, occlusion or aneurysm. There is mixed density atherosclerosis extending into the proximal ICA, resulting in less than 50% stenosis. LEFT CAROTID SYSTEM: Normal without aneurysm, dissection or stenosis. VERTEBRAL ARTERIES: Right dominant configuration. Both origins are clearly patent. There is no dissection, occlusion or flow-limiting stenosis to the skull base (V1-V3 segments). CTA HEAD FINDINGS POSTERIOR CIRCULATION: --Vertebral arteries: Normal V4 segments. --Inferior cerebellar arteries: Normal. --Basilar artery: Normal. --Superior cerebellar arteries: Normal. --Posterior cerebral arteries (PCA): Normal. ANTERIOR CIRCULATION: --Intracranial internal carotid arteries: Normal. --Anterior cerebral arteries (ACA): Normal. Both A1 segments are present. Patent anterior communicating artery (a-comm). --Middle cerebral arteries (MCA): Normal. VENOUS SINUSES: As permitted by contrast timing, patent. ANATOMIC VARIANTS: None Review of the MIP images confirms the above findings. CT Brain Perfusion Findings: Perfusion data is unreliable due to motion artifacts and abnormal bolus characteristics. IMPRESSION: 1. No intracranial arterial occlusion or high-grade stenosis. 2. Unreliable perfusion data due to motion artifacts and abnormal bolus characteristics. 3. 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 Further characterization with CTA of the chest is recommended. 4. Mixed density right carotid bifurcation plaque with less than 50% stenosis. Aortic Atherosclerosis (ICD10-I70.0). Electronically Signed   By: Ulyses Jarred M.D.   On: 06/01/2021 22:20    Scheduled Meds:   stroke: mapping our early stages of recovery book   Does not apply Once   aspirin EC  81 mg Oral Daily   clopidogrel  75 mg Oral Daily   doxazosin  2 mg Oral QPM   ezetimibe  10 mg Oral Daily   heparin  5,000 Units Subcutaneous Q8H   Continuous Infusions:   LOS: 1 day   Time spent: 35 minutes   Nolberto Hanlon, MD [facility]  If 7PM-7AM, please contact night-coverage [email or web address] 06/03/2021, 12:52 PM

## 2021-06-03 NOTE — Progress Notes (Addendum)
Patient got out of chair back to bed without waiting; chair alarm did sound and staff responded quickly to bedside; fall safety reviewed again with patient; no fall; remains high fall risk.

## 2021-06-04 DIAGNOSIS — I639 Cerebral infarction, unspecified: Secondary | ICD-10-CM | POA: Diagnosis not present

## 2021-06-04 MED ORDER — HYDRALAZINE HCL 20 MG/ML IJ SOLN: 10.0000 mg | Freq: Four times a day (QID) | INTRAMUSCULAR | Status: DC | PRN

## 2021-06-04 MED ORDER — BISACODYL 10 MG RE SUPP: 10.0000 mg | Freq: Every day | RECTAL | Status: DC | PRN

## 2021-06-04 MED ORDER — MECLIZINE HCL 12.5 MG PO TABS: 25.0000 mg | ORAL_TABLET | Freq: Three times a day (TID) | ORAL | Status: DC | PRN

## 2021-06-04 MED ORDER — DOXAZOSIN MESYLATE 1 MG PO TABS
1.0000 mg | ORAL_TABLET | Freq: Two times a day (BID) | ORAL | Status: DC
  Administered 2021-06-04 – 2021-06-06 (×5): 1 mg via ORAL
  Filled 2021-06-04 (×6): qty 1

## 2021-06-04 MED ORDER — MECLIZINE HCL 12.5 MG PO TABS: 12.5000 mg | ORAL_TABLET | Freq: Three times a day (TID) | ORAL | Status: DC | PRN

## 2021-06-04 NOTE — Progress Notes (Addendum)
Ambulated to the bathroom; easily becomes emotional; states his frustrations post stroke; denied dizziness with ambulation to bathroom; c/o feeling like he is leaning;speech remains slurred and needs prompts with left hand to remain on the walker; gait was stable with walker; Continue to monitor and maintain safety. Therapies reordered; patient not seen since [facility] ED.

## 2021-06-04 NOTE — Progress Notes (Signed)
Patient didn't request meclizine last night; reports dizziness only when moved out of bed; continue to monitor.

## 2021-06-04 NOTE — Progress Notes (Signed)
PROGRESS NOTE    Allen Torres Allen Torres  ZOX:[id number] DOB: June 19, 1940 DOA: 06/01/2021 PCP: Patient, No Pcp Per (Inactive)   Brief Narrative:  Patient is 81 year old male with past medical history of hypertension, chronic kidney disease, hyperlipidemia presented with left-sided weakness and dysarthria.  On arrival to the ER temp 98.7 heart rate 71 blood pressure 157/76. Labs show sodium 130 potassium 4.4 BUN of 31 creatinine 1.3. White count 6.1, hemoglobin 12.3, platelets 198. Urine drug abuse negative. COVID and flu swabs are negative. EKG showed normal sinus rhythm.  First-degree AV block. CT head demonstrated old small vessel infarctions of bilateral thalamus, basal ganglia/deep white matter.CT angio head demonstrated no intracranial lesions or high-grade stenosis.   EDP discussed the case with stroke neurology on-call who wanted the patient [facility].  2/19 discussed importance of medication compliance, cholesterol control and bp control with the pt. Still refusing cholesterol meds.  Did have dizziness yesterday and was positional and was started on meclizine.   Assessment & Plan:  Acute right CO ischemic stroke: -Patient presented with left-sided weakness and slurred speech and facial drop. -MRI brain shows small area of acute/subacute ischemia in the right centrum semiovale.  No hemorrhage or mass effect.,  UA, UDS: Negative.  TSH: WNL -CT angio of head and neck shows no intracranial occlusion or stenosis. 2/18 MRI with small area of acute/subacute ischemia right centrum semiovale. No hemorrhage. Goal LDL less than 70 2/19 hemoglobin 6.1 No antithrombotic prior to admission, now on aspirin and Plavix.  Per neurology continue DAPT for 3 weeks and then aspirin alone Disposition: CIR pending   Hyperlipidemia: Refuses statins and Zetia or any medication   AKI: Creatinine about 1.59 Likely prerenal 2/19 was started on gentle hydration Creatinine pending Avoid  nephrotoxic meds   Hypertension:  Permissive hypertension initially Change Cardura to 1 mg twice daily     Hyponatremia Allen likely secondary to chlorthalidone.   TSH normal  Now resolved  Avoid chlorthalidone on discharge     Hypomagnesemia:  Replenished.     Normocytic anemia:  H&H is stable.  Continue to monitor.   DVT prophylaxis: Heparin Code Status: DNR Family Communication: None at bedside i   Disposition Plan: CIR when stable  Consultants:  Neurology  Procedures:  None  Antimicrobials:  None  Status is: Inpatient Patient remains inpatient due to unsafe discharge which he needs CIR.  IV treatment.  Stroke work-up   Subjective: Has no new complaints.  No chest pain, shortness of breath, or dizziness   Objective: Vitals:   06/03/21 2359 06/04/21 0433 06/04/21 0754 06/04/21 1159  BP: 130/70 128/68 (!) 178/82 (!) 152/88  Pulse: 72 76 70 77  Resp: 18 16 18 18   Temp: 98.2 F (36.8 C) 98.2 F (36.8 C) 98.2 F (36.8 C) 98.6 F (37 C)  TempSrc: Oral Oral Oral Oral  SpO2: 97% 98% 97% 98%  Weight:      Height:       No intake or output data in the 24 hours ending 06/04/21 1445  Filed Weights   06/01/21 2114  Weight: 108.9 kg    Examination: Calm, NAD Cta no w/r Reg s1/s2 no gallop Soft benign +bs No edema Aaoxox3 slurring speech. Lt upper extremity 3/5  Mood and affect appropriate in current setting   Data Reviewed: I have personally reviewed following labs and imaging studies  CBC: Recent Labs  Lab 06/01/21 2102 06/01/21 2110 06/02/21 0211  WBC 6.1  --  6.5  NEUTROABS  3.3  --  3.3  HGB 12.3* 12.6* 11.6*  HCT 36.1* 37.0* 33.3*  MCV 98.1  --  96.2  PLT 198  --  0000000   Basic Metabolic Panel: Recent Labs  Lab 06/01/21 2102 06/01/21 2110 06/02/21 0211 06/03/21 0446  NA 130* 133* 132* 136  K 4.4 4.5 4.1 4.4  CL 101 102 100 102  CO2 22  --  23 24  GLUCOSE 186* 184* 90 88  BUN 31* 26* 29* 24*  CREATININE 1.33* 1.40* 1.26*  1.59*  CALCIUM 9.4  --  9.4 9.4  MG  --   --  1.6* 1.9   GFR: Estimated Creatinine Clearance: 46.5 mL/min (A) (by C-G formula based on SCr of 1.59 mg/dL (H)). Liver Function Tests: Recent Labs  Lab 06/01/21 2102 06/02/21 0211  AST 22 17  ALT 17 15  ALKPHOS 69 64  BILITOT 0.5 0.7  PROT 6.9 6.6  ALBUMIN 4.1 3.8   No results for input(s): LIPASE, AMYLASE in the last 168 hours. No results for input(s): AMMONIA in the last 168 hours. Coagulation Profile: Recent Labs  Lab 06/01/21 2102  INR 1.1   Cardiac Enzymes: No results for input(s): CKTOTAL, CKMB, CKMBINDEX, TROPONINI in the last 168 hours. BNP (last 3 results) No results for input(s): PROBNP in the last 8760 hours. HbA1C: Recent Labs    06/02/21 0211  HGBA1C 6.1*   CBG: Recent Labs  Lab 06/01/21 2136  GLUCAP 146*   Lipid Profile: Recent Labs    06/02/21 0211  CHOL 218*  HDL 29*  LDLCALC 132*  TRIG 287*  CHOLHDL 7.5   Thyroid Function Tests: Recent Labs    06/01/21 2102  TSH 2.587   Anemia Panel: No results for input(s): VITAMINB12, FOLATE, FERRITIN, TIBC, IRON, RETICCTPCT in the last 72 hours. Sepsis Labs: No results for input(s): PROCALCITON, LATICACIDVEN in the last 168 hours.  Recent Results (from the past 240 hour(s))  Resp Panel by RT-PCR (Flu A&B, Covid) Nasopharyngeal Swab     Status: None   Collection Time: 06/01/21  9:40 PM   Specimen: Nasopharyngeal Swab; Nasopharyngeal(NP) swabs in vial transport medium  Result Value Ref Range Status   SARS Coronavirus 2 by RT PCR NEGATIVE NEGATIVE Final    Comment: (NOTE) SARS-CoV-2 target nucleic acids are NOT DETECTED.  The SARS-CoV-2 RNA is generally detectable in upper respiratory specimens during the acute phase of infection. The lowest concentration of SARS-CoV-2 viral copies this assay can detect is 138 copies/mL. A negative result does not preclude SARS-Cov-2 infection and should not be used as the sole basis for treatment or other  patient management decisions. A negative result may occur with  improper specimen collection/handling, submission of specimen other than nasopharyngeal swab, presence of viral mutation(s) within the areas targeted by this assay, and inadequate number of viral copies(<138 copies/mL). A negative result must be combined with clinical observations, patient history, and epidemiological information. The expected result is Negative.  Fact Sheet for Patients:  EntrepreneurPulse.com.au  Fact Sheet for Healthcare Providers:  IncredibleEmployment.be  This test is no t yet approved or cleared by the [place] FDA and  has been authorized for detection and/or diagnosis of SARS-CoV-2 by FDA under an Emergency Use Authorization (EUA). This EUA will remain  in effect (meaning this test can be used) for the duration of the COVID-19 declaration under Section 564(b)(1) of the Act, 21 U.S.C.section 360bbb-3(b)(1), unless the authorization is terminated  or revoked sooner.       Influenza  A by PCR NEGATIVE NEGATIVE Final   Influenza B by PCR NEGATIVE NEGATIVE Final    Comment: (NOTE) The Xpert Xpress SARS-CoV-2/FLU/RSV plus assay is intended as an aid in the diagnosis of influenza from Nasopharyngeal swab specimens and should not be used as a sole basis for treatment. Nasal washings and aspirates are unacceptable for Xpert Xpress SARS-CoV-2/FLU/RSV testing.  Fact Sheet for Patients: EntrepreneurPulse.com.au  Fact Sheet for Healthcare Providers: IncredibleEmployment.be  This test is not yet approved or cleared by the [place] FDA and has been authorized for detection and/or diagnosis of SARS-CoV-2 by FDA under an Emergency Use Authorization (EUA). This EUA will remain in effect (meaning this test can be used) for the duration of the COVID-19 declaration under Section 564(b)(1) of the Act, 21 U.S.C. section  360bbb-3(b)(1), unless the authorization is terminated or revoked.  Performed at [facility], [place] [address] [place], [place] [address]       Radiology Studies: ECHOCARDIOGRAM COMPLETE  Result Date: 06/03/2021    ECHOCARDIOGRAM REPORT   Patient Name:   Allen Torres Date of Exam: 06/03/2021 Medical Rec #:  [id number]     Height:       71.0 in Accession #:    [id number]    Weight:       240.0 lb Date of Birth:  02-05-1941    BSA:          2.278 m Patient Age:    81 years      BP:           122/52 mmHg Patient Gender: M             HR:           70 bpm. Exam Location:  Inpatient Procedure: 2D Echo, Cardiac Doppler and Color Doppler Indications:    Stroke I63.9  History:        Patient has no prior history of Echocardiogram examinations.                 Risk Factors:Hypertension.  Sonographer:    Bernadene Person RDCS Referring Phys: [place]  1. Left ventricular ejection fraction, by estimation, is 60 to 65%. The left ventricle has normal function. Left ventricular endocardial border not optimally defined to evaluate regional wall motion. 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 Left ventricular diastolic parameters are consistent with Grade I diastolic dysfunction (impaired relaxation). Elevated left atrial pressure.  2. Right ventricular systolic function is normal. The right ventricular size is normal. There is normal pulmonary artery systolic pressure.  3. The mitral valve is normal in structure. No evidence of mitral valve regurgitation. No evidence of mitral stenosis.  4. The aortic valve is tricuspid. Aortic valve regurgitation is not visualized. Mild to moderate aortic valve stenosis. Aortic valve mean gradient measures 12.0 mmHg. Aortic valve peak gradient measures 21.3 mmHg. Aortic valve area, by VTI measures 1.08  cm.     Lower than expected gradient due to decreased SVI  5. Aortic dilatation noted. There is mild  dilatation of the aortic root, measuring 41 mm. 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 FINDINGS  Left Ventricle: Left ventricular ejection fraction, by estimation, is 60 to 65%. The left ventricle has normal function. Left ventricular endocardial border not optimally defined to evaluate regional wall motion. The left ventricular internal cavity size was normal in size. There is severe asymmetric left ventricular hypertrophy of the  septal segment. Left ventricular diastolic parameters are consistent with Grade I diastolic dysfunction (impaired relaxation). Elevated left atrial pressure. Right Ventricle: The right ventricular size is normal. Right vetricular wall thickness was not well visualized. Right ventricular systolic function is normal. There is normal pulmonary artery systolic pressure. The tricuspid regurgitant velocity is 2.29 m/s, and with an assumed right atrial pressure of 8 mmHg, the estimated right ventricular systolic pressure is 0000000 mmHg. Left Atrium: Left atrial size was normal in size. Right Atrium: Right atrial size was normal in size. Pericardium: There is no evidence of pericardial effusion. Mitral Valve: The mitral valve is normal in structure. There is mild thickening of the mitral valve leaflet(s). There is mild calcification of the mitral valve leaflet(s). Mild mitral annular calcification. No evidence of mitral valve regurgitation. No evidence of mitral valve stenosis. Tricuspid Valve: The tricuspid valve is not well visualized. Tricuspid valve regurgitation is mild . No evidence of tricuspid stenosis. Aortic Valve: Lower than expected gradient due to decreased SVI. The aortic valve is tricuspid. Aortic valve regurgitation is not visualized. Mild to moderate aortic stenosis is present. Aortic valve mean gradient measures 12.0 mmHg. Aortic valve peak gradient measures 21.3 mmHg. Aortic valve area, by VTI measures 1.08 cm. Pulmonic Valve: The pulmonic  valve was not well visualized. Pulmonic valve regurgitation is mild. No evidence of pulmonic stenosis. Aorta: Aortic dilatation noted. There is mild dilatation of the aortic root, measuring 41 mm. 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 Venous: The inferior vena cava was not well visualized. IAS/Shunts: The interatrial septum was not well visualized.  LEFT VENTRICLE PLAX 2D LVIDd:         4.80 cm      Diastology LVIDs:         2.90 cm      LV e' medial:    3.24 cm/s LV PW:         1.40 cm      LV E/e' medial:  18.3 LV IVS:        1.90 cm      LV e' lateral:   4.91 cm/s LVOT diam:     2.10 cm      LV E/e' lateral: 12.1 LV SV:         51 LV SV Index:   22 LVOT Area:     3.46 cm  LV Volumes (MOD) LV vol d, MOD A2C: 89.3 ml LV vol d, MOD A4C: 103.0 ml LV vol s, MOD A2C: 54.8 ml LV vol s, MOD A4C: 60.4 ml LV SV MOD A2C:     34.5 ml LV SV MOD A4C:     103.0 ml LV SV MOD BP:      41.9 ml RIGHT VENTRICLE RV S prime:     7.10 cm/s TAPSE (M-mode): 1.5 cm LEFT ATRIUM             Index        RIGHT ATRIUM           Index LA diam:        3.90 cm 1.71 cm/m   RA Area:     13.20 cm LA Vol (A2C):   44.6 ml 19.57 ml/m  RA Volume:   27.50 ml  12.07 ml/m LA Vol (A4C):   62.3 ml 27.34 ml/m LA Biplane Vol: 53.4 ml 23.44 ml/m  AORTIC VALVE AV Area (Vmax):    1.02 cm AV Area (Vmean):   1.02 cm  AV Area (VTI):     1.08 cm AV Vmax:           230.67 cm/s AV Vmean:          161.000 cm/s AV VTI:            0.470 m AV Peak Grad:      21.3 mmHg AV Mean Grad:      12.0 mmHg LVOT Vmax:         68.17 cm/s LVOT Vmean:        47.200 cm/s LVOT VTI:          0.146 m LVOT/AV VTI ratio: 0.31  AORTA Ao Root diam: 4.10 cm Ao Asc diam:  4.90 cm MITRAL VALVE                TRICUSPID VALVE MV Area (PHT): 3.34 cm     TR Peak grad:   21.0 mmHg MV Decel Time: 227 msec     TR Vmax:        229.00 cm/s MV E velocity: 59.40 cm/s MV A velocity: 124.00 cm/s  SHUNTS MV E/A ratio:  0.48         Systemic VTI:  0.15 m                              Systemic Diam: 2.10 cm Carlyle Dolly MD Electronically signed by Carlyle Dolly MD Signature Date/Time: 06/03/2021/5:34:23 PM    Final     Scheduled Meds:   stroke: mapping our early stages of recovery book   Does not apply Once   aspirin EC  81 mg Oral Daily   clopidogrel  75 mg Oral Daily   doxazosin  1 mg Oral Q12H   heparin  5,000 Units Subcutaneous Q8H   Continuous Infusions:   LOS: 2 days   Time spent: 35 minutes   Nolberto Hanlon, MD [facility]  If 7PM-7AM, please contact night-coverage [email or web address] 06/04/2021, 2:45 PM

## 2021-06-05 DIAGNOSIS — I639 Cerebral infarction, unspecified: Secondary | ICD-10-CM | POA: Diagnosis not present

## 2021-06-05 LAB — CREATININE, SERUM
Creatinine, Ser: 1.56 mg/dL — ABNORMAL HIGH (ref 0.61–1.24)
GFR, Estimated: 45 mL/min — ABNORMAL LOW (ref >60)

## 2021-06-05 NOTE — Progress Notes (Signed)
PROGRESS NOTE    Allen BordersJames L Torres  ZOX:[id number] DOB: 1940/08/27 DOA: 06/01/2021 PCP: Patient, No Pcp Per (Inactive)   Brief Narrative:  Patient is 81 year old male with past medical history of hypertension, chronic kidney disease, hyperlipidemia presented with left-sided weakness and dysarthria.  On arrival to the ER temp 98.7 heart rate 71 blood pressure 157/76. Labs show sodium 130 potassium 4.4 BUN of 31 creatinine 1.3. White count 6.1, hemoglobin 12.3, platelets 198. Urine drug abuse negative. COVID and flu swabs are negative. EKG showed normal sinus rhythm.  First-degree AV block. CT head demonstrated old small vessel infarctions of bilateral thalamus, basal ganglia/deep white matter.CT angio head demonstrated no intracranial lesions or high-grade stenosis.   EDP discussed the case with stroke neurology on-call who wanted the patient [facility].  2/19 discussed importance of medication compliance, cholesterol control and bp control with the pt. Still refusing cholesterol meds.  Did have dizziness yesterday and was positional and was started on meclizine.  2/20 able to understand his speech better today. Walked with PT. Currently working with OT    Assessment & Plan:  Acute right CO ischemic stroke: -Patient presented with left-sided weakness and slurred speech and facial drop. -MRI brain shows small area of acute/subacute ischemia in the right centrum semiovale.  No hemorrhage or mass effect.,  UA, UDS: Negative.  TSH: WNL -CT angio of head and neck shows no intracranial occlusion or stenosis. 2/18 MRI with small area of acute/subacute ischemia right centrum semiovale. No hemorrhage. Goal LDL less than 70  A1C 6.1 No antithrombotic prior to admission, now on aspirin and Plavix.  Per neurology continue DAPT for 3 weeks and then aspirin alone 2/20 disposition : CIR pending   Hyperlipidemia: Refuses statin and zetia or any medication.   AKI: Likely  prerenal 2/20was started on ivf initially Creatinine stablized, likely has underlying ckd. Avoid nephrotoxic meds    Hypertension:  Improving Continue cardura and monitor      Hyponatremia l likely secondary to chlorthalidone.   TSH normal  Now resolved  Avoid chlorthalidone on discharge     Hypomagnesemia:  Replenished.     Normocytic anemia:  H&H is stable.  Continue to monitor.   DVT prophylaxis: Heparin Code Status: DNR Family Communication: son at bedside   Disposition Plan: CIR   Consultants:  Neurology  Procedures:  None  Antimicrobials:  None  Status is: Inpatient Patient remains inpatient due to unsafe discharge which he needs CIR.  IV treatment.  Stroke work-up   Subjective: No new complaints. No sob or cp. Little dizzy if gets up too fast   Objective: Vitals:   06/04/21 1500 06/04/21 1959 06/05/21 0419 06/05/21 1156  BP: (!) 156/83 125/71 (!) 158/74 135/74  Pulse: 86 91 72 93  Resp: 18 18 18 18   Temp: 99 F (37.2 C) 98.7 F (37.1 C)  98.1 F (36.7 C)  TempSrc:  Oral  Oral  SpO2: 95% 97% 94% 96%  Weight:      Height:       No intake or output data in the 24 hours ending 06/05/21 1320  Filed Weights   06/01/21 2114  Weight: 108.9 kg    Examination: Calm, NAD Cta no w/r Reg s1/s2 no gallop Soft benign +bs No edema Aaoxox3 Lt facial droop.standing with OT Mood and affect appropriate in current setting   Data Reviewed: I have personally reviewed following labs and imaging studies  CBC: Recent Labs  Lab 06/01/21 2102 06/01/21 2110 06/02/21  0211  WBC 6.1  --  6.5  NEUTROABS 3.3  --  3.3  HGB 12.3* 12.6* 11.6*  HCT 36.1* 37.0* 33.3*  MCV 98.1  --  96.2  PLT 198  --  0000000   Basic Metabolic Panel: Recent Labs  Lab 06/01/21 2102 06/01/21 2110 06/02/21 0211 06/03/21 0446 06/05/21 0841  NA 130* 133* 132* 136  --   K 4.4 4.5 4.1 4.4  --   CL 101 102 100 102  --   CO2 22  --  23 24  --   GLUCOSE 186* 184* 90  88  --   BUN 31* 26* 29* 24*  --   CREATININE 1.33* 1.40* 1.26* 1.59* 1.56*  CALCIUM 9.4  --  9.4 9.4  --   MG  --   --  1.6* 1.9  --    GFR: Estimated Creatinine Clearance: 47.4 mL/min (A) (by C-G formula based on SCr of 1.56 mg/dL (H)). Liver Function Tests: Recent Labs  Lab 06/01/21 2102 06/02/21 0211  AST 22 17  ALT 17 15  ALKPHOS 69 64  BILITOT 0.5 0.7  PROT 6.9 6.6  ALBUMIN 4.1 3.8   No results for input(s): LIPASE, AMYLASE in the last 168 hours. No results for input(s): AMMONIA in the last 168 hours. Coagulation Profile: Recent Labs  Lab 06/01/21 2102  INR 1.1   Cardiac Enzymes: No results for input(s): CKTOTAL, CKMB, CKMBINDEX, TROPONINI in the last 168 hours. BNP (last 3 results) No results for input(s): PROBNP in the last 8760 hours. HbA1C: No results for input(s): HGBA1C in the last 72 hours.  CBG: Recent Labs  Lab 06/01/21 2136  GLUCAP 146*   Lipid Profile: No results for input(s): CHOL, HDL, LDLCALC, TRIG, CHOLHDL, LDLDIRECT in the last 72 hours.  Thyroid Function Tests: No results for input(s): TSH, T4TOTAL, FREET4, T3FREE, THYROIDAB in the last 72 hours.  Anemia Panel: No results for input(s): VITAMINB12, FOLATE, FERRITIN, TIBC, IRON, RETICCTPCT in the last 72 hours. Sepsis Labs: No results for input(s): PROCALCITON, LATICACIDVEN in the last 168 hours.  Recent Results (from the past 240 hour(s))  Resp Panel by RT-PCR (Flu A&B, Covid) Nasopharyngeal Swab     Status: None   Collection Time: 06/01/21  9:40 PM   Specimen: Nasopharyngeal Swab; Nasopharyngeal(NP) swabs in vial transport medium  Result Value Ref Range Status   SARS Coronavirus 2 by RT PCR NEGATIVE NEGATIVE Final    Comment: (NOTE) SARS-CoV-2 target nucleic acids are NOT DETECTED.  The SARS-CoV-2 RNA is generally detectable in upper respiratory specimens during the acute phase of infection. The lowest concentration of SARS-CoV-2 viral copies this assay can detect is 138  copies/mL. A negative result does not preclude SARS-Cov-2 infection and should not be used as the sole basis for treatment or other patient management decisions. A negative result may occur with  improper specimen collection/handling, submission of specimen other than nasopharyngeal swab, presence of viral mutation(s) within the areas targeted by this assay, and inadequate number of viral copies(<138 copies/mL). A negative result must be combined with clinical observations, patient history, and epidemiological information. The expected result is Negative.  Fact Sheet for Patients:  EntrepreneurPulse.com.au  Fact Sheet for Healthcare Providers:  IncredibleEmployment.be  This test is no t yet approved or cleared by the [place] FDA and  has been authorized for detection and/or diagnosis of SARS-CoV-2 by FDA under an Emergency Use Authorization (EUA). This EUA will remain  in effect (meaning this test can be used) for  the duration of the COVID-19 declaration under Section 564(b)(1) of the Act, 21 U.S.C.section 360bbb-3(b)(1), unless the authorization is terminated  or revoked sooner.       Influenza A by PCR NEGATIVE NEGATIVE Final   Influenza B by PCR NEGATIVE NEGATIVE Final    Comment: (NOTE) The Xpert Xpress SARS-CoV-2/FLU/RSV plus assay is intended as an aid in the diagnosis of influenza from Nasopharyngeal swab specimens and should not be used as a sole basis for treatment. Nasal washings and aspirates are unacceptable for Xpert Xpress SARS-CoV-2/FLU/RSV testing.  Fact Sheet for Patients: EntrepreneurPulse.com.au  Fact Sheet for Healthcare Providers: IncredibleEmployment.be  This test is not yet approved or cleared by the [place] FDA and has been authorized for detection and/or diagnosis of SARS-CoV-2 by FDA under an Emergency Use Authorization (EUA). This EUA will remain in effect (meaning  this test can be used) for the duration of the COVID-19 declaration under Section 564(b)(1) of the Act, 21 U.S.C. section 360bbb-3(b)(1), unless the authorization is terminated or revoked.  Performed at [facility], [place] [address], [place], [place] [address]       Radiology Studies: ECHOCARDIOGRAM COMPLETE  Result Date: 06/03/2021    ECHOCARDIOGRAM REPORT   Patient Name:   Allen Torres Date of Exam: 06/03/2021 Medical Rec #:  [id number]     Height:       71.0 in Accession #:    [id number]    Weight:       240.0 lb Date of Birth:  May 20, 1940    BSA:          2.278 m Patient Age:    81 years      BP:           122/52 mmHg Patient Gender: M             HR:           70 bpm. Exam Location:  Inpatient Procedure: 2D Echo, Cardiac Doppler and Color Doppler Indications:    Stroke I63.9  History:        Patient has no prior history of Echocardiogram examinations.                 Risk Factors:Hypertension.  Sonographer:    Bernadene Person RDCS Referring Phys: [place]  1. Left ventricular ejection fraction, by estimation, is 60 to 65%. The left ventricle has normal function. Left ventricular endocardial border not optimally defined to evaluate regional wall motion. 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 Left ventricular diastolic parameters are consistent with Grade I diastolic dysfunction (impaired relaxation). Elevated left atrial pressure.  2. Right ventricular systolic function is normal. The right ventricular size is normal. There is normal pulmonary artery systolic pressure.  3. The mitral valve is normal in structure. No evidence of mitral valve regurgitation. No evidence of mitral stenosis.  4. The aortic valve is tricuspid. Aortic valve regurgitation is not visualized. Mild to moderate aortic valve stenosis. Aortic valve mean gradient measures 12.0 mmHg. Aortic valve peak gradient measures 21.3 mmHg. Aortic valve area, by VTI  measures 1.08  cm.     Lower than expected gradient due to decreased SVI  5. Aortic dilatation noted. There is mild dilatation of the aortic root, measuring 41 mm. 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 FINDINGS  Left Ventricle: Left ventricular ejection fraction, by estimation, is 60 to 65%. The left ventricle has normal function.  Left ventricular endocardial border not optimally defined to evaluate regional wall motion. The left ventricular internal cavity size was normal in size. 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 Left ventricular diastolic parameters are consistent with Grade I diastolic dysfunction (impaired relaxation). Elevated left atrial pressure. Right Ventricle: The right ventricular size is normal. Right vetricular wall thickness was not well visualized. Right ventricular systolic function is normal. There is normal pulmonary artery systolic pressure. The tricuspid regurgitant velocity is 2.29 m/s, and with an assumed right atrial pressure of 8 mmHg, the estimated right ventricular systolic pressure is 29.0 mmHg. Left Atrium: Left atrial size was normal in size. Right Atrium: Right atrial size was normal in size. Pericardium: There is no evidence of pericardial effusion. Mitral Valve: The mitral valve is normal in structure. There is mild thickening of the mitral valve leaflet(s). There is mild calcification of the mitral valve leaflet(s). Mild mitral annular calcification. No evidence of mitral valve regurgitation. No evidence of mitral valve stenosis. Tricuspid Valve: The tricuspid valve is not well visualized. Tricuspid valve regurgitation is mild . No evidence of tricuspid stenosis. Aortic Valve: Lower than expected gradient due to decreased SVI. The aortic valve is tricuspid. Aortic valve regurgitation is not visualized. Mild to moderate aortic stenosis is present. Aortic valve mean gradient measures 12.0 mmHg.  Aortic valve peak gradient measures 21.3 mmHg. Aortic valve area, by VTI measures 1.08 cm. Pulmonic Valve: The pulmonic valve was not well visualized. Pulmonic valve regurgitation is mild. No evidence of pulmonic stenosis. Aorta: Aortic dilatation noted. There is mild dilatation of the aortic root, measuring 41 mm. 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 Venous: The inferior vena cava was not well visualized. IAS/Shunts: The interatrial septum was not well visualized.  LEFT VENTRICLE PLAX 2D LVIDd:         4.80 cm      Diastology LVIDs:         2.90 cm      LV e' medial:    3.24 cm/s LV PW:         1.40 cm      LV E/e' medial:  18.3 LV IVS:        1.90 cm      LV e' lateral:   4.91 cm/s LVOT diam:     2.10 cm      LV E/e' lateral: 12.1 LV SV:         51 LV SV Index:   22 LVOT Area:     3.46 cm  LV Volumes (MOD) LV vol d, MOD A2C: 89.3 ml LV vol d, MOD A4C: 103.0 ml LV vol s, MOD A2C: 54.8 ml LV vol s, MOD A4C: 60.4 ml LV SV MOD A2C:     34.5 ml LV SV MOD A4C:     103.0 ml LV SV MOD BP:      41.9 ml RIGHT VENTRICLE RV S prime:     7.10 cm/s TAPSE (M-mode): 1.5 cm LEFT ATRIUM             Index        RIGHT ATRIUM           Index LA diam:        3.90 cm 1.71 cm/m   RA Area:     13.20 cm LA Vol (A2C):   44.6 ml 19.57 ml/m  RA Volume:   27.50 ml  12.07 ml/m LA Vol (A4C):  62.3 ml 27.34 ml/m LA Biplane Vol: 53.4 ml 23.44 ml/m  AORTIC VALVE AV Area (Vmax):    1.02 cm AV Area (Vmean):   1.02 cm AV Area (VTI):     1.08 cm AV Vmax:           230.67 cm/s AV Vmean:          161.000 cm/s AV VTI:            0.470 m AV Peak Grad:      21.3 mmHg AV Mean Grad:      12.0 mmHg LVOT Vmax:         68.17 cm/s LVOT Vmean:        47.200 cm/s LVOT VTI:          0.146 m LVOT/AV VTI ratio: 0.31  AORTA Ao Root diam: 4.10 cm Ao Asc diam:  4.90 cm MITRAL VALVE                TRICUSPID VALVE MV Area (PHT): 3.34 cm     TR Peak grad:   21.0 mmHg MV Decel Time: 227 msec     TR Vmax:        229.00  cm/s MV E velocity: 59.40 cm/s MV A velocity: 124.00 cm/s  SHUNTS MV E/A ratio:  0.48         Systemic VTI:  0.15 m                             Systemic Diam: 2.10 cm Carlyle Dolly MD Electronically signed by Carlyle Dolly MD Signature Date/Time: 06/03/2021/5:34:23 PM    Final     Scheduled Meds:   stroke: mapping our early stages of recovery book   Does not apply Once   aspirin EC  81 mg Oral Daily   clopidogrel  75 mg Oral Daily   doxazosin  1 mg Oral Q12H   heparin  5,000 Units Subcutaneous Q8H   Continuous Infusions:   LOS: 3 days   Time spent: 35 minutes   Nolberto Hanlon, MD [facility]  If 7PM-7AM, please contact night-coverage [email or web address] 06/05/2021, 1:20 PM

## 2021-06-05 NOTE — Care Management Important Message (Signed)
Important Message  Patient Details  Name: Allen Torres MRN: [id number] Date of Birth: 04-28-1940   Medicare Important Message Given:  Yes     Christopherjohn Schiele 06/05/2021, 1:54 PM

## 2021-06-05 NOTE — Progress Notes (Signed)
Speech Language Pathology Treatment: Dysphagia  Patient Details Name: Allen Torres MRN: [id number] DOB: November 03, 1940 Today's Date: 06/05/2021 Time: 1157-2620 SLP Time Calculation (min) (ACUTE ONLY): 17 min  Assessment / Plan / Recommendation Clinical Impression  Pt was seen for f/u dysphagia tx with RN reporting intermittent coughing with PO intake, particularly when pt is more distracted. Pt also shares that he is very eager to get food that is not cut up as finely. SLP provided advanced trials of regular solids alongside thin liquids. He does need cues to attend to oral preparation, as he is distractible and wants to talk while eating. Pt also acknowledges that sometimes he realizes he has food in his mouth and he is "not doing anything with it." SLP provided education about attention and cognition as it pertains to swallowing. Pt had small amounts of oral residue that he is capable of clearing when he is attentive. Coughing was noted only once when trying to use thin liquids to clear his oral cavity, but son present also shares that pt has been coughing during meals. Recommend proceeding with MBS prior to making any diet changes. Per radiology, this can be scheduled on next date at the earliest. In the mean time, will leave Dys 2 diet and thin liquids in place with full supervision during meals for use of aspiration precautions.    HPI HPI: Pt is an 81 yo male presenting wtih L facial droop and slurred speech. MRI showed small area of acute/subacute ischemia in the R centrum semiovale. PMH includes: HTN, renal disorder      SLP Plan  MBS      Recommendations for follow up therapy are one component of a multi-disciplinary discharge planning process, led by the attending physician.  Recommendations may be updated based on patient status, additional functional criteria and insurance authorization.    Recommendations  Diet recommendations: Dysphagia 2 (fine chop);Thin liquid Liquids provided  via: Cup;Straw Medication Administration: Whole meds with puree Supervision: Patient able to self feed;Full supervision/cueing for compensatory strategies Compensations: Slow rate;Small sips/bites;Minimize environmental distractions Postural Changes and/or Swallow Maneuvers: Seated upright 90 degrees                Oral Care Recommendations: Oral care BID Follow Up Recommendations: Acute inpatient rehab (3hours/day) Assistance recommended at discharge: Intermittent Supervision/Assistance SLP Visit Diagnosis: Dysphagia, unspecified (R13.10) Plan: MBS           Mahala Menghini., M.A. CCC-SLP Acute Rehabilitation Services Pager [phone] Office [phone]  06/05/2021, 1:34 PM

## 2021-06-05 NOTE — Progress Notes (Signed)
Inpatient Rehab Admissions Coordinator:   I do not have insurance auth or a bed for this Pt. On CIR today. I will continue to follow for potential admit pending insurance auth and bed availability.  Analise Glotfelty, MS, CCC-SLP Rehab Admissions Coordinator  [phone] (celll) [phone] (office)  

## 2021-06-05 NOTE — Progress Notes (Signed)
Occupational Therapy Treatment Patient Details Name: Allen Torres MRN: [id number] DOB: 05-Jul-1940 Today's Date: 06/05/2021   History of present illness Patient is an 81 year old male presenting to ED with left facial droop and speech. MRI reading Small area of acute/subacute ischemia in the right centrum  semiovale. PMH: HTN   OT comments  Patient received in recliner and agreeable to OT treatment. Patient ambulated to sink with RW and verbal cues to attend to left. Patient attempted to turn water on with LUE and required min assist.  Patient performed toilet transfer and ambulated to recliner with min assist due to attention to left. Patient instructed on AAROM exercises for LUE shoulder and AROM for LUE elbow and hand. Acute OT to continue to follow.    Recommendations for follow up therapy are one component of a multi-disciplinary discharge planning process, led by the attending physician.  Recommendations may be updated based on patient status, additional functional criteria and insurance authorization.    Follow Up Recommendations  Acute inpatient rehab (3hours/day)    Assistance Recommended at Discharge Frequent or constant Supervision/Assistance  Patient can return home with the following  A little help with walking and/or transfers;A lot of help with bathing/dressing/bathroom;Assistance with cooking/housework;Help with stairs or ramp for entrance;Assist for transportation;Direct supervision/assist for medications management;Direct supervision/assist for financial management   Equipment Recommendations  Tub/shower seat    Recommendations for Other Services      Precautions / Restrictions Precautions Precautions: Fall Precaution Comments: left side weakness Restrictions Weight Bearing Restrictions: No       Mobility Bed Mobility Overal bed mobility: Needs Assistance             General bed mobility comments: seated in recliner    Transfers Overall transfer level:  Needs assistance Equipment used: Rolling walker (2 wheels) Transfers: Sit to/from Stand Sit to Stand: Min assist           General transfer comment: min assist for transfer to toilet and recliner     Balance Overall balance assessment: Needs assistance Sitting-balance support: Feet supported Sitting balance-Leahy Scale: Fair Sitting balance - Comments: seated in recliner   Standing balance support: Bilateral upper extremity supported, Single extremity supported, During functional activity Standing balance-Leahy Scale: Poor Standing balance comment: stood at sink for grooming tasks with min guard due to left lateral leaning                           ADL either performed or assessed with clinical judgement   ADL Overall ADL's : Needs assistance/impaired     Grooming: Wash/dry hands;Minimal assistance;Wash/dry face;Standing Grooming Details (indicate cue type and reason): Patient stood at sink and required assistance with turning on hot water with LUE                 Toilet Transfer: Minimal assistance;Ambulation;Rolling walker (2 wheels) Toilet Transfer Details (indicate cue type and reason): min assist due to assistance maneuvering RW in bathroom           General ADL Comments: Patient attempted to use LUE to assist with turning on water and washing face    Extremity/Trunk Assessment Upper Extremity Assessment RUE Deficits / Details: AROM grossly intact biceps 4-/5 triceps 4/5 grip 4+/5 RUE Coordination: WNL LUE Deficits / Details: AROM grossly intact, biceps 3+/5, triceps 4-/5, grip 4/5 LUE Coordination: decreased fine motor            Vision   Eye Alignment:  Within Functional Limits Visual Fields: Impaired-to be further tested in functional context   Perception     Praxis      Cognition Arousal/Alertness: Awake/alert Behavior During Therapy: WFL for tasks assessed/performed Overall Cognitive Status: Impaired/Different from  baseline Area of Impairment: Problem solving, Following commands, Memory, Attention                   Current Attention Level: Sustained Memory: Decreased short-term memory Following Commands: Follows one step commands with increased time     Problem Solving: Difficulty sequencing, Slow processing General Comments: rquired verbal cues to attend to left when ambulating in room        Exercises Exercises: General Upper Extremity General Exercises - Upper Extremity Shoulder Flexion: AAROM, Left, 10 reps, Seated Elbow Flexion: AROM, Left, 10 reps, Seated Elbow Extension: AROM, Left, 10 reps, Seated Digit Composite Flexion: AROM, Left, 10 reps, Seated    Shoulder Instructions       General Comments VSS    Pertinent Vitals/ Pain       Pain Assessment Pain Assessment: No/denies pain Faces Pain Scale: No hurt Pain Intervention(s): Monitored during session  Home Living                                          Prior Functioning/Environment              Frequency  Min 2X/week        Progress Toward Goals  OT Goals(current goals can now be found in the care plan section)  Progress towards OT goals: Progressing toward goals  Acute Rehab OT Goals Patient Stated Goal: get better OT Goal Formulation: With patient Time For Goal Achievement: 06/16/21 Potential to Achieve Goals: Good ADL Goals Pt Will Perform Lower Body Dressing: with supervision;sit to/from stand;sitting/lateral leans Pt Will Transfer to Toilet: with supervision;ambulating;regular height toilet Pt Will Perform Toileting - Clothing Manipulation and hygiene: with supervision;sit to/from stand;sitting/lateral leans  Plan Discharge plan remains appropriate    Co-evaluation                 AM-PAC OT "6 Clicks" Daily Activity     Outcome Measure   Help from another person eating meals?: A Little Help from another person taking care of personal grooming?: A Little Help  from another person toileting, which includes using toliet, bedpan, or urinal?: A Lot Help from another person bathing (including washing, rinsing, drying)?: A Lot Help from another person to put on and taking off regular upper body clothing?: A Little Help from another person to put on and taking off regular lower body clothing?: A Lot 6 Click Score: 15    End of Session Equipment Utilized During Treatment: Gait belt;Rolling walker (2 wheels)  OT Visit Diagnosis: Unsteadiness on feet (R26.81);Other abnormalities of gait and mobility (R26.89);Muscle weakness (generalized) (M62.81);Other symptoms and signs involving cognitive function;Other symptoms and signs involving the nervous system (R29.898)   Activity Tolerance Patient tolerated treatment well   Patient Left in chair;with call bell/phone within reach;with chair alarm set;with family/visitor present   Nurse Communication Mobility status        Time: [id number] OT Time Calculation (min): 23 min  Charges: OT General Charges $OT Visit: 1 Visit OT Treatments $Self Care/Home Management : 8-22 mins $Therapeutic Exercise: 8-22 mins  Alfonse Flavors, OTA Acute Rehabilitation Services  Pager [phone] Office [phone]   Railynn Ballo L  Cornelius Moras 06/05/2021, 1:16 PM

## 2021-06-05 NOTE — Progress Notes (Signed)
Physical Therapy Treatment Patient Details Name: Allen Torres MRN: [id number] DOB: Mar 14, 1941 Today's Date: 06/05/2021   History of Present Illness Patient is an 81 year old male presenting to ED with left facial droop and speech. MRI reading Small area of acute/subacute ischemia in the right centrum  semiovale. PMH: HTN    PT Comments    Pt progressing well. Pt remains mildly confused and emotional with some L sided neglect. Pt with improved transfer ability but continues to require max verbal cues for sequencing and safety. Pt continues with L lateral listing in standing and vears to the L when ambulating. Continue to recommend CIR upon d/c. Acute PT to cont to follow.    Recommendations for follow up therapy are one component of a multi-disciplinary discharge planning process, led by the attending physician.  Recommendations may be updated based on patient status, additional functional criteria and insurance authorization.  Follow Up Recommendations  Acute inpatient rehab (3hours/day)     Assistance Recommended at Discharge Frequent or constant Supervision/Assistance  Patient can return home with the following A lot of help with walking and/or transfers;A lot of help with bathing/dressing/bathroom;Assistance with cooking/housework;Assist for transportation;Help with stairs or ramp for entrance;Assistance with feeding   Equipment Recommendations  Rolling walker (2 wheels)    Recommendations for Other Services       Precautions / Restrictions Precautions Precautions: Fall Precaution Comments: left side weakness Restrictions Weight Bearing Restrictions: No     Mobility  Bed Mobility Overal bed mobility: Needs Assistance Bed Mobility: Supine to Sit     Supine to sit: HOB elevated, Min assist     General bed mobility comments: step by step verbal cues to sequencing task, pt with could ability to bring trunk up into long sit    Transfers Overall transfer level: Needs  assistance Equipment used: Rolling walker (2 wheels) Transfers: Sit to/from Stand Sit to Stand: Min assist           General transfer comment: verbal and tactile cues for safe hand placement    Ambulation/Gait Ambulation/Gait assistance: Mod assist, Min assist Gait Distance (Feet): 150 Feet Assistive device: Rolling walker (2 wheels) Gait Pattern/deviations: Step-through pattern, Staggering left, Drifts right/left Gait velocity: decr Gait velocity interpretation: <1.31 ft/sec, indicative of household ambulator   General Gait Details: despite max verbal cues to stay in middle of walker pt staying to left side of walker near catching L foot on walker, pt with improved ability to maintain L hand gripped on walker t/o amb. minA for walker management   Stairs             Wheelchair Mobility    Modified Rankin (Stroke Patients Only) Modified Rankin (Stroke Patients Only) Pre-Morbid Rankin Score: Slight disability Modified Rankin: Moderately severe disability     Balance Overall balance assessment: Needs assistance Sitting-balance support: Feet supported Sitting balance-Leahy Scale: Fair Sitting balance - Comments: pt able to don slippers on at EOB, using only R hand, didn't loose balance   Standing balance support: Bilateral upper extremity supported, During functional activity, Reliant on assistive device for balance Standing balance-Leahy Scale: Poor Standing balance comment: lateral lean to left                            Cognition Arousal/Alertness: Awake/alert Behavior During Therapy: WFL for tasks assessed/performed Overall Cognitive Status: Impaired/Different from baseline Area of Impairment: Problem solving, Following commands, Memory, Attention  Current Attention Level: Sustained Memory: Decreased short-term memory Following Commands: Follows one step commands with increased time     Problem Solving: Difficulty  sequencing, Slow processing General Comments: pt with some L sided inattention, difficulty staying on task however cooperative and able to follow simple commands majority of time        Exercises      General Comments General comments (skin integrity, edema, etc.): VSS      Pertinent Vitals/Pain Pain Assessment Pain Assessment: No/denies pain Faces Pain Scale: No hurt    Home Living                          Prior Function            PT Goals (current goals can now be found in the care plan section) Acute Rehab PT Goals Patient Stated Goal: to go home soon PT Goal Formulation: With patient Time For Goal Achievement: 06/16/21 Potential to Achieve Goals: Good Progress towards PT goals: Progressing toward goals    Frequency    Min 4X/week      PT Plan Current plan remains appropriate    Co-evaluation              AM-PAC PT "6 Clicks" Mobility   Outcome Measure  Help needed turning from your back to your side while in a flat bed without using bedrails?: A Little Help needed moving from lying on your back to sitting on the side of a flat bed without using bedrails?: A Little Help needed moving to and from a bed to a chair (including a wheelchair)?: A Lot Help needed standing up from a chair using your arms (e.g., wheelchair or bedside chair)?: A Lot Help needed to walk in hospital room?: A Lot Help needed climbing 3-5 steps with a railing? : A Lot 6 Click Score: 14    End of Session Equipment Utilized During Treatment: Gait belt Activity Tolerance: Patient tolerated treatment well Patient left: with call bell/phone within reach;in chair;with chair alarm set;with family/visitor present Nurse Communication: Mobility status PT Visit Diagnosis: Unsteadiness on feet (R26.81);Muscle weakness (generalized) (M62.81);Difficulty in walking, not elsewhere classified (R26.2);Other symptoms and signs involving the nervous system (R29.898)     Time:  [id number] PT Time Calculation (min) (ACUTE ONLY): 22 min  Charges:  $Gait Training: 8-22 mins                     Kittie Plater, PT, DPT Acute Rehabilitation Services Pager #: [phone] Office #: [phone]    Berline Lopes 06/05/2021, 12:49 PM

## 2021-06-06 ENCOUNTER — Encounter (HOSPITAL_COMMUNITY): Payer: Self-pay | Admitting: Internal Medicine

## 2021-06-06 ENCOUNTER — Other Ambulatory Visit: Payer: Self-pay

## 2021-06-06 ENCOUNTER — Encounter (HOSPITAL_COMMUNITY): Payer: Self-pay | Admitting: Physical Medicine and Rehabilitation

## 2021-06-06 ENCOUNTER — Inpatient Hospital Stay (HOSPITAL_COMMUNITY): Payer: Medicare HMO

## 2021-06-06 ENCOUNTER — Inpatient Hospital Stay (HOSPITAL_COMMUNITY)
Admission: RE | Admit: 2021-06-06 | Discharge: 2021-06-14 | DRG: 057 | Disposition: A | Discharge status: Home Health | Payer: Medicare HMO | Source: Intra-hospital | Attending: Physical Medicine and Rehabilitation | Admitting: Physical Medicine and Rehabilitation

## 2021-06-06 DIAGNOSIS — Z7902 Long term (current) use of antithrombotics/antiplatelets: Secondary | ICD-10-CM

## 2021-06-06 DIAGNOSIS — I69392 Facial weakness following cerebral infarction: Secondary | ICD-10-CM | POA: Diagnosis not present

## 2021-06-06 DIAGNOSIS — R4586 Emotional lability: Secondary | ICD-10-CM | POA: Diagnosis present

## 2021-06-06 DIAGNOSIS — N401 Enlarged prostate with lower urinary tract symptoms: Secondary | ICD-10-CM | POA: Diagnosis present

## 2021-06-06 DIAGNOSIS — Z87891 Personal history of nicotine dependence: Secondary | ICD-10-CM | POA: Diagnosis not present

## 2021-06-06 DIAGNOSIS — I69328 Other speech and language deficits following cerebral infarction: Secondary | ICD-10-CM

## 2021-06-06 DIAGNOSIS — Z823 Family history of stroke: Secondary | ICD-10-CM | POA: Diagnosis not present

## 2021-06-06 DIAGNOSIS — Z6829 Body mass index (BMI) 29.0-29.9, adult: Secondary | ICD-10-CM

## 2021-06-06 DIAGNOSIS — Z79899 Other long term (current) drug therapy: Secondary | ICD-10-CM | POA: Diagnosis not present

## 2021-06-06 DIAGNOSIS — E871 Hypo-osmolality and hyponatremia: Secondary | ICD-10-CM | POA: Diagnosis present

## 2021-06-06 DIAGNOSIS — M109 Gout, unspecified: Secondary | ICD-10-CM | POA: Diagnosis present

## 2021-06-06 DIAGNOSIS — I129 Hypertensive chronic kidney disease with stage 1 through stage 4 chronic kidney disease, or unspecified chronic kidney disease: Secondary | ICD-10-CM | POA: Diagnosis present

## 2021-06-06 DIAGNOSIS — I69334 Monoplegia of upper limb following cerebral infarction affecting left non-dominant side: Principal | ICD-10-CM

## 2021-06-06 DIAGNOSIS — E785 Hyperlipidemia, unspecified: Secondary | ICD-10-CM | POA: Diagnosis present

## 2021-06-06 DIAGNOSIS — Z888 Allergy status to other drugs, medicaments and biological substances status: Secondary | ICD-10-CM

## 2021-06-06 DIAGNOSIS — Z66 Do not resuscitate: Secondary | ICD-10-CM | POA: Diagnosis present

## 2021-06-06 DIAGNOSIS — N1831 Chronic kidney disease, stage 3a: Secondary | ICD-10-CM

## 2021-06-06 DIAGNOSIS — R471 Dysarthria and anarthria: Secondary | ICD-10-CM | POA: Diagnosis present

## 2021-06-06 DIAGNOSIS — Z832 Family history of diseases of the blood and blood-forming organs and certain disorders involving the immune mechanism: Secondary | ICD-10-CM

## 2021-06-06 DIAGNOSIS — R338 Other retention of urine: Secondary | ICD-10-CM | POA: Diagnosis present

## 2021-06-06 DIAGNOSIS — R682 Dry mouth, unspecified: Secondary | ICD-10-CM | POA: Diagnosis present

## 2021-06-06 DIAGNOSIS — E663 Overweight: Secondary | ICD-10-CM | POA: Diagnosis present

## 2021-06-06 DIAGNOSIS — K5900 Constipation, unspecified: Secondary | ICD-10-CM | POA: Diagnosis present

## 2021-06-06 DIAGNOSIS — Z8673 Personal history of transient ischemic attack (TIA), and cerebral infarction without residual deficits: Secondary | ICD-10-CM | POA: Diagnosis present

## 2021-06-06 DIAGNOSIS — I639 Cerebral infarction, unspecified: Secondary | ICD-10-CM | POA: Diagnosis present

## 2021-06-06 DIAGNOSIS — I10 Essential (primary) hypertension: Secondary | ICD-10-CM | POA: Diagnosis not present

## 2021-06-06 DIAGNOSIS — Z7982 Long term (current) use of aspirin: Secondary | ICD-10-CM | POA: Diagnosis not present

## 2021-06-06 MED ORDER — ASPIRIN EC 81 MG PO TBEC
81.0000 mg | DELAYED_RELEASE_TABLET | Freq: Every day | ORAL | Status: DC
  Administered 2021-06-07 – 2021-06-14 (×8): 81 mg via ORAL
  Filled 2021-06-06 (×8): qty 1

## 2021-06-06 MED ORDER — ATENOLOL 25 MG PO TABS
25.0000 mg | ORAL_TABLET | Freq: Every day | ORAL | Status: DC
  Administered 2021-06-06: 25 mg via ORAL
  Filled 2021-06-06: qty 1

## 2021-06-06 MED ORDER — ATENOLOL 25 MG PO TABS
12.5000 mg | ORAL_TABLET | Freq: Every day | ORAL | Status: DC
Start: 2021-06-06 — End: 2021-06-06

## 2021-06-06 MED ORDER — BISACODYL 10 MG RE SUPP: 10.0000 mg | Freq: Every day | RECTAL | 0 refills | Status: DC | PRN

## 2021-06-06 MED ORDER — ACETAMINOPHEN 325 MG PO TABS: 650.0000 mg | ORAL_TABLET | ORAL | Status: DC | PRN

## 2021-06-06 MED ORDER — ATENOLOL 25 MG PO TABS: 25.0000 mg | ORAL_TABLET | Freq: Every day | ORAL | Status: DC

## 2021-06-06 MED ORDER — VITAMIN B-12 100 MCG PO TABS
100.0000 ug | ORAL_TABLET | Freq: Every day | ORAL | Status: DC
  Administered 2021-06-06 – 2021-06-08 (×3): 100 ug via ORAL
  Filled 2021-06-06 (×3): qty 1

## 2021-06-06 MED ORDER — CLOPIDOGREL BISULFATE 75 MG PO TABS
75.0000 mg | ORAL_TABLET | Freq: Every day | ORAL | 0 refills | Status: DC
Start: 2021-06-07 — End: 2021-06-13

## 2021-06-06 MED ORDER — CLOPIDOGREL BISULFATE 75 MG PO TABS
75.0000 mg | ORAL_TABLET | Freq: Every day | ORAL | Status: DC
Start: 2021-06-07 — End: 2021-06-14
  Administered 2021-06-07 – 2021-06-14 (×8): 75 mg via ORAL
  Filled 2021-06-06 (×8): qty 1

## 2021-06-06 MED ORDER — MECLIZINE HCL 12.5 MG PO TABS: 12.5000 mg | ORAL_TABLET | Freq: Three times a day (TID) | ORAL | 0 refills | Status: DC | PRN

## 2021-06-06 MED ORDER — DOXAZOSIN MESYLATE 1 MG PO TABS
1.0000 mg | ORAL_TABLET | Freq: Two times a day (BID) | ORAL | Status: DC
  Administered 2021-06-06 – 2021-06-14 (×16): 1 mg via ORAL
  Filled 2021-06-06 (×16): qty 1

## 2021-06-06 MED ORDER — VITAMIN D 25 MCG (1000 UNIT) PO TABS
1000.0000 [IU] | ORAL_TABLET | Freq: Every day | ORAL | Status: DC
  Administered 2021-06-06 – 2021-06-14 (×9): 1000 [IU] via ORAL
  Filled 2021-06-06 (×9): qty 1

## 2021-06-06 MED ORDER — FLUTICASONE PROPIONATE 50 MCG/ACT NA SUSP
1.0000 | Freq: Every day | NASAL | Status: DC | PRN
  Administered 2021-06-08: 1 via NASAL
  Filled 2021-06-06: qty 16

## 2021-06-06 MED ORDER — ATENOLOL 25 MG PO TABS
25.0000 mg | ORAL_TABLET | Freq: Every day | ORAL | Status: DC
  Administered 2021-06-07 – 2021-06-14 (×8): 25 mg via ORAL
  Filled 2021-06-06 (×8): qty 1

## 2021-06-06 MED ORDER — DOXAZOSIN MESYLATE 1 MG PO TABS: 1.0000 mg | ORAL_TABLET | Freq: Two times a day (BID) | ORAL | Status: DC

## 2021-06-06 MED ORDER — ASPIRIN 81 MG PO TBEC: 81.0000 mg | DELAYED_RELEASE_TABLET | Freq: Every day | ORAL | 11 refills | Status: DC

## 2021-06-06 MED ORDER — SENNOSIDES-DOCUSATE SODIUM 8.6-50 MG PO TABS: 1.0000 | ORAL_TABLET | Freq: Every evening | ORAL | Status: DC | PRN

## 2021-06-06 NOTE — Progress Notes (Signed)
Physical Therapy Treatment Patient Details Name: Allen Torres MRN: [id number] DOB: April 07, 1941 Today's Date: 06/06/2021   History of Present Illness Patient is an 81 year old male presenting to ED with left facial droop and speech. MRI reading Small area of acute/subacute ischemia in the right centrum  semiovale. PMH: HTN    PT Comments    Pt progressing well towards all goals. Pt continues to be emotional and have dysarthric speech. Pt remains to require RW for safe mobility. Pt continues to demo impaired memory, problem solving, and decreased insight to deficits and safety. Continue to recommend AIR upon d/c for maximal functional recovery to return home with daughter. Acute PT to cont to follow.    Recommendations for follow up therapy are one component of a multi-disciplinary discharge planning process, led by the attending physician.  Recommendations may be updated based on patient status, additional functional criteria and insurance authorization.  Follow Up Recommendations  Acute inpatient rehab (3hours/day)     Assistance Recommended at Discharge Frequent or constant Supervision/Assistance  Patient can return home with the following A little help with walking and/or transfers;A little help with bathing/dressing/bathroom;Assistance with cooking/housework;Direct supervision/assist for financial management;Direct supervision/assist for medications management;Assist for transportation;Help with stairs or ramp for entrance   Equipment Recommendations  Rolling walker (2 wheels)    Recommendations for Other Services       Precautions / Restrictions Precautions Precautions: Fall Precaution Comments: left side weakness Restrictions Weight Bearing Restrictions: No     Mobility  Bed Mobility Overal bed mobility: Needs Assistance Bed Mobility: Supine to Sit     Supine to sit: HOB elevated, Min assist     General bed mobility comments: verbal cues to use L UE, HOB elevated,  increased time    Transfers Overall transfer level: Needs assistance Equipment used: Rolling walker (2 wheels) Transfers: Sit to/from Stand Sit to Stand: Min assist           General transfer comment: minA to power up, verbal cues for safe hand placement, no carry over despite 3 trials of sit to stand    Ambulation/Gait Ambulation/Gait assistance: Min assist Gait Distance (Feet): 160 Feet Assistive device: Rolling walker (2 wheels) Gait Pattern/deviations: Step-through pattern, Decreased stride length Gait velocity: decr Gait velocity interpretation: <1.31 ft/sec, indicative of household ambulator   General Gait Details: pt with improved ability to stay in middle of walker versus L side of walker. With onset of fatigue pt would begin to have difficulty clearing L foot requiring verbal cues to continue to lift foot and not slid it across the floor   Stairs             Wheelchair Mobility    Modified Rankin (Stroke Patients Only) Modified Rankin (Stroke Patients Only) Pre-Morbid Rankin Score: Slight disability Modified Rankin: Moderately severe disability     Balance Overall balance assessment: Needs assistance Sitting-balance support: Feet supported Sitting balance-Leahy Scale: Fair Sitting balance - Comments: pt without LOB when trying to don slippers at EOB   Standing balance support: Bilateral upper extremity supported, Single extremity supported, During functional activity Standing balance-Leahy Scale: Poor Standing balance comment: pt requiring RW for safe amb                            Cognition Arousal/Alertness: Awake/alert Behavior During Therapy: WFL for tasks assessed/performed Overall Cognitive Status: Impaired/Different from baseline Area of Impairment: Problem solving, Following commands, Memory, Attention  Current Attention Level: Sustained Memory: Decreased short-term memory Following Commands: Follows  one step commands with increased time     Problem Solving: Difficulty sequencing, Slow processing General Comments: pt with improved spatial awareness today. difficulty problem solving on how to get shoes on when L hand wasn't working        Exercises      General Comments General comments (skin integrity, edema, etc.): VSS      Pertinent Vitals/Pain Pain Assessment Pain Assessment: No/denies pain    Home Living                          Prior Function            PT Goals (current goals can now be found in the care plan section) Progress towards PT goals: Progressing toward goals    Frequency    Min 4X/week      PT Plan Current plan remains appropriate    Co-evaluation              AM-PAC PT "6 Clicks" Mobility   Outcome Measure  Help needed turning from your back to your side while in a flat bed without using bedrails?: A Little Help needed moving from lying on your back to sitting on the side of a flat bed without using bedrails?: A Little Help needed moving to and from a bed to a chair (including a wheelchair)?: A Lot Help needed standing up from a chair using your arms (e.g., wheelchair or bedside chair)?: A Lot Help needed to walk in hospital room?: A Lot Help needed climbing 3-5 steps with a railing? : A Lot 6 Click Score: 14    End of Session Equipment Utilized During Treatment: Gait belt Activity Tolerance: Patient tolerated treatment well Patient left: in chair;with call bell/phone within reach;with chair alarm set Nurse Communication: Mobility status PT Visit Diagnosis: Unsteadiness on feet (R26.81);Muscle weakness (generalized) (M62.81);Difficulty in walking, not elsewhere classified (R26.2);Other symptoms and signs involving the nervous system (R29.898)     Time: [id number] PT Time Calculation (min) (ACUTE ONLY): 17 min  Charges:  $Gait Training: 8-22 mins                     Kittie Plater, PT, DPT Acute Rehabilitation  Services Pager #: [phone] Office #: [phone]    Berline Lopes 06/06/2021, 12:20 PM

## 2021-06-06 NOTE — Progress Notes (Signed)
Report called to Ladona Ridgel, RN on 4W.

## 2021-06-06 NOTE — Discharge Instructions (Signed)

## 2021-06-06 NOTE — Discharge Summary (Signed)
Allen Torres [id number] DOB: 24-Aug-1940 DOA: 06/01/2021  PCP: Patient, No Pcp Per (Inactive)  Admit date: 06/01/2021 Discharge date: 06/06/2021  Admitted From: home Disposition:  CIR  Recommendations for Outpatient Follow-up:  Follow up with PCP in 1 week Please obtain BMP/CBC in one week Please follow up with neurology in 2 weeks     Discharge Condition:Stable CODE STATUS:DNR Diet recommendation: Dysphagia 2 diet, nectar thick    Brief/Interim Summary: Per HPI:80 yo WM with hx of HTN, presents to ER with left facial droop and speech.  Patient's last known normal was approximately 10 PM or 11 PM on 05/31/2021.  Patient's daughter Allen Torres states that she went to work early this morning.  Patient was still sleeping.  She states that when she got home around 5 or 6:00 in the evening, she noted that the patient had slurred speech, left-sided facial droop.  Patient states that around 10 or 11 AM today, he started having difficulty with walking.  He noticed his left side of his face felt numb.  He also had difficulty controlling his left arm.  He states that he fell into his recliner while trying to bend down. On arrival to the ER temp 98.7 heart rate 71 blood pressure 157/76.  Labs show sodium 130 potassium 4.4 BUN of 31 creatinine 1.3  White count 6.1, hemoglobin 12.3, platelets 198  Urine drug abuse negative.  COVID and flu swabs are negative. EDP discussed the case with stroke neurology on-call who wanted the patient transferred to [facility].  See their note for the recommendations.   Acute right ischemic stroke: -Patient presented with left-sided weakness and slurred speech and facial drop. -MRI brain shows small area of acute/subacute ischemia in the right centrum semiovale.  No hemorrhage or mass effect.,  UA, UDS: Negative.  TSH: WNL -CT angio of head and neck shows no intracranial occlusion or stenosis. 2/18 MRI with small area of acute/subacute ischemia right centrum semiovale.  No hemorrhage. Goal LDL less than 70>>>refused all antilipid medications  A1C 6.1 No antithrombotic prior to admission, now on aspirin and Plavix.  Per neurology continue DAPT for 3 weeks and then aspirin alone Discharge to CIR today       Hyperlipidemia: Refused all meds including statins and Zetia for treatment       AKI: Likely prerenal was started on ivf initially creatinine stabilizing, not sure if this is his new baseline now Encourage p.o. intake  Avoid nephrotoxic meds            Hypertension:  Continue carbidopa continue atenolol at a lower dose 25 mg daily increase as tolerated           Hyponatremia l likely secondary to chlorthalidone.   TSH normal  Now resolved  Avoid chlorthalidone        Hypomagnesemia:  Replenished.       Normocytic anemia:  H&H is stable.  Continue to monitor.    Discharge Diagnoses:  Principal Problem:   Ischemic stroke [facility]) Active Problems:   Hyponatremia   Essential hypertension   Facial droop due to acute stroke ([place])   Dysarthria due to acute stroke ([place])   Benign hypertension with CKD (chronic kidney disease) stage III ([place])   Mixed hyperlipidemia   DNR (do not resuscitate)/DNI(Do not intubate)    Discharge Instructions  Discharge Instructions     Ambulatory referral to Neurology   Complete by: As directed    Follow up with stroke clinic NP (Venancio Poisson or [facility]  Hassell Done, if both not available, consider Leonie Man, Penumali, or Ahern) at [facility] in about 4 weeks. Thanks.   Diet - low sodium heart healthy   Complete by: As directed    Increase activity slowly   Complete by: As directed       Allergies as of 06/06/2021       Reactions   Colchicine Diarrhea   Hydrochlorothiazide    Other reaction(s): Other (See Comments) Loose stools   Indomethacin    "messed up my liver" Other reaction(s): Other (See Comments) Patient states that this messed up his liver     Statins Other (See Comments)    Patient states that he hurt so bad, hard to get up in the morning.  Felt like his system was shut down and under able to have a bowel movement        Medication List     STOP taking these medications    allopurinol 100 MG tablet Commonly known as: ZYLOPRIM   chlorthalidone 25 MG tablet Commonly known as: HYGROTON   lisinopril 40 MG tablet Commonly known as: ZESTRIL   ondansetron 4 MG disintegrating tablet Commonly known as: Zofran ODT       TAKE these medications    acetaminophen 325 MG tablet Commonly known as: TYLENOL Take 2 tablets (650 mg total) by mouth every 4 (four) hours as needed for mild pain (or temp > 37.5 C (99.5 F)). What changed:  medication strength how much to take when to take this reasons to take this   aspirin 81 MG EC tablet Take 1 tablet (81 mg total) by mouth daily. Swallow whole. Start taking on: June 07, 2021   atenolol 25 MG tablet Commonly known as: TENORMIN Take 1 tablet (25 mg total) by mouth daily. Start taking on: June 07, 2021 What changed:  medication strength how much to take when to take this additional instructions   bisacodyl 10 MG suppository Commonly known as: DULCOLAX Place 1 suppository (10 mg total) rectally daily as needed for moderate constipation.   cholecalciferol 25 MCG (1000 UNIT) tablet Commonly known as: VITAMIN D3 Take 1,000 Units by mouth daily.   clopidogrel 75 MG tablet Commonly known as: PLAVIX Take 1 tablet (75 mg total) by mouth daily for 21 days. Start taking on: June 07, 2021   doxazosin 1 MG tablet Commonly known as: CARDURA Take 1 tablet (1 mg total) by mouth every 12 (twelve) hours. What changed:  medication strength how much to take when to take this   fluticasone 50 MCG/ACT nasal spray Commonly known as: FLONASE Place 1 spray into both nostrils daily as needed for allergies.   meclizine 12.5 MG tablet Commonly known as: ANTIVERT Take 1 tablet (12.5 mg total) by  mouth 3 (three) times daily as needed for dizziness.   senna-docusate 8.6-50 MG tablet Commonly known as: Senokot-S Take 1 tablet by mouth at bedtime as needed for mild constipation.   vitamin B-12 100 MCG tablet Commonly known as: CYANOCOBALAMIN Take 100 mcg by mouth daily.   vitamin C 1000 MG tablet Take 1,000 mg by mouth daily.   zinc gluconate 50 MG tablet Take 50 mg by mouth daily.        Follow-up Information     [facility]. Schedule an appointment as soon as possible for a visit in 1 month(s).   Specialty: Neurology Why: stroke clinic Contact information: [address] [place] [phone]  Allergies  Allergen Reactions   Colchicine Diarrhea   Hydrochlorothiazide     Other reaction(s): Other (See Comments) Loose stools   Indomethacin     "messed up my liver" Other reaction(s): Other (See Comments) Patient states that this messed up his liver     Statins Other (See Comments)    Patient states that he hurt so bad, hard to get up in the morning.  Felt like his system was shut down and under able to have a bowel movement    Consultations: neurology   Procedures/Studies: MR BRAIN WO CONTRAST  Result Date: 06/02/2021 CLINICAL DATA:  Acute neurologic deficit EXAM: MRI HEAD WITHOUT CONTRAST TECHNIQUE: Multiplanar, multiecho pulse sequences of the brain and surrounding structures were obtained without intravenous contrast. COMPARISON:  Head CT 06/01/2021 FINDINGS: Brain: Small area of abnormal diffusion restriction in the right centrum semiovale. No acute or chronic hemorrhage. There is multifocal hyperintense T2-weighted signal within the white matter. Generalized volume loss without a clear lobar predilection. The midline structures are normal. Vascular: Major flow voids are preserved. Skull and upper cervical spine: Normal calvarium and skull base. Visualized upper cervical spine and soft  tissues are normal. Sinuses/Orbits:No paranasal sinus fluid levels or advanced mucosal thickening. No mastoid or middle ear effusion. Normal orbits. IMPRESSION: 1. Small area of acute/subacute ischemia in the right centrum semiovale. No hemorrhage or mass effect. 2. Findings of chronic microvascular ischemia and generalized volume loss. Electronically Signed   By: Ulyses Jarred M.D.   On: 06/02/2021 00:52   DG Swallowing Func-Speech Pathology  Result Date: 06/06/2021 Table formatting from the original result was not included. Objective Swallowing Evaluation: Type of Study: MBS-Modified Barium Swallow Study  Patient Details Name: Allen Torres MRN: [id number] Date of Birth: October 08, 1940 Today's Date: 06/06/2021 Time: SLP Start Time (ACUTE ONLY): 76 -SLP Stop Time (ACUTE ONLY): 1253 SLP Time Calculation (min) (ACUTE ONLY): 13 min Past Medical History: Past Medical History: Diagnosis Date  Hypertension   Renal disorder   Patient states, "chronic kidney disease" Past Surgical History: Past Surgical History: Procedure Laterality Date  BIOPSY  03/16/2020  Procedure: BIOPSY;  Surgeon: Yetta Flock, MD;  Location: [facility];  Service: Gastroenterology;;  CHOLECYSTECTOMY N/A 03/18/2020  Procedure: LAPAROSCOPIC CHOLECYSTECTOMY;  Surgeon: Johnathan Hausen, MD;  Location: [facility];  Service: General;  Laterality: N/A;  ESOPHAGOGASTRODUODENOSCOPY N/A 03/16/2020  Procedure: ESOPHAGOGASTRODUODENOSCOPY (EGD);  Surgeon: Yetta Flock, MD;  Location: [facility];  Service: Gastroenterology;  Laterality: N/A;  facial cystectomy   HPI: Pt is an 81 yo male presenting wtih L facial droop and slurred speech. MRI showed small area of acute/subacute ischemia in the R centrum semiovale. PMH includes: HTN, renal disorder. Increased coughing at bedside therefore MBS recommended.  Subjective: emotional  Recommendations for follow up therapy are one component of a multi-disciplinary discharge planning process, led by the attending  physician.  Recommendations may be updated based on patient status, additional functional criteria and insurance authorization. Assessment / Plan / Recommendation Clinical Impressions 06/06/2021 Clinical Impression Pt exhibits mild oropharyngeal dysphagia with trace aspiration. Decreased containment resulted in sublingual loss with  re establish lingual position for bolus transit with liquid consistencies. Piecemeal transit pattern demonstrated with nectar. Solid bolus mastication was prolonged but complete without significant residue. Laryngeal closure was full however late with thin penetrated to cords and aspirate after without sensation to cough but occasional weak and non productive throat clear. Aspiration present during chin tuck trial. Coordinated and cohesive pharyngeal transit observed with nectar thick barium  consistently. Therapist recommends nectar thick liquids, continue Dys 2 texture, pills crushed, and full fading to intermittent assist with meals. Trial of RMT may be attempted. SLP Visit Diagnosis Dysphagia, oropharyngeal phase (R13.12) Attention and concentration deficit following -- Frontal lobe and executive function deficit following -- Impact on safety and function Mild aspiration risk;Moderate aspiration risk   Treatment Recommendations 06/06/2021 Treatment Recommendations Therapy as outlined in treatment plan below   Prognosis 06/06/2021 Prognosis for Safe Diet Advancement Good Barriers to Reach Goals -- Barriers/Prognosis Comment -- Diet Recommendations 06/06/2021 SLP Diet Recommendations Dysphagia 2 (Fine chop) solids;Nectar thick liquid Liquid Administration via Cup;Straw Medication Administration Crushed with puree Compensations Slow rate;Small sips/bites;Lingual sweep for clearance of pocketing Postural Changes Seated upright at 90 degrees   Other Recommendations 06/06/2021 Recommended Consults -- Oral Care Recommendations Oral care BID Other Recommendations -- Follow Up Recommendations Acute  inpatient rehab (3hours/day) Assistance recommended at discharge Intermittent Supervision/Assistance Functional Status Assessment Patient has had a recent decline in their functional status and demonstrates the ability to make significant improvements in function in a reasonable and predictable amount of time. Frequency and Duration  06/06/2021 Speech Therapy Frequency (ACUTE ONLY) min 2x/week Treatment Duration 2 weeks   Oral Phase 06/06/2021 Oral Phase Impaired Oral - Pudding Teaspoon -- Oral - Pudding Cup -- Oral - Honey Teaspoon -- Oral - Honey Cup -- Oral - Nectar Teaspoon -- Oral - Nectar Cup Piecemeal swallowing;Decreased bolus cohesion;Other (Comment) Oral - Nectar Straw -- Oral - Thin Teaspoon -- Oral - Thin Cup Decreased bolus cohesion Oral - Thin Straw -- Oral - Puree -- Oral - Mech Soft -- Oral - Regular Delayed oral transit Oral - Multi-Consistency -- Oral - Pill -- Oral Phase - Comment --  Pharyngeal Phase 06/06/2021 Pharyngeal Phase Impaired Pharyngeal- Pudding Teaspoon -- Pharyngeal -- Pharyngeal- Pudding Cup -- Pharyngeal -- Pharyngeal- Honey Teaspoon -- Pharyngeal -- Pharyngeal- Honey Cup -- Pharyngeal -- Pharyngeal- Nectar Teaspoon -- Pharyngeal -- Pharyngeal- Nectar Cup WFL Pharyngeal -- Pharyngeal- Nectar Straw WFL Pharyngeal -- Pharyngeal- Thin Teaspoon -- Pharyngeal -- Pharyngeal- Thin Cup Penetration/Aspiration during swallow;Compensatory strategies attempted (with notebox) Pharyngeal Material enters airway, CONTACTS cords and not ejected out;Material enters airway, passes BELOW cords without attempt by patient to eject out (silent aspiration) Pharyngeal- Thin Straw -- Pharyngeal -- Pharyngeal- Puree -- Pharyngeal -- Pharyngeal- Mechanical Soft -- Pharyngeal -- Pharyngeal- Regular WFL Pharyngeal -- Pharyngeal- Multi-consistency -- Pharyngeal -- Pharyngeal- Pill -- Pharyngeal -- Pharyngeal Comment --  Cervical Esophageal Phase  06/06/2021 Cervical Esophageal Phase WFL Pudding Teaspoon -- Pudding  Cup -- Honey Teaspoon -- Honey Cup -- Nectar Teaspoon -- Nectar Cup -- Nectar Straw -- Thin Teaspoon -- Thin Cup -- Thin Straw -- Puree -- Mechanical Soft -- Regular -- Multi-consistency -- Pill -- Cervical Esophageal Comment -- [name] 06/06/2021, 1:53 PM                     ECHOCARDIOGRAM COMPLETE  Result Date: 06/03/2021    ECHOCARDIOGRAM REPORT   Patient Name:   Allen Torres Date of Exam: 06/03/2021 Medical Rec #:  [id number]     Height:       71.0 in Accession #:    [id number]    Weight:       240.0 lb Date of Birth:  10-29-1940    BSA:          2.278 m Patient Age:    27 years      BP:  122/52 mmHg Patient Gender: M             HR:           70 bpm. Exam Location:  Inpatient Procedure: 2D Echo, Cardiac Doppler and Color Doppler Indications:    Stroke I63.9  History:        Patient has no prior history of Echocardiogram examinations.                 Risk Factors:Hypertension.  Sonographer:    Bernadene Person RDCS Referring Phys: [place]  1. Left ventricular ejection fraction, by estimation, is 60 to 65%. The left ventricle has normal function. Left ventricular endocardial border not optimally defined to evaluate regional wall motion. 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 Left ventricular diastolic parameters are consistent with Grade I diastolic dysfunction (impaired relaxation). Elevated left atrial pressure.  2. Right ventricular systolic function is normal. The right ventricular size is normal. There is normal pulmonary artery systolic pressure.  3. The mitral valve is normal in structure. No evidence of mitral valve regurgitation. No evidence of mitral stenosis.  4. The aortic valve is tricuspid. Aortic valve regurgitation is not visualized. Mild to moderate aortic valve stenosis. Aortic valve mean gradient measures 12.0 mmHg. Aortic valve peak gradient measures 21.3 mmHg. Aortic valve area, by VTI measures 1.08  cm.      Lower than expected gradient due to decreased SVI  5. Aortic dilatation noted. There is mild dilatation of the aortic root, measuring 41 mm. 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 FINDINGS  Left Ventricle: Left ventricular ejection fraction, by estimation, is 60 to 65%. The left ventricle has normal function. Left ventricular endocardial border not optimally defined to evaluate regional wall motion. The left ventricular internal cavity size was normal in size. 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 Left ventricular diastolic parameters are consistent with Grade I diastolic dysfunction (impaired relaxation). Elevated left atrial pressure. Right Ventricle: The right ventricular size is normal. Right vetricular wall thickness was not well visualized. Right ventricular systolic function is normal. There is normal pulmonary artery systolic pressure. The tricuspid regurgitant velocity is 2.29 m/s, and with an assumed right atrial pressure of 8 mmHg, the estimated right ventricular systolic pressure is 0000000 mmHg. Left Atrium: Left atrial size was normal in size. Right Atrium: Right atrial size was normal in size. Pericardium: There is no evidence of pericardial effusion. Mitral Valve: The mitral valve is normal in structure. There is mild thickening of the mitral valve leaflet(s). There is mild calcification of the mitral valve leaflet(s). Mild mitral annular calcification. No evidence of mitral valve regurgitation. No evidence of mitral valve stenosis. Tricuspid Valve: The tricuspid valve is not well visualized. Tricuspid valve regurgitation is mild . No evidence of tricuspid stenosis. Aortic Valve: Lower than expected gradient due to decreased SVI. The aortic valve is tricuspid. Aortic valve regurgitation is not visualized. Mild to moderate aortic stenosis is present. Aortic valve mean gradient measures 12.0 mmHg. Aortic valve peak gradient  measures 21.3 mmHg. Aortic valve area, by VTI measures 1.08 cm. Pulmonic Valve: The pulmonic valve was not well visualized. Pulmonic valve regurgitation is mild. No evidence of pulmonic stenosis. Aorta: Aortic dilatation noted. There is mild dilatation of the aortic root, measuring 41 mm. 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 Venous: The inferior vena cava was not well  visualized. IAS/Shunts: The interatrial septum was not well visualized.  LEFT VENTRICLE PLAX 2D LVIDd:         4.80 cm      Diastology LVIDs:         2.90 cm      LV e' medial:    3.24 cm/s LV PW:         1.40 cm      LV E/e' medial:  18.3 LV IVS:        1.90 cm      LV e' lateral:   4.91 cm/s LVOT diam:     2.10 cm      LV E/e' lateral: 12.1 LV SV:         51 LV SV Index:   22 LVOT Area:     3.46 cm  LV Volumes (MOD) LV vol d, MOD A2C: 89.3 ml LV vol d, MOD A4C: 103.0 ml LV vol s, MOD A2C: 54.8 ml LV vol s, MOD A4C: 60.4 ml LV SV MOD A2C:     34.5 ml LV SV MOD A4C:     103.0 ml LV SV MOD BP:      41.9 ml RIGHT VENTRICLE RV S prime:     7.10 cm/s TAPSE (M-mode): 1.5 cm LEFT ATRIUM             Index        RIGHT ATRIUM           Index LA diam:        3.90 cm 1.71 cm/m   RA Area:     13.20 cm LA Vol (A2C):   44.6 ml 19.57 ml/m  RA Volume:   27.50 ml  12.07 ml/m LA Vol (A4C):   62.3 ml 27.34 ml/m LA Biplane Vol: 53.4 ml 23.44 ml/m  AORTIC VALVE AV Area (Vmax):    1.02 cm AV Area (Vmean):   1.02 cm AV Area (VTI):     1.08 cm AV Vmax:           230.67 cm/s AV Vmean:          161.000 cm/s AV VTI:            0.470 m AV Peak Grad:      21.3 mmHg AV Mean Grad:      12.0 mmHg LVOT Vmax:         68.17 cm/s LVOT Vmean:        47.200 cm/s LVOT VTI:          0.146 m LVOT/AV VTI ratio: 0.31  AORTA Ao Root diam: 4.10 cm Ao Asc diam:  4.90 cm MITRAL VALVE                TRICUSPID VALVE MV Area (PHT): 3.34 cm     TR Peak grad:   21.0 mmHg MV Decel Time: 227 msec     TR Vmax:        229.00 cm/s MV E velocity: 59.40  cm/s MV A velocity: 124.00 cm/s  SHUNTS MV E/A ratio:  0.48         Systemic VTI:  0.15 m                             Systemic Diam: 2.10 cm Carlyle Dolly MD Electronically signed by Carlyle Dolly MD Signature Date/Time: 06/03/2021/5:34:23 PM    Final    CT HEAD CODE STROKE WO CONTRAST  Result Date: 06/01/2021 CLINICAL DATA:  Code stroke. Neuro deficit, acute, stroke suspected. EXAM: CT HEAD WITHOUT CONTRAST TECHNIQUE: Contiguous axial images were obtained from the base of the skull through the vertex without intravenous contrast. RADIATION DOSE REDUCTION: This exam was performed according to the departmental dose-optimization program which includes automated exposure control, adjustment of the mA and/or kV according to patient size and/or use of iterative reconstruction technique. COMPARISON:  None. FINDINGS: Brain: No abnormality seen affecting the brainstem or cerebellum. Cerebral hemispheres show age related atrophy with old small vessel infarctions of the thalami, basal ganglia and hemispheric white matter. No sign of acute infarction, mass lesion, hemorrhage, hydrocephalus or extra-axial collection. Vascular: There is atherosclerotic calcification of the major vessels at the base of the brain. Skull: Negative Sinuses/Orbits: Clear/normal Other: None ASPECTS ([place] Stroke Program Early CT Score) - Ganglionic level infarction (caudate, lentiform nuclei, internal capsule, insula, M1-M3 cortex): 7 - Supraganglionic infarction (M4-M6 cortex): 3 Total score (0-10 with 10 being normal): 10 IMPRESSION: 1. No acute CT finding. 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 2. ASPECTS is 10. 3. These results were called by telephone at the time of interpretation on 06/01/2021 at 9:20 pm to provider Dr. Alvino Chapel, who verbally acknowledged these results. Electronically Signed   By: Nelson Chimes M.D.   On: 06/01/2021 21:26   CT ANGIO HEAD NECK W WO CM W PERF (CODE  STROKE)  Result Date: 06/01/2021 CLINICAL DATA:  CODE STROKE EXAM: CT ANGIOGRAPHY HEAD AND NECK CT PERFUSION BRAIN TECHNIQUE: Multidetector CT imaging of the head and neck was performed using the standard protocol during bolus administration of intravenous contrast. Multiplanar CT image reconstructions and MIPs were obtained to evaluate the vascular anatomy. Carotid stenosis measurements (when applicable) are obtained utilizing NASCET criteria, using the distal internal carotid diameter as the denominator. Multiphase CT imaging of the brain was performed following IV bolus contrast injection. Subsequent parametric perfusion maps were calculated using RAPID software. RADIATION DOSE REDUCTION: This exam was performed according to the departmental dose-optimization program which includes automated exposure control, adjustment of the mA and/or kV according to patient size and/or use of iterative reconstruction technique. CONTRAST:  146mL OMNIPAQUE IOHEXOL 350 MG/ML SOLN, 76mL OMNIPAQUE IOHEXOL 350 MG/ML SOLN COMPARISON:  None. FINDINGS: CTA NECK FINDINGS SKELETON: There is no bony spinal canal stenosis. No lytic or blastic lesion. OTHER NECK: Normal pharynx, larynx and major salivary glands. No cervical lymphadenopathy. Unremarkable thyroid gland. UPPER CHEST: No pneumothorax or pleural effusion. No nodules or masses. AORTIC ARCH: There is calcific atherosclerosis of the aortic arch. There is decreased enhancement of the visualized portion of the descending aorta. The visualized proximal subclavian arteries are widely patent. There are numerous prominent collaterals in the posterior mediastinum and paraspinous musculature. RIGHT CAROTID SYSTEM: No dissection, occlusion or aneurysm. There is mixed density atherosclerosis extending into the proximal ICA, resulting in less than 50% stenosis. LEFT CAROTID SYSTEM: Normal without aneurysm, dissection or stenosis. VERTEBRAL ARTERIES: Right dominant configuration. Both  origins are clearly patent. There is no dissection, occlusion or flow-limiting stenosis to the skull base (V1-V3 segments). CTA HEAD FINDINGS POSTERIOR CIRCULATION: --Vertebral arteries: Normal V4 segments. --Inferior cerebellar arteries: Normal. --Basilar artery: Normal. --Superior cerebellar arteries: Normal. --Posterior cerebral arteries (PCA): Normal. ANTERIOR CIRCULATION: --Intracranial internal carotid arteries: Normal. --Anterior cerebral arteries (ACA): Normal. Both A1 segments are present. Patent anterior communicating artery (a-comm). --Middle cerebral arteries (MCA): Normal. VENOUS SINUSES: As permitted by contrast timing, patent. ANATOMIC VARIANTS: None Review  of the MIP images confirms the above findings. CT Brain Perfusion Findings: Perfusion data is unreliable due to motion artifacts and abnormal bolus characteristics. IMPRESSION: 1. No intracranial arterial occlusion or high-grade stenosis. 2. Unreliable perfusion data due to motion artifacts and abnormal bolus characteristics. 3. 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 Further characterization with CTA of the chest is recommended. 4. Mixed density right carotid bifurcation plaque with less than 50% stenosis. Aortic Atherosclerosis (ICD10-I70.0). Electronically Signed   By: Ulyses Jarred M.D.   On: 06/01/2021 22:20      Subjective: No complaints today  Discharge Exam: Vitals:   06/06/21 1143 06/06/21 1533  BP: 133/81 123/73  Pulse: 89 82  Resp: 18 20  Temp: 97.6 F (36.4 C) 98.3 F (36.8 C)  SpO2: 95% 96%   Vitals:   06/06/21 0348 06/06/21 0743 06/06/21 1143 06/06/21 1533  BP: 124/66 132/89 133/81 123/73  Pulse: 85 80 89 82  Resp: 17 18 18 20   Temp: 98.5 F (36.9 C) 98.8 F (37.1 C) 97.6 F (36.4 C) 98.3 F (36.8 C)  TempSrc: Oral Oral Oral Oral  SpO2: 94% 98% 95% 96%  Weight:      Height:        General: Pt is alert, awake, not in acute  distress Cardiovascular: RRR, S1/S2 +, no rubs, no gallops Respiratory: CTA bilaterally, no wheezing, no rhonchi Abdominal: Soft, NT, ND, bowel sounds + Extremities: no edema, no cyanosis    The results of significant diagnostics from this hospitalization (including imaging, microbiology, ancillary and laboratory) are listed below for reference.     Microbiology: Recent Results (from the past 240 hour(s))  Resp Panel by RT-PCR (Flu A&B, Covid) Nasopharyngeal Swab     Status: None   Collection Time: 06/01/21  9:40 PM   Specimen: Nasopharyngeal Swab; Nasopharyngeal(NP) swabs in vial transport medium  Result Value Ref Range Status   SARS Coronavirus 2 by RT PCR NEGATIVE NEGATIVE Final    Comment: (NOTE) SARS-CoV-2 target nucleic acids are NOT DETECTED.  The SARS-CoV-2 RNA is generally detectable in upper respiratory specimens during the acute phase of infection. The lowest concentration of SARS-CoV-2 viral copies this assay can detect is 138 copies/mL. A negative result does not preclude SARS-Cov-2 infection and should not be used as the sole basis for treatment or other patient management decisions. A negative result may occur with  improper specimen collection/handling, submission of specimen other than nasopharyngeal swab, presence of viral mutation(s) within the areas targeted by this assay, and inadequate number of viral copies(<138 copies/mL). A negative result must be combined with clinical observations, patient history, and epidemiological information. The expected result is Negative.  Fact Sheet for Patients:  EntrepreneurPulse.com.au  Fact Sheet for Healthcare Providers:  IncredibleEmployment.be  This test is no t yet approved or cleared by the [place] FDA and  has been authorized for detection and/or diagnosis of SARS-CoV-2 by FDA under an Emergency Use Authorization (EUA). This EUA will remain  in effect (meaning this test  can be used) for the duration of the COVID-19 declaration under Section 564(b)(1) of the Act, 21 U.S.C.section 360bbb-3(b)(1), unless the authorization is terminated  or revoked sooner.       Influenza A by PCR NEGATIVE NEGATIVE Final   Influenza B by PCR NEGATIVE NEGATIVE Final    Comment: (NOTE) The Xpert Xpress SARS-CoV-2/FLU/RSV plus assay is intended as an aid in the diagnosis of influenza from Nasopharyngeal swab specimens and should not  be used as a sole basis for treatment. Nasal washings and aspirates are unacceptable for Xpert Xpress SARS-CoV-2/FLU/RSV testing.  Fact Sheet for Patients: EntrepreneurPulse.com.au  Fact Sheet for Healthcare Providers: IncredibleEmployment.be  This test is not yet approved or cleared by the [place] FDA and has been authorized for detection and/or diagnosis of SARS-CoV-2 by FDA under an Emergency Use Authorization (EUA). This EUA will remain in effect (meaning this test can be used) for the duration of the COVID-19 declaration under Section 564(b)(1) of the Act, 21 U.S.C. section 360bbb-3(b)(1), unless the authorization is terminated or revoked.  Performed at [facility], [place] [address] [place], [place] [address]      Labs: BNP (last 3 results) No results for input(s): BNP in the last 8760 hours. Basic Metabolic Panel: Recent Labs  Lab 06/01/21 2102 06/01/21 2110 06/02/21 0211 06/03/21 0446 06/05/21 0841  NA 130* 133* 132* 136  --   K 4.4 4.5 4.1 4.4  --   CL 101 102 100 102  --   CO2 22  --  23 24  --   GLUCOSE 186* 184* 90 88  --   BUN 31* 26* 29* 24*  --   CREATININE 1.33* 1.40* 1.26* 1.59* 1.56*  CALCIUM 9.4  --  9.4 9.4  --   MG  --   --  1.6* 1.9  --    Liver Function Tests: Recent Labs  Lab 06/01/21 2102 06/02/21 0211  AST 22 17  ALT 17 15  ALKPHOS 69 64  BILITOT 0.5 0.7  PROT 6.9 6.6  ALBUMIN 4.1 3.8   No results for input(s): LIPASE, AMYLASE  in the last 168 hours. No results for input(s): AMMONIA in the last 168 hours. CBC: Recent Labs  Lab 06/01/21 2102 06/01/21 2110 06/02/21 0211  WBC 6.1  --  6.5  NEUTROABS 3.3  --  3.3  HGB 12.3* 12.6* 11.6*  HCT 36.1* 37.0* 33.3*  MCV 98.1  --  96.2  PLT 198  --  179   Cardiac Enzymes: No results for input(s): CKTOTAL, CKMB, CKMBINDEX, TROPONINI in the last 168 hours. BNP: Invalid input(s): POCBNP CBG: Recent Labs  Lab 06/01/21 2136  GLUCAP 146*   D-Dimer No results for input(s): DDIMER in the last 72 hours. Hgb A1c No results for input(s): HGBA1C in the last 72 hours. Lipid Profile No results for input(s): CHOL, HDL, LDLCALC, TRIG, CHOLHDL, LDLDIRECT in the last 72 hours. Thyroid function studies No results for input(s): TSH, T4TOTAL, T3FREE, THYROIDAB in the last 72 hours.  Invalid input(s): FREET3 Anemia work up No results for input(s): VITAMINB12, FOLATE, FERRITIN, TIBC, IRON, RETICCTPCT in the last 72 hours. Urinalysis    Component Value Date/Time   COLORURINE YELLOW 06/01/2021 2145   APPEARANCEUR CLEAR 06/01/2021 2145   LABSPEC 1.011 06/01/2021 2145   PHURINE 5.0 06/01/2021 2145   GLUCOSEU NEGATIVE 06/01/2021 2145   HGBUR NEGATIVE 06/01/2021 2145   BILIRUBINUR NEGATIVE 06/01/2021 2145   [place] NEGATIVE 06/01/2021 2145   PROTEINUR NEGATIVE 06/01/2021 2145   NITRITE NEGATIVE 06/01/2021 2145   LEUKOCYTESUR NEGATIVE 06/01/2021 2145   Sepsis Labs Invalid input(s): PROCALCITONIN,  WBC,  LACTICIDVEN Microbiology Recent Results (from the past 240 hour(s))  Resp Panel by RT-PCR (Flu A&B, Covid) Nasopharyngeal Swab     Status: None   Collection Time: 06/01/21  9:40 PM   Specimen: Nasopharyngeal Swab; Nasopharyngeal(NP) swabs in vial transport medium  Result Value Ref Range Status   SARS Coronavirus 2 by RT PCR  NEGATIVE NEGATIVE Final    Comment: (NOTE) SARS-CoV-2 target nucleic acids are NOT DETECTED.  The SARS-CoV-2 RNA is generally detectable in upper  respiratory specimens during the acute phase of infection. The lowest concentration of SARS-CoV-2 viral copies this assay can detect is 138 copies/mL. A negative result does not preclude SARS-Cov-2 infection and should not be used as the sole basis for treatment or other patient management decisions. A negative result may occur with  improper specimen collection/handling, submission of specimen other than nasopharyngeal swab, presence of viral mutation(s) within the areas targeted by this assay, and inadequate number of viral copies(<138 copies/mL). A negative result must be combined with clinical observations, patient history, and epidemiological information. The expected result is Negative.  Fact Sheet for Patients:  EntrepreneurPulse.com.au  Fact Sheet for Healthcare Providers:  IncredibleEmployment.be  This test is no t yet approved or cleared by the [place] FDA and  has been authorized for detection and/or diagnosis of SARS-CoV-2 by FDA under an Emergency Use Authorization (EUA). This EUA will remain  in effect (meaning this test can be used) for the duration of the COVID-19 declaration under Section 564(b)(1) of the Act, 21 U.S.C.section 360bbb-3(b)(1), unless the authorization is terminated  or revoked sooner.       Influenza A by PCR NEGATIVE NEGATIVE Final   Influenza B by PCR NEGATIVE NEGATIVE Final    Comment: (NOTE) The Xpert Xpress SARS-CoV-2/FLU/RSV plus assay is intended as an aid in the diagnosis of influenza from Nasopharyngeal swab specimens and should not be used as a sole basis for treatment. Nasal washings and aspirates are unacceptable for Xpert Xpress SARS-CoV-2/FLU/RSV testing.  Fact Sheet for Patients: EntrepreneurPulse.com.au  Fact Sheet for Healthcare Providers: IncredibleEmployment.be  This test is not yet approved or cleared by the [place] FDA and has been  authorized for detection and/or diagnosis of SARS-CoV-2 by FDA under an Emergency Use Authorization (EUA). This EUA will remain in effect (meaning this test can be used) for the duration of the COVID-19 declaration under Section 564(b)(1) of the Act, 21 U.S.C. section 360bbb-3(b)(1), unless the authorization is terminated or revoked.  Performed at [facility], [place] [address] [place], [place] [address]      Time coordinating discharge: Over 30 minutes  SIGNED:   Nolberto Hanlon, MD  [facility] 06/06/2021, 4:07 PM Pager   If 7PM-7AM, please contact night-coverage [email or web address] Password [email or web address]

## 2021-06-06 NOTE — Progress Notes (Signed)
Inpatient Rehab Admissions Coordinator:  ? ?I have a bed for this Pt. On CIR today. RN may call report to [phone]. ? ?Damon Baisch, MS, CCC-SLP ?Rehab Admissions Coordinator  ?[phone] (celll) ?[phone] (office) ?

## 2021-06-06 NOTE — Progress Notes (Signed)
Modified Barium Swallow Progress Note  Patient Details  Name: Allen Torres MRN: [id number] Date of Birth: Aug 31, 1940  Today's Date: 06/06/2021  Modified Barium Swallow completed.  Full report located under Chart Review in the Imaging Section.  Brief recommendations include the following:  Clinical Impression  Pt exhibits mild oropharyngeal dysphagia with trace aspiration. Decreased containment resulted in sublingual loss with  re establish lingual position for bolus transit with liquid consistencies. Piecemeal transit pattern demonstrated with nectar. Solid bolus mastication was prolonged but complete without significant residue. Laryngeal closure was full however late with thin penetrated to cords and aspirate after without sensation to cough but occasional weak and non productive throat clear. Aspiration present during chin tuck trial. Coordinated and cohesive pharyngeal transit observed with nectar thick barium consistently. Therapist recommends nectar thick liquids, continue Dys 2 texture, pills crushed, and full fading to intermittent assist with meals. Trial of RMT may be attempted.   Swallow Evaluation Recommendations       SLP Diet Recommendations: Dysphagia 2 (Fine chop) solids;Nectar thick liquid   Liquid Administration via: Cup;Straw   Medication Administration: Crushed with puree   Supervision: Patient able to self feed;Full supervision/cueing for compensatory strategies   Compensations: Slow rate;Small sips/bites;Lingual sweep for clearance of pocketing   Postural Changes: Seated upright at 90 degrees   Oral Care Recommendations: Oral care BID        Allen Torres 06/06/2021,1:55 PM  Allen Torres.Ed Nurse, children's [phone] Office [phone]

## 2021-06-06 NOTE — H&P (Incomplete)
Physical Medicine and Rehabilitation Admission H&P    Chief Complaint  Patient presents with   Aphasia   Code Stroke  CC: Functional deficits secondary to  HPI: Allen Torres is an 81 year old male in his usual state of good health until the late afternoon of 06/01/2021.  He lives with his daughter who came home from work and noted he had slurred speech and left facial droop.  He also had left upper extremity weakness.  He presented to [facility] emergency department where code stroke was called.  He was out of the window for thrombolytics.  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  The patient has a history of chronic kidney disease and creatinine appears to be elevated above baseline.  He is placed on dual antiplatelet therapy and will continue Plavix and aspirin for 3 weeks then aspirin alone.   He also endorses obstructive bladder symptoms and constipation.  Has significant hearing loss but has not been able to use hearing aids.  He is currently tolerating dysphagia 3 diet with nectar thick liquids.  His CODE STATUS is DNR. The patient requires inpatient medicine and rehabilitation evaluations and services for ongoing dysfunction secondary to right corona radiata infarct.  The patient lives with his daughter who works full-time.  He states he was not requiring aids for ambulation prior to admission.  Review of Systems  HENT:  Positive for hearing loss.        No hearing aids due to expense. Left ear is worse. Edentulous>>eats all textures without his dentures>>ill fitting  Eyes:  Negative for blurred vision and double vision.  Respiratory:  Negative for cough, hemoptysis and shortness of breath.   Cardiovascular:  Negative for chest pain.  Gastrointestinal:  Positive for constipation. Negative for abdominal pain, nausea and vomiting.       Endorses significant  constipation. History hemorrhoids.  Has had bleeding from rectum after using a Fleet enema.  Genitourinary:        + urinary hesitancy, "prostate problems"  Musculoskeletal:  Positive for back pain and joint pain. Negative for falls.  Neurological:  Positive for focal weakness. Negative for headaches.  Past Medical History:  Diagnosis Date   Hypertension    Renal disorder    Patient states, "chronic kidney disease"   Past Surgical History:  Procedure Laterality Date   BIOPSY  03/16/2020   Procedure: BIOPSY;  Surgeon: Yetta Flock, MD;  Location: [facility];  Service: Gastroenterology;;   CHOLECYSTECTOMY N/A 03/18/2020   Procedure: LAPAROSCOPIC CHOLECYSTECTOMY;  Surgeon: Johnathan Hausen, MD;  Location: [facility];  Service: General;  Laterality: N/A;   ESOPHAGOGASTRODUODENOSCOPY N/A 03/16/2020   Procedure: ESOPHAGOGASTRODUODENOSCOPY (EGD);  Surgeon: Yetta Flock, MD;  Location: [facility];  Service: Gastroenterology;  Laterality: N/A;   facial cystectomy     History reviewed. No pertinent family history. Social History:  reports that he has quit smoking. He has never used smokeless tobacco. He reports that he does not drink alcohol and does not use drugs. Allergies:  Allergies  Allergen Reactions   Colchicine Diarrhea   Hydrochlorothiazide     Other reaction(s): Other (See Comments) Loose stools   Indomethacin     "messed up my liver" Other reaction(s): Other (See Comments) Patient states that this messed up his liver     Statins Other (See Comments)    Patient states that he hurt so bad, hard  to get up in the morning.  Felt like his system was shut down and under able to have a bowel movement   Medications Prior to Admission  Medication Sig Dispense Refill   acetaminophen (TYLENOL) 500 MG tablet Take 1,000 mg by mouth every 6 (six) hours as needed for moderate pain.     allopurinol (ZYLOPRIM) 100 MG tablet Take 200 mg by mouth daily.     Ascorbic Acid (VITAMIN C)  1000 MG tablet Take 1,000 mg by mouth daily.     atenolol (TENORMIN) 100 MG tablet Take 50-100 mg by mouth 2 (two) times daily. Take 1 tablet (100 mg) in the morning and 1/2 tablet (50 mg) at bedtime     chlorthalidone (HYGROTON) 25 MG tablet Take 25 mg by mouth daily.     cholecalciferol (VITAMIN D3) 25 MCG (1000 UNIT) tablet Take 1,000 Units by mouth daily.     doxazosin (CARDURA) 2 MG tablet Take 2 mg by mouth every evening.      fluticasone (FLONASE) 50 MCG/ACT nasal spray Place 1 spray into both nostrils daily as needed for allergies.      lisinopril (ZESTRIL) 40 MG tablet Take 40 mg by mouth daily.      vitamin B-12 (CYANOCOBALAMIN) 100 MCG tablet Take 100 mcg by mouth daily.     ondansetron (ZOFRAN ODT) 4 MG disintegrating tablet Take 1 tablet (4 mg total) by mouth every 8 (eight) hours as needed for nausea or vomiting. 6 tablet 0   zinc gluconate 50 MG tablet Take 50 mg by mouth daily.        Home: Home Living Family/patient expects to be discharged to:: Private residence Living Arrangements: Children Available Help at Discharge: Family Type of Home: House Home Access: Stairs to enter, Ramped entrance Home Layout: Able to live on main level with bedroom/bathroom Bathroom Shower/Tub: Chiropodist: Standard Bathroom Accessibility: Yes Home Equipment: None  Lives With: Daughter   Functional History: Prior Function Prior Level of Function : Independent/Modified Independent  Functional Status:  Mobility: Bed Mobility Overal bed mobility: Needs Assistance Bed Mobility: Supine to Sit Supine to sit: HOB elevated, Min assist Sit to supine: Mod assist General bed mobility comments: verbal cues to use L UE, HOB elevated, increased time Transfers Overall transfer level: Needs assistance Equipment used: Rolling walker (2 wheels) Transfers: Sit to/from Stand Sit to Stand: Min assist General transfer comment: minA to power up, verbal cues for safe hand  placement, no carry over despite 3 trials of sit to stand Ambulation/Gait Ambulation/Gait assistance: Min assist Gait Distance (Feet): 160 Feet Assistive device: Rolling walker (2 wheels) Gait Pattern/deviations: Step-through pattern, Decreased stride length General Gait Details: pt with improved ability to stay in middle of walker versus L side of walker. With onset of fatigue pt would begin to have difficulty clearing L foot requiring verbal cues to continue to lift foot and not slid it across the floor Gait velocity: decr Gait velocity interpretation: <1.31 ft/sec, indicative of household ambulator    ADL: ADL Overall ADL's : Needs assistance/impaired Grooming: Wash/dry hands, Minimal assistance, Wash/dry face, Standing Grooming Details (indicate cue type and reason): Patient stood at sink and required assistance with turning on hot water with LUE Upper Body Bathing: Min guard, Sitting Lower Body Bathing: Maximal assistance, Sitting/lateral leans, Sit to/from stand Lower Body Bathing Details (indicate cue type and reason): due to impaired sitting and standing balance Upper Body Dressing : Min guard, Sitting Lower Body Dressing: Maximal assistance, Sitting/lateral leans,  Sit to/from stand Lower Body Dressing Details (indicate cue type and reason): Have patient attempt donning socks has posterior loss of balance trying to don R sock and getting stuck on toe nails. Patient unable to complete figure 4 method to don L sock needing total A and again difficulty maintaining sitting balance even to lift foot up for OT to don sock. Toilet Transfer: Minimal assistance, Ambulation, Rolling walker (2 wheels) Toilet Transfer Details (indicate cue type and reason): min assist due to assistance maneuvering RW in bathroom Toileting- Clothing Manipulation and Hygiene: Maximal assistance, Sit to/from stand Toileting - Clothing Manipulation Details (indicate cue type and reason): To don clean brief, limited  standing balance needing to hold onto walker. Functional mobility during ADLs: Minimal assistance, Cueing for safety, Cueing for sequencing, Rolling walker (2 wheels) General ADL Comments: Patient attempted to use LUE to assist with turning on water and washing face  Cognition: Cognition Overall Cognitive Status: Impaired/Different from baseline Orientation Level: Oriented X4 Cognition Arousal/Alertness: Awake/alert Behavior During Therapy: WFL for tasks assessed/performed Overall Cognitive Status: Impaired/Different from baseline Area of Impairment: Problem solving, Following commands, Memory, Attention Current Attention Level: Sustained Memory: Decreased short-term memory Following Commands: Follows one step commands with increased time Problem Solving: Difficulty sequencing, Slow processing General Comments: pt with improved spatial awareness today. difficulty problem solving on how to get shoes on when L hand wasn't working  Physical Exam: Blood pressure 133/81, pulse 89, temperature 97.6 F (36.4 C), temperature source Oral, resp. rate 18, height 5\' 11"  (1.803 m), weight 108.9 kg, SpO2 95 %. Physical Exam Constitutional:      Appearance: He is obese.  HENT:     Head: Normocephalic and atraumatic.  Pulmonary:     Effort: Pulmonary effort is normal.     Breath sounds: Normal breath sounds.  Abdominal:     General: There is no distension.     Tenderness: There is no abdominal tenderness.  Neurological:     Mental Status: He is alert.     Coordination: Coordination abnormal.     Comments: Left facial droop, slurred speech. LUE clumsiness    Results for orders placed or performed during the hospital encounter of 06/01/21 (from the past 48 hour(s))  Creatinine, serum     Status: Abnormal   Collection Time: 06/05/21  8:41 AM  Result Value Ref Range   Creatinine, Ser 1.56 (H) 0.61 - 1.24 mg/dL   GFR, Estimated 45 (L) >60 mL/min    Comment: (NOTE) Calculated using the  CKD-EPI Creatinine Equation (2021) Performed at [facility], [place] [address], [place], [place] [address]    DG Swallowing Func-Speech Pathology  Result Date: 06/06/2021 Table formatting from the original result was not included. Objective Swallowing Evaluation: Type of Study: MBS-Modified Barium Swallow Study  Patient Details Name: RONIT HAUGHN MRN: [id number] Date of Birth: 04/08/1941 Today's Date: 06/06/2021 Time: SLP Start Time (ACUTE ONLY): 28 -SLP Stop Time (ACUTE ONLY): 1253 SLP Time Calculation (min) (ACUTE ONLY): 13 min Past Medical History: Past Medical History: Diagnosis Date  Hypertension   Renal disorder   Patient states, "chronic kidney disease" Past Surgical History: Past Surgical History: Procedure Laterality Date  BIOPSY  03/16/2020  Procedure: BIOPSY;  Surgeon: Yetta Flock, MD;  Location: [facility];  Service: Gastroenterology;;  CHOLECYSTECTOMY N/A 03/18/2020  Procedure: LAPAROSCOPIC CHOLECYSTECTOMY;  Surgeon: Johnathan Hausen, MD;  Location: [facility];  Service: General;  Laterality: N/A;  ESOPHAGOGASTRODUODENOSCOPY N/A 03/16/2020  Procedure: ESOPHAGOGASTRODUODENOSCOPY (EGD);  Surgeon: Yetta Flock,  MD;  Location: [facility];  Service: Gastroenterology;  Laterality: N/A;  facial cystectomy   HPI: Pt is an 81 yo male presenting wtih L facial droop and slurred speech. MRI showed small area of acute/subacute ischemia in the R centrum semiovale. PMH includes: HTN, renal disorder. Increased coughing at bedside therefore MBS recommended.  Subjective: emotional  Recommendations for follow up therapy are one component of a multi-disciplinary discharge planning process, led by the attending physician.  Recommendations may be updated based on patient status, additional functional criteria and insurance authorization. Assessment / Plan / Recommendation Clinical Impressions 06/06/2021 Clinical Impression Pt exhibits mild oropharyngeal dysphagia with trace aspiration. Decreased  containment resulted in sublingual loss with  re establish lingual position for bolus transit with liquid consistencies. Piecemeal transit pattern demonstrated with nectar. Solid bolus mastication was prolonged but complete without significant residue. Laryngeal closure was full however late with thin penetrated to cords and aspirate after without sensation to cough but occasional weak and non productive throat clear. Aspiration present during chin tuck trial. Coordinated and cohesive pharyngeal transit observed with nectar thick barium consistently. Therapist recommends nectar thick liquids, continue Dys 2 texture, pills crushed, and full fading to intermittent assist with meals. Trial of RMT may be attempted. SLP Visit Diagnosis Dysphagia, oropharyngeal phase (R13.12) Attention and concentration deficit following -- Frontal lobe and executive function deficit following -- Impact on safety and function Mild aspiration risk;Moderate aspiration risk   Treatment Recommendations 06/06/2021 Treatment Recommendations Therapy as outlined in treatment plan below   Prognosis 06/06/2021 Prognosis for Safe Diet Advancement Good Barriers to Reach Goals -- Barriers/Prognosis Comment -- Diet Recommendations 06/06/2021 SLP Diet Recommendations Dysphagia 2 (Fine chop) solids;Nectar thick liquid Liquid Administration via Cup;Straw Medication Administration Crushed with puree Compensations Slow rate;Small sips/bites;Lingual sweep for clearance of pocketing Postural Changes Seated upright at 90 degrees   Other Recommendations 06/06/2021 Recommended Consults -- Oral Care Recommendations Oral care BID Other Recommendations -- Follow Up Recommendations Acute inpatient rehab (3hours/day) Assistance recommended at discharge Intermittent Supervision/Assistance Functional Status Assessment Patient has had a recent decline in their functional status and demonstrates the ability to make significant improvements in function in a reasonable and  predictable amount of time. Frequency and Duration  06/06/2021 Speech Therapy Frequency (ACUTE ONLY) min 2x/week Treatment Duration 2 weeks   Oral Phase 06/06/2021 Oral Phase Impaired Oral - Pudding Teaspoon -- Oral - Pudding Cup -- Oral - Honey Teaspoon -- Oral - Honey Cup -- Oral - Nectar Teaspoon -- Oral - Nectar Cup Piecemeal swallowing;Decreased bolus cohesion;Other (Comment) Oral - Nectar Straw -- Oral - Thin Teaspoon -- Oral - Thin Cup Decreased bolus cohesion Oral - Thin Straw -- Oral - Puree -- Oral - Mech Soft -- Oral - Regular Delayed oral transit Oral - Multi-Consistency -- Oral - Pill -- Oral Phase - Comment --  Pharyngeal Phase 06/06/2021 Pharyngeal Phase Impaired Pharyngeal- Pudding Teaspoon -- Pharyngeal -- Pharyngeal- Pudding Cup -- Pharyngeal -- Pharyngeal- Honey Teaspoon -- Pharyngeal -- Pharyngeal- Honey Cup -- Pharyngeal -- Pharyngeal- Nectar Teaspoon -- Pharyngeal -- Pharyngeal- Nectar Cup WFL Pharyngeal -- Pharyngeal- Nectar Straw WFL Pharyngeal -- Pharyngeal- Thin Teaspoon -- Pharyngeal -- Pharyngeal- Thin Cup Penetration/Aspiration during swallow;Compensatory strategies attempted (with notebox) Pharyngeal Material enters airway, CONTACTS cords and not ejected out;Material enters airway, passes BELOW cords without attempt by patient to eject out (silent aspiration) Pharyngeal- Thin Straw -- Pharyngeal -- Pharyngeal- Puree -- Pharyngeal -- Pharyngeal- Mechanical Soft -- Pharyngeal -- Pharyngeal- Regular WFL Pharyngeal -- Pharyngeal- Multi-consistency -- Pharyngeal --  Pharyngeal- Pill -- Pharyngeal -- Pharyngeal Comment --  Cervical Esophageal Phase  06/06/2021 Cervical Esophageal Phase WFL Pudding Teaspoon -- Pudding Cup -- Honey Teaspoon -- Honey Cup -- Nectar Teaspoon -- Nectar Cup -- Nectar Straw -- Thin Teaspoon -- Thin Cup -- Thin Straw -- Puree -- Mechanical Soft -- Regular -- Multi-consistency -- Pill -- Cervical Esophageal Comment -- [name] 06/06/2021, 1:53 PM                         Blood pressure 133/81, pulse 89, temperature 97.6 F (36.4 C), temperature source Oral, resp. rate 18, height 5\' 11"  (1.803 m), weight 108.9 kg, SpO2 95 %.  Medical Problem List and Plan: 1. Functional deficits secondary to right CR infarct likely due to small vessel disease  -patient may *** shower  -ELOS/Goals: *** 2.  Antithrombotics: -DVT/anticoagulation:  Pharmaceutical: Heparin  -antiplatelet therapy: Plavix and aspirin 81 mg for 3 weeks then aspirin alone 3. Pain Management: Tylenol 4. Mood: LCSW to evaluate and provide emotional support  -antipsychotic agents: n/a 5. Neuropsych: This patient is capable of making decisions on his own behalf. 6. Skin/Wound Care: Routine skin care checks 7. Fluids/Electrolytes/Nutrition: routine Is and Os and follow-up chemistries 8. 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 Follow-up BMP in AM. 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  Allopurinol held     ***  Barbie Banner, PA-C 06/06/2021

## 2021-06-06 NOTE — TOC Transition Note (Signed)
Transition of Care [facility]) - CM/SW Discharge Note   Patient Details  Name: Allen Torres MRN: [id number] Date of Birth: 03-03-1941  [facility] CM/SW Contact:  Pollie Friar, RN Phone Number: 06/06/2021, 4:08 PM   Clinical Narrative:    Patient is discharging to CIR today. CM signing off.   Final next level of care: IP Rehab Facility Barriers to Discharge: No Barriers Identified   Patient Goals and CMS Choice     Choice offered to / list presented to : Patient  Discharge Placement                       Discharge Plan and Services                                     Social Determinants of Health (SDOH) Interventions     Readmission Risk Interventions No flowsheet data found.

## 2021-06-06 NOTE — Progress Notes (Signed)
Inpatient Rehab Admissions Coordinator:   I do not yet have insurance auth or a CIR bed for this Pt. Today. I will follow for potential admit pending insurance auth and bed availability.   Megan Salon, MS, CCC-SLP Rehab Admissions Coordinator  [phone] (celll) [phone] (office)

## 2021-06-06 NOTE — Progress Notes (Signed)
PROGRESS NOTE    Allen Torres  CBU:[id number] DOB: 25-Jul-1940 DOA: 06/01/2021 PCP: Patient, No Pcp Per (Inactive)   Brief Narrative:  Patient is 81 year old male with past medical history of hypertension, chronic kidney disease, hyperlipidemia presented with left-sided weakness and dysarthria.  On arrival to the ER temp 98.7 heart rate 71 blood pressure 157/76. Labs show sodium 130 potassium 4.4 BUN of 31 creatinine 1.3. White count 6.1, hemoglobin 12.3, platelets 198. Urine drug abuse negative. COVID and flu swabs are negative. EKG showed normal sinus rhythm.  First-degree AV block. CT head demonstrated old small vessel infarctions of bilateral thalamus, basal ganglia/deep white matter.CT angio head demonstrated no intracranial lesions or high-grade stenosis.  EDP discussed the case with stroke neurology on-call who wanted the patient transferred to [facility].MRI brain + Now awaiting CIR bed availability and insurance authorization.    Assessment & Plan:  Acute right ischemic stroke: -Patient presented with left-sided weakness and slurred speech and facial drop. -MRI brain shows small area of acute/subacute ischemia in the right centrum semiovale.  No hemorrhage or mass effect.,  UA, UDS: Negative.  TSH: WNL -CT angio of head and neck shows no intracranial occlusion or stenosis. 2/18 MRI with small area of acute/subacute ischemia right centrum semiovale. No hemorrhage. Goal LDL less than 70>>>refused all antilipid medications  A1C 6.1 No antithrombotic prior to admission, now on aspirin and Plavix.  Per neurology continue DAPT for 3 weeks and then aspirin alone 2/21 disposition: CIR pending     Hyperlipidemia: Refused all meds including statins and Zetia for treatment    AKI: Likely prerenal was started on ivf initially 2/21 creatinine stabilizing, not sure if this is his new baseline now Encourage p.o. intake  Avoid nephrotoxic meds       Hypertension:  Has room  for improvement Continue carbidopa Resume home atenolol at a lower dose 25 mg daily increase as tolerated      Hyponatremia l likely secondary to chlorthalidone.   TSH normal  Now resolved  Avoid chlorthalidone on discharge     Hypomagnesemia:  Replenished.     Normocytic anemia:  H&H is stable.  Continue to monitor.   DVT prophylaxis: Heparin Code Status: DNR Family Communication: None at bedside   Disposition Plan: CIR   Consultants:  Neurology  Procedures:  None  Antimicrobials:  None  Status is: Inpatient Patient remains inpatient due to unsafe discharge which he needs bed and insurance authorization pending     Subjective: Eating lunch.  No shortness of breath or chest pain.  No dizziness   Objective: Vitals:   06/05/21 2328 06/06/21 0348 06/06/21 0743 06/06/21 1143  BP: (!) 146/80 124/66 132/89 133/81  Pulse: 84 85 80 89  Resp: 18 17 18 18   Temp: 98 F (36.7 C) 98.5 F (36.9 C) 98.8 F (37.1 C) 97.6 F (36.4 C)  TempSrc: Oral Oral Oral Oral  SpO2: 93% 94% 98% 95%  Weight:      Height:        Intake/Output Summary (Last 24 hours) at 06/06/2021 1405 Last data filed at 06/06/2021 0900 Gross per 24 hour  Intake 240 ml  Output --  Net 240 ml    Filed Weights   06/01/21 2114  Weight: 108.9 kg    Examination: Calm, NAD Cta no w/r Reg s1/s2 no gallop Soft benign +bs No edema Aaoxox3  Mood and affect appropriate in current setting    Data Reviewed: I have personally reviewed following labs and imaging  studies  CBC: Recent Labs  Lab 06/01/21 2102 06/01/21 2110 06/02/21 0211  WBC 6.1  --  6.5  NEUTROABS 3.3  --  3.3  HGB 12.3* 12.6* 11.6*  HCT 36.1* 37.0* 33.3*  MCV 98.1  --  96.2  PLT 198  --  0000000   Basic Metabolic Panel: Recent Labs  Lab 06/01/21 2102 06/01/21 2110 06/02/21 0211 06/03/21 0446 06/05/21 0841  NA 130* 133* 132* 136  --   K 4.4 4.5 4.1 4.4  --   CL 101 102 100 102  --   CO2 22  --  23 24  --    GLUCOSE 186* 184* 90 88  --   BUN 31* 26* 29* 24*  --   CREATININE 1.33* 1.40* 1.26* 1.59* 1.56*  CALCIUM 9.4  --  9.4 9.4  --   MG  --   --  1.6* 1.9  --    GFR: Estimated Creatinine Clearance: 47.4 mL/min (A) (by C-G formula based on SCr of 1.56 mg/dL (H)). Liver Function Tests: Recent Labs  Lab 06/01/21 2102 06/02/21 0211  AST 22 17  ALT 17 15  ALKPHOS 69 64  BILITOT 0.5 0.7  PROT 6.9 6.6  ALBUMIN 4.1 3.8   No results for input(s): LIPASE, AMYLASE in the last 168 hours. No results for input(s): AMMONIA in the last 168 hours. Coagulation Profile: Recent Labs  Lab 06/01/21 2102  INR 1.1   Cardiac Enzymes: No results for input(s): CKTOTAL, CKMB, CKMBINDEX, TROPONINI in the last 168 hours. BNP (last 3 results) No results for input(s): PROBNP in the last 8760 hours. HbA1C: No results for input(s): HGBA1C in the last 72 hours.  CBG: Recent Labs  Lab 06/01/21 2136  GLUCAP 146*   Lipid Profile: No results for input(s): CHOL, HDL, LDLCALC, TRIG, CHOLHDL, LDLDIRECT in the last 72 hours.  Thyroid Function Tests: No results for input(s): TSH, T4TOTAL, FREET4, T3FREE, THYROIDAB in the last 72 hours.  Anemia Panel: No results for input(s): VITAMINB12, FOLATE, FERRITIN, TIBC, IRON, RETICCTPCT in the last 72 hours. Sepsis Labs: No results for input(s): PROCALCITON, LATICACIDVEN in the last 168 hours.  Recent Results (from the past 240 hour(s))  Resp Panel by RT-PCR (Flu A&B, Covid) Nasopharyngeal Swab     Status: None   Collection Time: 06/01/21  9:40 PM   Specimen: Nasopharyngeal Swab; Nasopharyngeal(NP) swabs in vial transport medium  Result Value Ref Range Status   SARS Coronavirus 2 by RT PCR NEGATIVE NEGATIVE Final    Comment: (NOTE) SARS-CoV-2 target nucleic acids are NOT DETECTED.  The SARS-CoV-2 RNA is generally detectable in upper respiratory specimens during the acute phase of infection. The lowest concentration of SARS-CoV-2 viral copies this assay can  detect is 138 copies/mL. A negative result does not preclude SARS-Cov-2 infection and should not be used as the sole basis for treatment or other patient management decisions. A negative result may occur with  improper specimen collection/handling, submission of specimen other than nasopharyngeal swab, presence of viral mutation(s) within the areas targeted by this assay, and inadequate number of viral copies(<138 copies/mL). A negative result must be combined with clinical observations, patient history, and epidemiological information. The expected result is Negative.  Fact Sheet for Patients:  EntrepreneurPulse.com.au  Fact Sheet for Healthcare Providers:  IncredibleEmployment.be  This test is no t yet approved or cleared by the [place] FDA and  has been authorized for detection and/or diagnosis of SARS-CoV-2 by FDA under an Emergency Use Authorization (EUA). This EUA  will remain  in effect (meaning this test can be used) for the duration of the COVID-19 declaration under Section 564(b)(1) of the Act, 21 U.S.C.section 360bbb-3(b)(1), unless the authorization is terminated  or revoked sooner.       Influenza A by PCR NEGATIVE NEGATIVE Final   Influenza B by PCR NEGATIVE NEGATIVE Final    Comment: (NOTE) The Xpert Xpress SARS-CoV-2/FLU/RSV plus assay is intended as an aid in the diagnosis of influenza from Nasopharyngeal swab specimens and should not be used as a sole basis for treatment. Nasal washings and aspirates are unacceptable for Xpert Xpress SARS-CoV-2/FLU/RSV testing.  Fact Sheet for Patients: EntrepreneurPulse.com.au  Fact Sheet for Healthcare Providers: IncredibleEmployment.be  This test is not yet approved or cleared by the [place] FDA and has been authorized for detection and/or diagnosis of SARS-CoV-2 by FDA under an Emergency Use Authorization (EUA). This EUA will remain in  effect (meaning this test can be used) for the duration of the COVID-19 declaration under Section 564(b)(1) of the Act, 21 U.S.C. section 360bbb-3(b)(1), unless the authorization is terminated or revoked.  Performed at [facility], [place] [address] [place], [place] [address]       Radiology Studies: DG Swallowing Func-Speech Pathology  Result Date: 06/06/2021 Table formatting from the original result was not included. Objective Swallowing Evaluation: Type of Study: MBS-Modified Barium Swallow Study  Patient Details Name: Allen Torres MRN: [id number] Date of Birth: [birth year] Today's Date: 06/06/2021 Time: SLP Start Time (ACUTE ONLY): 53 -SLP Stop Time (ACUTE ONLY): 1253 SLP Time Calculation (min) (ACUTE ONLY): 13 min Past Medical History: Past Medical History: Diagnosis Date  Hypertension   Renal disorder   Patient states, "chronic kidney disease" Past Surgical History: Past Surgical History: Procedure Laterality Date  BIOPSY  03/16/2020  Procedure: BIOPSY;  Surgeon: Yetta Flock, MD;  Location: [facility];  Service: Gastroenterology;;  CHOLECYSTECTOMY N/A 03/18/2020  Procedure: LAPAROSCOPIC CHOLECYSTECTOMY;  Surgeon: Johnathan Hausen, MD;  Location: [facility];  Service: General;  Laterality: N/A;  ESOPHAGOGASTRODUODENOSCOPY N/A 03/16/2020  Procedure: ESOPHAGOGASTRODUODENOSCOPY (EGD);  Surgeon: Yetta Flock, MD;  Location: [facility];  Service: Gastroenterology;  Laterality: N/A;  facial cystectomy   HPI: Pt is an 81 yo male presenting wtih L facial droop and slurred speech. MRI showed small area of acute/subacute ischemia in the R centrum semiovale. PMH includes: HTN, renal disorder. Increased coughing at bedside therefore MBS recommended.  Subjective: emotional  Recommendations for follow up therapy are one component of a multi-disciplinary discharge planning process, led by the attending physician.  Recommendations may be updated based on patient status, additional  functional criteria and insurance authorization. Assessment / Plan / Recommendation Clinical Impressions 06/06/2021 Clinical Impression Pt exhibits mild oropharyngeal dysphagia with trace aspiration. Decreased containment resulted in sublingual loss with  re establish lingual position for bolus transit with liquid consistencies. Piecemeal transit pattern demonstrated with nectar. Solid bolus mastication was prolonged but complete without significant residue. Laryngeal closure was full however late with thin penetrated to cords and aspirate after without sensation to cough but occasional weak and non productive throat clear. Aspiration present during chin tuck trial. Coordinated and cohesive pharyngeal transit observed with nectar thick barium consistently. Therapist recommends nectar thick liquids, continue Dys 2 texture, pills crushed, and full fading to intermittent assist with meals. Trial of RMT may be attempted. SLP Visit Diagnosis Dysphagia, oropharyngeal phase (R13.12) Attention and concentration deficit following -- Frontal lobe and executive function deficit following -- Impact on safety and function Mild  aspiration risk;Moderate aspiration risk   Treatment Recommendations 06/06/2021 Treatment Recommendations Therapy as outlined in treatment plan below   Prognosis 06/06/2021 Prognosis for Safe Diet Advancement Good Barriers to Reach Goals -- Barriers/Prognosis Comment -- Diet Recommendations 06/06/2021 SLP Diet Recommendations Dysphagia 2 (Fine chop) solids;Nectar thick liquid Liquid Administration via Cup;Straw Medication Administration Crushed with puree Compensations Slow rate;Small sips/bites;Lingual sweep for clearance of pocketing Postural Changes Seated upright at 90 degrees   Other Recommendations 06/06/2021 Recommended Consults -- Oral Care Recommendations Oral care BID Other Recommendations -- Follow Up Recommendations Acute inpatient rehab (3hours/day) Assistance recommended at discharge Intermittent  Supervision/Assistance Functional Status Assessment Patient has had a recent decline in their functional status and demonstrates the ability to make significant improvements in function in a reasonable and predictable amount of time. Frequency and Duration  06/06/2021 Speech Therapy Frequency (ACUTE ONLY) min 2x/week Treatment Duration 2 weeks   Oral Phase 06/06/2021 Oral Phase Impaired Oral - Pudding Teaspoon -- Oral - Pudding Cup -- Oral - Honey Teaspoon -- Oral - Honey Cup -- Oral - Nectar Teaspoon -- Oral - Nectar Cup Piecemeal swallowing;Decreased bolus cohesion;Other (Comment) Oral - Nectar Straw -- Oral - Thin Teaspoon -- Oral - Thin Cup Decreased bolus cohesion Oral - Thin Straw -- Oral - Puree -- Oral - Mech Soft -- Oral - Regular Delayed oral transit Oral - Multi-Consistency -- Oral - Pill -- Oral Phase - Comment --  Pharyngeal Phase 06/06/2021 Pharyngeal Phase Impaired Pharyngeal- Pudding Teaspoon -- Pharyngeal -- Pharyngeal- Pudding Cup -- Pharyngeal -- Pharyngeal- Honey Teaspoon -- Pharyngeal -- Pharyngeal- Honey Cup -- Pharyngeal -- Pharyngeal- Nectar Teaspoon -- Pharyngeal -- Pharyngeal- Nectar Cup WFL Pharyngeal -- Pharyngeal- Nectar Straw WFL Pharyngeal -- Pharyngeal- Thin Teaspoon -- Pharyngeal -- Pharyngeal- Thin Cup Penetration/Aspiration during swallow;Compensatory strategies attempted (with notebox) Pharyngeal Material enters airway, CONTACTS cords and not ejected out;Material enters airway, passes BELOW cords without attempt by patient to eject out (silent aspiration) Pharyngeal- Thin Straw -- Pharyngeal -- Pharyngeal- Puree -- Pharyngeal -- Pharyngeal- Mechanical Soft -- Pharyngeal -- Pharyngeal- Regular WFL Pharyngeal -- Pharyngeal- Multi-consistency -- Pharyngeal -- Pharyngeal- Pill -- Pharyngeal -- Pharyngeal Comment --  Cervical Esophageal Phase  06/06/2021 Cervical Esophageal Phase WFL Pudding Teaspoon -- Pudding Cup -- Honey Teaspoon -- Honey Cup -- Nectar Teaspoon -- Nectar Cup -- Nectar  Straw -- Thin Teaspoon -- Thin Cup -- Thin Straw -- Puree -- Mechanical Soft -- Regular -- Multi-consistency -- Pill -- Cervical Esophageal Comment -- [name] 06/06/2021, 1:53 PM                      Scheduled Meds:   stroke: mapping our early stages of recovery book   Does not apply Once   aspirin EC  81 mg Oral Daily   atenolol  25 mg Oral Daily   clopidogrel  75 mg Oral Daily   doxazosin  1 mg Oral Q12H   heparin  5,000 Units Subcutaneous Q8H   Continuous Infusions:   LOS: 4 days   Time spent: 35 minutes   Nolberto Hanlon, MD [facility]  If 7PM-7AM, please contact night-coverage [email or web address] 06/06/2021, 2:05 PM

## 2021-06-06 NOTE — Progress Notes (Signed)
PMR Admission Coordinator Pre-Admission Assessment ° °Patient: Allen Torres is an 80 y.o., male °MRN: [id number] °DOB: 10/13/1940 °Height: 5' 11" (180.3 cm) °Weight: 108.9 kg ° °Insurance Information °HMO: yes    PPO:      PCP:      IPA:      80/20:      OTHER:  °PRIMARY: [employer]      Policy#: [id number]      Subscriber:  °CM Name: Patricia Espinoza      Phone#: [phone]     Fax#: [phone] °Pre-Cert#: [id number]      Employer: none I received approval from Dot Murphy from [employer] for admission 2/21. Approval good for 5 days, she requests weekly team conference notes be sent to Patricia Espinoza °Benefits:  Phone #:      Name:  °Eff Date: 04/16/2021- still active  °Deductible: does not have one  °OOP Max: $3,400 ($0 met)  °CIR: $295/day co-pay with a max co-pay of $1,770/admission (6 days)  °SNF: $0/day co-pay for days 1-20, $196/day co-pay for days 21-100, limited to 100 days/cal yr  °Outpatient:  $10-$20/visit co-pay  °Home Health:  100% coverage  °DME: 80% coverage; 20% co-insurance  °Providers: in network ° °SECONDARY: Generic Commercial      Policy#: [id number]     Phone#:  ° °Financial Counselor:       Phone#:  ° °The “Data Collection Information Summary” for patients in Inpatient Rehabilitation Facilities with attached “Privacy Act Statement-Health Care Records” was provided and verbally reviewed with: Patient ° °Emergency Contact Information °Contact Information   ° ° Name Relation Home Work Mobile  ° Moore,Jilda Daughter [phone]  [phone]  ° Dresser,Jaray Son   [phone]  ° °  ° ° °Current Medical History  °Patient Admitting Diagnosis: CVA °History of Present Illness: Patient is 80-year-old male with past medical history of hypertension, chronic kidney disease, hyperlipidemia presented with left-sided weakness and dysarthria. On arrival to the ER temp 98.7 heart rate 71 blood pressure 157/76. Labs show sodium 130 potassium 4.4 BUN of 31 creatinine 1.3. White count 6.1,  hemoglobin 12.3, platelets 198. Urine drug abuse negative. COVID and flu swabs are negative. EKG showed normal sinus rhythm.  First-degree AV block. CT head demonstrated old small vessel infarctions of bilateral thalamus, basal ganglia/deep white matter.CT angio head demonstrated no intracranial lesions or high-grade stenosis. MRI showed Small area of acute/subacute ischemia in the right centrum semiovale. No hemorrhage or mass effect. MRI also noted findings of chronic microvascular ischemia and generalized volume °loss.PT, OT, and SLP saw Pt. And recommended CIR to assist return to PLOF.  ° °Complete NIHSS TOTAL: 4 ° °Patient's medical record from [facility] has been reviewed by the rehabilitation admission coordinator and physician. ° °Past Medical History  °Past Medical History:  °Diagnosis Date  ° Hypertension   ° Renal disorder   ° Patient states, "chronic kidney disease"  ° ° °Has the patient had major surgery during 100 days prior to admission? Yes ° °Family History   °family history is not on file. ° °Current Medications ° °Current Facility-Administered Medications:  °   stroke: mapping our early stages of recovery book, , Does not apply, Once, Chen, Eric, DO °  acetaminophen (TYLENOL) tablet 650 mg, 650 mg, Oral, Q4H PRN **OR** acetaminophen (TYLENOL) 160 MG/5ML solution 650 mg, 650 mg, Per Tube, Q4H PRN **OR** acetaminophen (TYLENOL) suppository 650 mg, 650 mg, Rectal, Q4H PRN, Chen, Eric, DO °  aspirin EC tablet 81   TYLENOL) 160 MG/5ML solution 650 mg, 650 mg, Per Tube, Q4H PRN **OR** acetaminophen (TYLENOL) suppository 650 mg, 650 mg, Rectal, Q4H PRN, Kristopher Oppenheim, DO   aspirin EC tablet 81 mg, 81 mg, Oral, Daily, Rosalin Hawking, MD, 81 mg at 06/03/21 0934   clopidogrel (PLAVIX) tablet 75 mg, 75 mg, Oral, Daily, Rosalin Hawking, MD, 75 mg at 06/03/21 0934   doxazosin (CARDURA) tablet 2 mg, 2 mg, Oral, QPM, Kristopher Oppenheim, DO, 2 mg at 06/02/21 2210   heparin injection 5,000 Units, 5,000 Units, Subcutaneous, Q8H, Kristopher Oppenheim, DO, 5,000 Units at 06/03/21 1430   lactated ringers infusion, , Intravenous, Continuous, Amery, Sahar, MD, Last Rate: 75  mL/hr at 06/03/21 1428, New Bag at 06/03/21 1428   senna-docusate (Senokot-S) tablet 1 tablet, 1 tablet, Oral, QHS PRN, Kristopher Oppenheim, DO   Patients Current Diet:  Diet Order                  DIET DYS 2 Room service appropriate? No; Fluid consistency: Thin  Diet effective now                         Precautions / Restrictions Precautions Precautions: Fall Precaution Comments: left side weakness Restrictions Weight Bearing Restrictions: No    Has the patient had 2 or more falls or a fall with injury in the past year? Yes   Prior Activity Level Community (5-7x/wk): Pt. was active in the community PTA   Prior Functional Level Self Care: Did the patient need help bathing, dressing, using the toilet or eating? Independent   Indoor Mobility: Did the patient need assistance with walking from room to room (with or without device)? Independent   Stairs: Did the patient need assistance with internal or external stairs (with or without device)? Independent   Functional Cognition: Did the patient need help planning regular tasks such as shopping or remembering to take medications? Independent   Patient Information Are you of Hispanic, Latino/a,or Spanish origin?: A. No, not of Hispanic, Latino/a, or Spanish origin What is your race?: A. White Do you need or want an interpreter to communicate with a doctor or health care staff?: 0. No   Patient's Response To:  Health Literacy and Transportation Is the patient able to respond to health literacy and transportation needs?: Yes Health Literacy - How often do you need to have someone help you when you read instructions, pamphlets, or other written material from your doctor or pharmacy?: Often In the past 12 months, has lack of transportation kept you from medical appointments or from getting medications?: No In the past 12 months, has lack of transportation kept you from meetings, work, or from getting things needed for daily living?: No    Development worker, international aid / Equipment Home Equipment: None   Prior Device Use: Indicate devices/aids used by the patient prior to current illness, exacerbation or injury? Walker   Current Functional Level Cognition   Overall Cognitive Status: Impaired/Different from baseline Orientation Level: Oriented to person, Oriented to place, Oriented to situation General Comments: Patient having difficulty with motor planning getting out of bed. Also unable to problem solve navigating walker around bed running into frame possibly due to some left inattention    Extremity Assessment (includes Sensation/Coordination)   Upper Extremity Assessment: Defer to OT evaluation RUE Deficits / Details: AROM grossly intact biceps 4-/5 triceps 4/5 grip 4+/5 RUE Coordination: WNL LUE Deficits / Details: AROM grossly intact, biceps 3+/5, triceps 4-/5, grip 4/5  LUE Coordination: decreased fine motor  Lower Extremity Assessment: LLE deficits/detail LLE Deficits / Details: noted knee flexed with stance, decreased fine motor, upgoing toes with stimulation     ADLs   Overall ADL's : Needs assistance/impaired Grooming: Supervision/safety, Sitting Upper Body Bathing: Min guard, Sitting Lower Body Bathing: Maximal assistance, Sitting/lateral leans, Sit to/from stand Lower Body Bathing Details (indicate cue type and reason): due to impaired sitting and standing balance Upper Body Dressing : Min guard, Sitting Lower Body Dressing: Maximal assistance, Sitting/lateral leans, Sit to/from stand Lower Body Dressing Details (indicate cue type and reason): Have patient attempt donning socks has posterior loss of balance trying to don R sock and getting stuck on toe nails. Patient unable to complete figure 4 method to don L sock needing total A and again difficulty maintaining sitting balance even to lift foot up for OT to don sock. Toilet Transfer: Minimal assistance, Ambulation, Rolling walker (2 wheels) Toilet Transfer Details  (indicate cue type and reason): Patient min A throughout transfer and ambulation in room with walker for steadying assistance. Patient running walker into bed and appears to have some left inattention Toileting- Clothing Manipulation and Hygiene: Maximal assistance, Sit to/from stand Toileting - Clothing Manipulation Details (indicate cue type and reason): To don clean brief, limited standing balance needing to hold onto walker. Functional mobility during ADLs: Minimal assistance, Cueing for safety, Cueing for sequencing, Rolling walker (2 wheels) General ADL Comments: Patient needing increased assistance with self care tasks due to impaired balance, coordination, strength, safety awareness     Mobility   Overal bed mobility: Needs Assistance Bed Mobility: Supine to Sit, Sit to Supine Supine to sit: Max assist, HOB elevated Sit to supine: Mod assist General bed mobility comments: Cue patient to sit up at edge of bed. Appearing to have difficulty motor planning trying to sit up with legs on the bed. When cued to bring legs off edge having difficulty sequencing needing max A.     Transfers   Overall transfer level: Needs assistance Equipment used: Rolling walker (2 wheels) Transfers: Sit to/from Stand Sit to Stand: Min assist General transfer comment: noted decreased L hand grip on Rw, slipping off. Decreased LLE position/control  when stood up     Ambulation / Gait / Stairs / Wheelchair Mobility   Ambulation/Gait Ambulation/Gait assistance: Mod assist Gait Distance (Feet): 20 Feet Assistive device: Rolling walker (2 wheels) Gait Pattern/deviations: Step-to pattern, Step-through pattern, Staggering left, Drifts right/left General Gait Details: noted decreased swing/ control when stepping with Left leg, Left knee flexed in stance. Patient listing to the left when ambulating, required support of therapist Gait velocity: decr     Posture / Balance Dynamic Sitting Balance Sitting balance -  Comments: needing up to min A at times due to posterior loss of balance Balance Overall balance assessment: Needs assistance Sitting-balance support: Feet supported Sitting balance-Leahy Scale: Poor Sitting balance - Comments: needing up to min A at times due to posterior loss of balance Standing balance support: Bilateral upper extremity supported, During functional activity, Reliant on assistive device for balance Standing balance-Leahy Scale: Poor Standing balance comment: lateral lean to left     Special needs/care consideration Skin intact    Previous Home Environment (from acute therapy documentation) Living Arrangements: Children (daughter)  Lives With: Daughter Available Help at Discharge: Family Type of Home: House Home Layout: Able to live on main level with bedroom/bathroom Home Access: Stairs to enter, Ramped entrance Bathroom Shower/Tub: Chiropodist: Standard Bathroom Accessibility:  Spouse, Other (Comment) °Contact Information: [phone] °Anticipated Caregiver: Jilda Moore °Anticipated Caregiver's Contact Information: [phone] °Ability/Limitations of Caregiver: Can provide min A °Caregiver Availability: 24/7 °Discharge Plan Discussed with Primary Caregiver: Yes °Is Caregiver In Agreement with Plan?: No °Does Caregiver/Family have Issues with Lodging/Transportation while Pt is in Rehab?: Yes ° °Goals °Patient/Family Goal for Rehab: PT/OT/SLP Supervision °Expected length of stay: 8 -10  days °Pt/Family Agrees to Admission and willing to participate: Yes °Program Orientation Provided & Reviewed with Pt/Caregiver Including Roles  & Responsibilities: Yes ° °Decrease burden of Care through IP rehab admission: Specialzed equipment needs, Decrease number of caregivers, Bowel and bladder program, and Patient/family education ° °Possible need for SNF placement upon discharge: not anticipated  ° °Patient Condition: I have reviewed medical records from [facility], spoken with CM, and patient, son, and daughter. I met with patient at the bedside for inpatient rehabilitation assessment.  Patient will benefit from ongoing PT, OT, and SLP, can actively participate in 3 hours of therapy a day 5 days of the week, and can make measurable gains during the admission.  Patient will also benefit from the coordinated team approach during an Inpatient Acute Rehabilitation admission.  The patient will receive intensive therapy as well as Rehabilitation physician, nursing, social worker, and care management interventions.  Due to safety, skin/wound care, disease management, medication administration, and patient education the patient requires 24 hour a day rehabilitation nursing.  The patient is currently min A with mobility and basic ADLs.  Discharge setting and therapy post discharge at home with home health is anticipated.  Patient has agreed to participate in the Acute Inpatient Rehabilitation Program and will admit today. ° °Preadmission Screen Completed By:  Enza Shone B Javeion Cannedy, 06/03/2021 3:01 PM °______________________________________________________________________   °Discussed status with Dr. Raulkar on 06/06/2021 at 1519 and received approval for admission today. ° °Admission Coordinator:  Takirah Binford B Theodoros Stjames, CCC-SLP, time 1519/Date 06/06/2021  ° °Assessment/Plan: °Diagnosis: Ischemic stroke °Does the need for close, 24 hr/day Medical supervision in concert with the patient's rehab needs make it  unreasonable for this patient to be served in a less intensive setting? Yes °Co-Morbidities requiring supervision/potential complications: hyponatremia, HTN, facial droop, dysarthria, stage 3 CKD °Due to bladder management, bowel management, safety, skin/wound care, disease management, medication administration, pain management, and patient education, does the patient require 24 hr/day rehab nursing? Yes °Does the patient require coordinated care of a physician, rehab nurse, PT, OT, and SLP to address physical and functional deficits in the context of the above medical diagnosis(es)? Yes °Addressing deficits in the following areas: balance, endurance, locomotion, strength, transferring, bowel/bladder control, bathing, dressing, and feeding °Can the patient actively participate in an intensive therapy program of at least 3 hrs of therapy 5 days a week? Yes °The potential for patient to make measurable gains while on inpatient rehab is good °Anticipated functional outcomes upon discharge from inpatient rehab: supervision PT, supervision OT, supervision SLP °Estimated rehab length of stay to reach the above functional goals is: 10-14 days °Anticipated discharge destination: Home °10. Overall Rehab/Functional Prognosis: excellent ° ° °MD Signature: °Krutika Raulkar, MD °  Prognosis: excellent     MD Signature: Leeroy Cha, MD

## 2021-06-07 DIAGNOSIS — I639 Cerebral infarction, unspecified: Secondary | ICD-10-CM

## 2021-06-07 HISTORY — DX: Cerebral infarction, unspecified: I63.9

## 2021-06-07 LAB — CBC WITH DIFFERENTIAL/PLATELET
Abs Immature Granulocytes: 0.02 10*3/uL (ref 0.00–0.07)
Basophils Absolute: 0 10*3/uL (ref 0.0–0.1)
Basophils Relative: 1 %
Eosinophils Absolute: 0.3 10*3/uL (ref 0.0–0.5)
Eosinophils Relative: 4 %
HCT: 40.2 % (ref 39.0–52.0)
Hemoglobin: 14.5 g/dL (ref 13.0–17.0)
Immature Granulocytes: 0 %
Lymphocytes Relative: 23 %
Lymphs Abs: 1.5 10*3/uL (ref 0.7–4.0)
MCH: 34.1 pg — ABNORMAL HIGH (ref 26.0–34.0)
MCHC: 36.1 g/dL — ABNORMAL HIGH (ref 30.0–36.0)
MCV: 94.6 fL (ref 80.0–100.0)
Monocytes Absolute: 0.7 10*3/uL (ref 0.1–1.0)
Monocytes Relative: 11 %
Neutro Abs: 3.9 10*3/uL (ref 1.7–7.7)
Neutrophils Relative %: 61 %
Platelets: 232 10*3/uL (ref 150–400)
RBC: 4.25 MIL/uL (ref 4.22–5.81)
RDW: 14.1 % (ref 11.5–15.5)
WBC: 6.4 10*3/uL (ref 4.0–10.5)
nRBC: 0 % (ref 0.0–0.2)

## 2021-06-07 LAB — COMPREHENSIVE METABOLIC PANEL
ALT: 31 U/L (ref 0–44)
AST: 42 U/L — ABNORMAL HIGH (ref 15–41)
Albumin: 4.3 g/dL (ref 3.5–5.0)
Alkaline Phosphatase: 73 U/L (ref 38–126)
Anion gap: 12 (ref 5–15)
BUN: 30 mg/dL — ABNORMAL HIGH (ref 8–23)
CO2: 21 mmol/L — ABNORMAL LOW (ref 22–32)
Calcium: 10.1 mg/dL (ref 8.9–10.3)
Chloride: 100 mmol/L (ref 98–111)
Creatinine, Ser: 1.62 mg/dL — ABNORMAL HIGH (ref 0.61–1.24)
GFR, Estimated: 43 mL/min — ABNORMAL LOW (ref >60)
Glucose, Bld: 144 mg/dL — ABNORMAL HIGH (ref 70–99)
Potassium: 4.4 mmol/L (ref 3.5–5.1)
Sodium: 133 mmol/L — ABNORMAL LOW (ref 135–145)
Total Bilirubin: 0.9 mg/dL (ref 0.3–1.2)
Total Protein: 7.3 g/dL (ref 6.5–8.1)

## 2021-06-07 MED ORDER — PROCHLORPERAZINE 25 MG RE SUPP: 12.5000 mg | Freq: Four times a day (QID) | RECTAL | Status: DC | PRN

## 2021-06-07 MED ORDER — SORBITOL 70 % SOLN
30.0000 mL | Freq: Every day | Status: DC | PRN
  Administered 2021-06-07 – 2021-06-13 (×3): 30 mL via ORAL
  Filled 2021-06-07 (×3): qty 30

## 2021-06-07 MED ORDER — PROCHLORPERAZINE EDISYLATE 10 MG/2ML IJ SOLN: 5.0000 mg | Freq: Four times a day (QID) | INTRAMUSCULAR | Status: DC | PRN

## 2021-06-07 MED ORDER — HEPARIN SODIUM (PORCINE) 5000 UNIT/ML IJ SOLN: 5000.0000 [IU] | Freq: Three times a day (TID) | INTRAMUSCULAR | Status: DC

## 2021-06-07 MED ORDER — GUAIFENESIN-DM 100-10 MG/5ML PO SYRP
5.0000 mL | ORAL_SOLUTION | Freq: Four times a day (QID) | ORAL | Status: DC | PRN
  Administered 2021-06-09 – 2021-06-12 (×5): 10 mL via ORAL
  Filled 2021-06-07 (×5): qty 10

## 2021-06-07 MED ORDER — METHOCARBAMOL 500 MG PO TABS: 500.0000 mg | ORAL_TABLET | Freq: Four times a day (QID) | ORAL | Status: DC | PRN

## 2021-06-07 MED ORDER — ALUM & MAG HYDROXIDE-SIMETH 200-200-20 MG/5ML PO SUSP: 30.0000 mL | ORAL | Status: DC | PRN

## 2021-06-07 MED ORDER — TRAZODONE HCL 50 MG PO TABS
25.0000 mg | ORAL_TABLET | Freq: Every evening | ORAL | Status: DC | PRN
  Administered 2021-06-13: 50 mg via ORAL
  Filled 2021-06-07 (×2): qty 1

## 2021-06-07 MED ORDER — SENNA 8.6 MG PO TABS
1.0000 | ORAL_TABLET | Freq: Two times a day (BID) | ORAL | Status: DC
Start: 2021-06-07 — End: 2021-06-14
  Administered 2021-06-07 – 2021-06-14 (×12): 8.6 mg via ORAL
  Filled 2021-06-07 (×14): qty 1

## 2021-06-07 MED ORDER — FLEET ENEMA 7-19 GM/118ML RE ENEM: 1.0000 | ENEMA | Freq: Once | RECTAL | Status: DC | PRN

## 2021-06-07 MED ORDER — ACETAMINOPHEN 325 MG PO TABS
325.0000 mg | ORAL_TABLET | ORAL | Status: DC | PRN
  Administered 2021-06-13: 650 mg via ORAL
  Filled 2021-06-07 (×2): qty 2

## 2021-06-07 MED ORDER — POLYETHYLENE GLYCOL 3350 17 G PO PACK
17.0000 g | PACK | Freq: Every day | ORAL | Status: DC | PRN
  Administered 2021-06-07: 17 g via ORAL
  Filled 2021-06-07: qty 1

## 2021-06-07 MED ORDER — PROCHLORPERAZINE MALEATE 5 MG PO TABS: 5.0000 mg | ORAL_TABLET | Freq: Four times a day (QID) | ORAL | Status: DC | PRN

## 2021-06-07 MED ORDER — HEPARIN SODIUM (PORCINE) 5000 UNIT/ML IJ SOLN
5000.0000 [IU] | Freq: Three times a day (TID) | INTRAMUSCULAR | Status: DC
  Administered 2021-06-07 – 2021-06-14 (×21): 5000 [IU] via SUBCUTANEOUS
  Filled 2021-06-07 (×21): qty 1

## 2021-06-07 NOTE — Progress Notes (Signed)
Inpatient Rehabilitation  Patient information reviewed and entered into eRehab system by Latana Colin Parker Wherley, OTR/L.   Information including medical coding, functional ability and quality indicators will be reviewed and updated through discharge.    

## 2021-06-07 NOTE — Evaluation (Signed)
Occupational Therapy Assessment and Plan  Patient Details  Name: Allen Torres MRN: [id number] Date of Birth: 08-20-1940  OT Diagnosis: cognitive deficits, hemiplegia affecting non-dominant side, and muscle weakness (generalized) Rehab Potential: Rehab Potential (ACUTE ONLY): Good ELOS: 7-10 days   Today's Date: 06/07/2021 OT Individual Time: 0900-1000 OT Individual Time Calculation (min): 60 min     Hospital Problem: Principal Problem:   Ischemic stroke ([place]) Active Problems:   Acute embolic stroke [facility])   Past Medical History:  Past Medical History:  Diagnosis Date   Hypertension    Renal disorder    Patient states, "chronic kidney disease"   Past Surgical History:  Past Surgical History:  Procedure Laterality Date   BIOPSY  03/16/2020   Procedure: BIOPSY;  Surgeon: Yetta Flock, MD;  Location: [facility];  Service: Gastroenterology;;   CHOLECYSTECTOMY N/A 03/18/2020   Procedure: LAPAROSCOPIC CHOLECYSTECTOMY;  Surgeon: Johnathan Hausen, MD;  Location: [facility];  Service: General;  Laterality: N/A;   ESOPHAGOGASTRODUODENOSCOPY N/A 03/16/2020   Procedure: ESOPHAGOGASTRODUODENOSCOPY (EGD);  Surgeon: Yetta Flock, MD;  Location: [facility];  Service: Gastroenterology;  Laterality: N/A;   facial cystectomy      Assessment & Plan Clinical Impression: Allen Torres is an 81 year old male in his usual state of good health until the late afternoon of 06/01/2021.  He lives with his daughter who came home from work and noted he had slurred speech and left facial droop.  He also had left upper extremity weakness.  He presented to [facility] emergency department where code stroke was called.  He was out of the window for thrombolytics.  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  The patient has a history of chronic kidney disease and creatinine appears to  be elevated above baseline.  He is placed on dual antiplatelet therapy and will continue Plavix and aspirin for 3 weeks then aspirin alone.    He also endorses obstructive bladder symptoms and constipation.  Has significant hearing loss but has not been able to use hearing aids.  He is currently tolerating dysphagia 3 diet with nectar thick liquids.  His CODE STATUS is DNR. The patient requires inpatient medicine and rehabilitation evaluations and services for ongoing dysfunction secondary to right corona radiata infarct.   The patient lives with his daughter who works full-time.  He states he was not requiring aids for ambulation prior to admission. Patient transferred to CIR on 06/06/2021 .    Patient currently requires min with basic self-care skills secondary to muscle weakness, decreased cardiorespiratoy endurance, unbalanced muscle activation, decreased coordination, and decreased motor planning, decreased visual perceptual skills, decreased attention to left, decreased awareness, decreased problem solving, decreased safety awareness, and delayed processing, and decreased standing balance, decreased postural control, hemiplegia, and decreased balance strategies.  Prior to hospitalization, patient could complete ADLs with independent .  Patient will benefit from skilled intervention to decrease level of assist with basic self-care skills prior to discharge home with care partner.  Anticipate patient will require 24 hour supervision and follow up home health.  OT - End of Session Activity Tolerance: Tolerates 10 - 20 min activity with multiple rests Endurance Deficit: Yes Endurance Deficit Description: generalized weakness OT Assessment Rehab Potential (ACUTE ONLY): Good OT Barriers to Discharge: Home environment access/layout OT Barriers to Discharge Comments: Stairs to enter OT Patient demonstrates impairments in the following area(s):  Balance;Cognition;Vision;Endurance;Motor;Pain;Safety;Perception OT Basic ADL's Functional  Problem(s): Grooming;Dressing;Bathing;Toileting OT Transfers Functional Problem(s): Toilet;Tub/Shower OT Additional Impairment(s): Fuctional Use of Upper Extremity OT Plan OT Intensity: Minimum of 1-2 x/day, 45 to 90 minutes OT Frequency: 5 out of 7 days OT Duration/Estimated Length of Stay: 7-10 days OT Treatment/Interventions: Balance/vestibular training;Neuromuscular re-education;Cognitive remediation/compensation;DME/adaptive equipment instruction;Pain management;Skin care/wound managment;Self Care/advanced ADL retraining;Therapeutic Exercise;UE/LE Strength taining/ROM;UE/LE Coordination activities;Patient/family education;Discharge planning;Functional mobility training;Psychosocial support;Therapeutic Activities;Visual/perceptual remediation/compensation;Community reintegration;Disease mangement/prevention;Wheelchair propulsion/positioning OT Self Feeding Anticipated Outcome(s): (S) OT Basic Self-Care Anticipated Outcome(s): (S) OT Toileting Anticipated Outcome(s): (S) OT Bathroom Transfers Anticipated Outcome(s): (S) OT Recommendation Recommendations for Other Services: Neuropsych consult;Speech consult Patient destination: Home Follow Up Recommendations: Home health OT Equipment Recommended: Tub/shower bench   OT Evaluation Precautions/Restrictions  Precautions Precautions: Fall Precaution Comments: left side weakness, dysarthric Restrictions Weight Bearing Restrictions: No General Chart Reviewed: Yes Family/Caregiver Present: No Vital Signs  Pain Pain Assessment Pain Scale: 0-10 Pain Score: 0-No pain Home Living/Prior Functioning Home Living Family/patient expects to be discharged to:: Private residence Living Arrangements: Children Available Help at Discharge: Family Type of Home: House Home Access: Stairs to enter, Ramped entrance [employer] of Steps: 3 steps to  enter- says the ramp is in bad shape, son may fix it Home Layout: Able to live on main level with bedroom/bathroom Bathroom Shower/Tub: Chiropodist: Standard Bathroom Accessibility: Yes  Lives With: Daughter IADL History Homemaking Responsibilities: Yes Meal Prep Responsibility: Primary Laundry Responsibility: Primary Cleaning Responsibility: Primary Bill Paying/Finance Responsibility: Primary Current License: Yes Mode of Transportation: Car Occupation: Retired Type of Occupation: Retired, has been pretty depressed since his wife died 8 years ago and especially the last year has been in a funk Prior Function  Able to Take Stairs?: Yes Driving: Yes Vocation: Retired Surveyor, mining Baseline Vision/History: 1 Wears glasses Ability to See in Adequate Light: 1 Impaired Patient Visual Report: No change from baseline Vision Assessment?: Yes Eye Alignment: Within Functional Limits Ocular Range of Motion: Within Functional Limits Tracking/Visual Pursuits: Decreased smoothness of horizontal tracking;Decreased smoothness of vertical tracking (fatigue with tracking) Saccades: Undershoots Perception  Perception: Impaired Inattention/Neglect: Does not attend to left side of body Praxis Praxis: Impaired Praxis Impairment Details: Motor planning;Perseveration Cognition Overall Cognitive Status: Impaired/Different from baseline Arousal/Alertness: Awake/alert Orientation Level: Place;Situation;Person Person: Oriented Place: Oriented Situation: Oriented Year: 2023 Month: February Day of Week: Correct Memory: Appears intact Immediate Memory Recall: Sock;Blue;Bed Memory Recall Sock: Without Cue Memory Recall Blue: With Cue Memory Recall Bed: Without Cue Attention: Sustained Sustained Attention: Impaired Sustained Attention Impairment: Verbal complex;Functional complex Awareness: Impaired Awareness Impairment: Emergent impairment Problem Solving: Appears  intact Safety/Judgment: Impaired Comments: Safety awareness impaired Sensation Sensation Light Touch: Appears Intact Hot/Cold: Appears Intact Proprioception: Impaired Detail Proprioception Impaired Details: Impaired LUE;Impaired LLE Coordination Gross Motor Movements are Fluid and Coordinated: No Fine Motor Movements are Fluid and Coordinated: No Coordination and Movement Description: L hemi Finger Nose Finger Test: WFL, slower on L Motor  Motor Motor: Hemiplegia Motor - Skilled Clinical Observations: L hemi  Trunk/Postural Assessment  Cervical Assessment Cervical Assessment: Within Functional Limits Thoracic Assessment Thoracic Assessment: Within Functional Limits Lumbar Assessment Lumbar Assessment: Exceptions to [facility] (posterior pelvic tilt) Postural Control Postural Control: Deficits on evaluation (delayed and inadequate)  Balance Balance Balance Assessed: Yes Static Sitting Balance Static Sitting - Balance Support: Feet supported Static Sitting - Level of Assistance: 5: Stand by assistance Dynamic Sitting Balance Dynamic Sitting - Balance Support: Feet supported Dynamic Sitting - Level of Assistance: 5: Stand by assistance Static Standing Balance Static Standing - Balance Support: During functional  activity;No upper extremity supported Static Standing - Level of Assistance: 4: Min assist Dynamic Standing Balance Dynamic Standing - Balance Support: During functional activity;No upper extremity supported Dynamic Standing - Level of Assistance: 4: Min assist Extremity/Trunk Assessment RUE Assessment RUE Assessment: Within Functional Limits LUE Assessment LUE Assessment: Exceptions to [facility] Active Range of Motion (AROM) Comments: 3/5 MMT, uncoordinated  Care Tool Care Tool Self Care Eating   Eating Assist Level: Supervision/Verbal cueing    Oral Care    Oral Care Assist Level: Supervision/Verbal cueing    Bathing   Body parts bathed by patient: Left  arm;Chest;Abdomen;Front perineal area;Buttocks;Left upper leg;Right upper leg;Left lower leg;Right lower leg;Face Body parts bathed by helper: Right arm   Assist Level: Minimal Assistance - Patient > 75%    Upper Body Dressing(including orthotics)   What is the patient wearing?: Pull over shirt   Assist Level: Minimal Assistance - Patient > 75%    Lower Body Dressing (excluding footwear)   What is the patient wearing?: Pants;Incontinence brief Assist for lower body dressing: Minimal Assistance - Patient > 75%    Putting on/Taking off footwear   What is the patient wearing?: Non-skid slipper socks Assist for footwear: Minimal Assistance - Patient > 75%       Care Tool Toileting Toileting activity   Assist for toileting: Moderate Assistance - Patient 50 - 74%     Care Tool Bed Mobility Roll left and right activity   Roll left and right assist level: Contact Guard/Touching assist    Sit to lying activity Sit to lying activity did not occur: N/A Sit to lying assist level: Contact Guard/Touching assist    Lying to sitting on side of bed activity Lying to sitting on side of bed activity did not occur: the ability to move from lying on the back to sitting on the side of the bed with no back support.: N/A Lying to sitting on side of bed assist level: the ability to move from lying on the back to sitting on the side of the bed with no back support.: Contact Guard/Touching assist     Care Tool Transfers Sit to stand transfer   Sit to stand assist level: Minimal Assistance - Patient > 75%    Chair/bed transfer   Chair/bed transfer assist level: Minimal Assistance - Patient > 75%     Toilet transfer   Assist Level: Minimal Assistance - Patient > 75%     Care Tool Cognition  Expression of Ideas and Wants Expression of Ideas and Wants: 3. Some difficulty - exhibits some difficulty with expressing needs and ideas (e.g, some words or finishing thoughts) or speech is not clear   Understanding Verbal and Non-Verbal Content Understanding Verbal and Non-Verbal Content: 2. Sometimes understands - understands only basic conversations or simple, direct phrases. Frequently requires cues to understand   Memory/Recall Ability Memory/Recall Ability : That he or she is in a hospital/hospital unit   Refer to Care Plan for Long Term Goals  SHORT TERM GOAL WEEK 1 OT Short Term Goal 1 (Week 1): Pt will don shirt with (S) OT Short Term Goal 2 (Week 1): Pt will complete a toilet transfer with CGA OT Short Term Goal 3 (Week 1): Pt will complete toileting tasks with CGA OT Short Term Goal 4 (Week 1): Pt will complete LB dressing with (S)  Recommendations for other services: Neuropsych   Skilled Therapeutic Intervention ADL ADL Eating: Set up;Supervision/safety Where Assessed-Eating: Bed level Grooming: Minimal assistance Where Assessed-Grooming: Sitting  at sink Upper Body Bathing: Minimal assistance;Minimal cueing Where Assessed-Upper Body Bathing: Sitting at sink Lower Body Bathing: Minimal assistance;Minimal cueing Where Assessed-Lower Body Bathing: Sitting at sink Upper Body Dressing: Minimal assistance Where Assessed-Upper Body Dressing: Sitting at sink Lower Body Dressing: Minimal assistance Where Assessed-Lower Body Dressing: Sitting at sink;Standing at sink Toileting: Moderate assistance Where Assessed-Toileting: Glass blower/designer: Minimal assistance;Minimal Research officer, trade union Method: Counselling psychologist: Energy manager: Unable to assess Tub/Shower Transfer Method: Unable to assess Mobility  Bed Mobility Bed Mobility: Sit to Supine;Supine to Sit Supine to Sit: Contact Guard/Touching assist Sit to Supine: Contact Guard/Touching assist Transfers Sit to Stand: Contact Guard/Touching assist Stand to Sit: Contact Guard/Touching assist  Skilled OT evaluation completed. Education provided on ELOS, OT POC, and CVA  education. Pt dysarthric but able to be understood by OT, occasional cueing for attention to articulation and pacing. Pt emotional re condition, emotional support provided. ADLs performed as described above. Cueing required for impulsivity and safety awareness. Cueing for L inattention. Pt passed off to SLP in room.   Discharge Criteria: Patient will be discharged from OT if patient refuses treatment 3 consecutive times without medical reason, if treatment goals not met, if there is a change in medical status, if patient makes no progress towards goals or if patient is discharged from hospital.  The above assessment, treatment plan, treatment alternatives and goals were discussed and mutually agreed upon: by patient  Curtis Sites 06/07/2021, 10:06 AM

## 2021-06-07 NOTE — Progress Notes (Signed)
Inpatient Rehabilitation Care Coordinator Assessment and Plan Patient Details  Name: Allen Torres MRN: [id number] Date of Birth: 1940-12-27  Today's Date: 06/07/2021  Hospital Problems: Principal Problem:   Ischemic stroke [facility]) Active Problems:   Acute embolic stroke [facility])  Past Medical History:  Past Medical History:  Diagnosis Date   Hypertension    Renal disorder    Patient states, "chronic kidney disease"   Past Surgical History:  Past Surgical History:  Procedure Laterality Date   BIOPSY  03/16/2020   Procedure: BIOPSY;  Surgeon: Benancio Deeds, MD;  Location: [facility];  Service: Gastroenterology;;   CHOLECYSTECTOMY N/A 03/18/2020   Procedure: LAPAROSCOPIC CHOLECYSTECTOMY;  Surgeon: Luretha Murphy, MD;  Location: [facility];  Service: General;  Laterality: N/A;   ESOPHAGOGASTRODUODENOSCOPY N/A 03/16/2020   Procedure: ESOPHAGOGASTRODUODENOSCOPY (EGD);  Surgeon: Benancio Deeds, MD;  Location: [facility];  Service: Gastroenterology;  Laterality: N/A;   facial cystectomy     Social History:  reports that he has quit smoking. He has never used smokeless tobacco. He reports that he does not drink alcohol and does not use drugs.  Family / Support Systems Marital Status: Widow/Widower Patient Roles: Parent, Other (Comment) (sibling) Children: Jilda-daughter [phone]-cell [phone]  Drevin-son [phone]-cell Gail-daughter Other Supports: Sister's who will assist Anticipated Caregiver: Three children will rotate and pull together to assist. Ability/Limitations of Caregiver: All work but will take vacation time-daughter whom lives with pt works during the day Caregiver Availability: Other (Comment) (Short time 24/7 then evenings) Family Dynamics: Close knit family who will pull together to assist Dad. Made aware he will need 24/7 supervision due to safety and high risk to fall  Social History Preferred language: English Religion: Other Cultural Background: No  issues Education: HS Health Literacy - How often do you need to have someone help you when you read instructions, pamphlets, or other written material from your doctor or pharmacy?: Often Writes: Yes Employment Status: Retired Marine scientist Issues: No issues Guardian/Conservator: None-according to MD pt is capable of making his own decisions while here. Children will be here daily to visit also   Abuse/Neglect Abuse/Neglect Assessment Can Be Completed: Yes Physical Abuse: Denies Verbal Abuse: Denies Sexual Abuse: Denies Exploitation of patient/patient's resources: Denies Self-Neglect: Denies  Patient response to: Social Isolation - How often do you feel lonely or isolated from those around you?: Never  Emotional Status Pt's affect, behavior and adjustment status: Pt is motivated to do well and regain his independence, he does not like to depend upon others and would rather do it himself. He is hard headed and stubborn that way he admits to this Recent Psychosocial Issues: other health issues Psychiatric History: No history may benefit from neuro-psych doing a cognitve eval since had memory issues prior to admission. Pt is concerned about his memory issues also Substance Abuse History: No issues  Patient / Family Perceptions, Expectations & Goals Pt/Family understanding of illness & functional limitations: Pt and son can explain his stroke and the deficits he has. Both have spoken with the MD and feel they understand his treatment plan moving forward. Pt is hopeful he will do well here Premorbid pt/family roles/activities: Father, grandfather, retiree, sibling, church member, etc Anticipated changes in roles/activities/participation: resume Pt/family expectations/goals: Pt states: " I want to do for myself, I'm not one to ask anyone for help."  Son states: " I know he will try to do on his own and we all need to be aware of this and his needs."  [employer]  Resources [employer]: None Premorbid Home Care/DME Agencies: Other (Comment) (cane) Transportation available at discharge: Children Is the patient able to respond to transportation needs?: Yes In the past 12 months, has lack of transportation kept you from medical appointments or from getting medications?: No In the past 12 months, has lack of transportation kept you from meetings, work, or from getting things needed for daily living?: No Resource referrals recommended: Neuropsychology  Discharge Planning Living Arrangements: Children Support Systems: Children, Other relatives, Friends/neighbors, Psychologist, clinical community Type of Residence: Private residence Insurance Resources: Media planner (specify) ([employer] Medicare) Financial Resources: Social Security, Family Support Financial Screen Referred: No Living Expenses: Own Money Management: Family Does the patient have any problems obtaining your medications?: No Home Management: Daughter does the home management Patient/Family Preliminary Plans: Return home with daughter who lives with him. She does work during the day. His other two children will assist with care at discharge. made aware he will need 24/7 supervision level when he returns home from here. Aware team is evaluating him and setting goals, he does move well but is not safe at times. Care Coordinator Barriers to Discharge: Insurance for SNF coverage Care Coordinator Anticipated Follow Up Needs: HH/OP  Clinical Impression Pleasant gentleman who wants to return to his independent level before going home. He is aware of his balance issues and will work on this while here. Discussed working on his swallowing also. Currently on nectar thick liquids. Son reports they will work together on his discharge plan for home  Lucy Chris 06/07/2021, 2:21 PM

## 2021-06-07 NOTE — Progress Notes (Signed)
Inpatient Rehabilitation Admission Medication Review by a Pharmacist  A complete drug regimen review was completed for this patient to identify any potential clinically significant medication issues.  High Risk Drug Classes Is patient taking? Indication by Medication  Antipsychotic No   Anticoagulant No   Antibiotic No   Opioid No   Antiplatelet Yes Aspirin, plavix- CVA prophylaxis  Hypoglycemics/insulin No   Vasoactive Medication Yes Atenolol, doxazosin- hypertension, BPH  Chemotherapy No   Other No      Type of Medication Issue Identified Description of Issue Recommendation(s)  Drug Interaction(s) (clinically significant)     Duplicate Therapy     Allergy     No Medication Administration End Date     Incorrect Dose     Additional Drug Therapy Needed     Significant med changes from prior encounter (inform family/care partners about these prior to discharge).    Other  PTA meds: Allopurinol, chlorthalidone, lisinopril Restart PTA meds when and if clinically necessary during CIR admission or at time of discharge    Clinically significant medication issues were identified that warrant physician communication and completion of prescribed/recommended actions by midnight of the next day:  No   Time spent performing this drug regimen review (minutes):  30   Allen Torres BS, PharmD, BCPS Clinical Pharmacist 06/07/2021 8:06 AM

## 2021-06-07 NOTE — Progress Notes (Signed)
Inpatient Rehabilitation Center Individual Statement of Services  Patient Name:  Allen Torres  Date:  06/07/2021  Welcome to the Inpatient Rehabilitation Center.  Our goal is to provide you with an individualized program based on your diagnosis and situation, designed to meet your specific needs.  With this comprehensive rehabilitation program, you will be expected to participate in at least 3 hours of rehabilitation therapies Monday-Friday, with modified therapy programming on the weekends.  Your rehabilitation program will include the following services:  Physical Therapy (PT), Occupational Therapy (OT), Speech Therapy (ST), 24 hour per day rehabilitation nursing, Neuropsychology, Care Coordinator, Rehabilitation Medicine, Nutrition Services, and Pharmacy Services  Weekly team conferences will be held on Wednesday to discuss your progress.  Your Inpatient Rehabilitation Care Coordinator will talk with you frequently to get your input and to update you on team discussions.  Team conferences with you and your family in attendance may also be held.  Expected length of stay: 7-10 days  Overall anticipated outcome: supervision level-some cueing  Depending on your progress and recovery, your program may change. Your Inpatient Rehabilitation Care Coordinator will coordinate services and will keep you informed of any changes. Your Inpatient Rehabilitation Care Coordinator's name and contact numbers are listed  below.  The following services may also be recommended but are not provided by the Inpatient Rehabilitation Center:   Home Health Rehabiltiation Services Outpatient Rehabilitation Services    Arrangements will be made to provide these services after discharge if needed.  Arrangements include referral to agencies that provide these services.  Your insurance has been verified to be:  [employer] Your primary doctor is:  Tonny Bollman  Pertinent information will be shared with your doctor  and your insurance company.  Inpatient Rehabilitation Care Coordinator:  Dossie Der, [place] [phone] or Luna Glasgow  Information discussed with and copy given to patient by: Lucy Chris, 06/07/2021, 2:22 PM

## 2021-06-07 NOTE — H&P (Signed)
Physical Medicine and Rehabilitation Admission H&P    CC: Functional deficits secondary to ischemic stroke  HPI: Allen Torres is an 81 year old male in his usual state of good health until the late afternoon of 06/01/2021.  He lives with his daughter who came home from work and noted he had slurred speech and left facial droop.  He also had left upper extremity weakness.  He presented to [facility] emergency department where code stroke was called.  He was out of the window for thrombolytics.  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  The patient has a history of chronic kidney disease and creatinine appears to be elevated above baseline.  He is placed on dual antiplatelet therapy and will continue Plavix and aspirin for 3 weeks then aspirin alone.   He also endorses obstructive bladder symptoms and constipation.  Has significant hearing loss but has not been able to use hearing aids.  He is currently tolerating dysphagia 3 diet with nectar thick liquids.  His CODE STATUS is DNR. The patient requires inpatient medicine and rehabilitation evaluations and services for ongoing dysfunction secondary to right corona radiata infarct.  The patient lives with his daughter who works full-time.  He states he was not requiring aids for ambulation prior to admission. Uses allopurinol chronically for gout.   Review of Systems  HENT:  Positive for hearing loss.        No hearing aids due to expense. Left ear is worse. Edentulous>>eats all textures without his dentures>>ill fitting  Eyes:  Negative for blurred vision and double vision.  Respiratory:  Negative for cough, hemoptysis and shortness of breath.   Cardiovascular:  Negative for chest pain.  Gastrointestinal:  Positive for constipation. Negative for abdominal pain, nausea and vomiting.       Endorses significant constipation.  History hemorrhoids.  Has had bleeding from rectum after using a Fleet enema.  Genitourinary:        + urinary hesitancy, "prostate problems"  Musculoskeletal:  Positive for back pain and joint pain. Negative for falls.  Neurological:  Positive for focal weakness. Negative for headaches.  Past Medical History:  Diagnosis Date   Hypertension    Renal disorder    Patient states, "chronic kidney disease"   Past Surgical History:  Procedure Laterality Date   BIOPSY  03/16/2020   Procedure: BIOPSY;  Surgeon: Yetta Flock, MD;  Location: [facility];  Service: Gastroenterology;;   CHOLECYSTECTOMY N/A 03/18/2020   Procedure: LAPAROSCOPIC CHOLECYSTECTOMY;  Surgeon: Johnathan Hausen, MD;  Location: [facility];  Service: General;  Laterality: N/A;   ESOPHAGOGASTRODUODENOSCOPY N/A 03/16/2020   Procedure: ESOPHAGOGASTRODUODENOSCOPY (EGD);  Surgeon: Yetta Flock, MD;  Location: [facility];  Service: Gastroenterology;  Laterality: N/A;   facial cystectomy     Family History  Problem Relation Age of Onset   Stroke Mother    Cancer Father    Lupus Sister    Social History:  reports that he has quit smoking. He has never used smokeless tobacco. He reports that he does not drink alcohol and does not use drugs. Allergies:  Allergies  Allergen Reactions   Colchicine Diarrhea   Hydrochlorothiazide     Other reaction(s): Other (See Comments) Loose stools   Indomethacin     "messed up my liver" Other reaction(s): Other (See Comments) Patient states that this messed up his liver     Statins Other (  See Comments)    Patient states that he hurt so bad, hard to get up in the morning.  Felt like his system was shut down and under able to have a bowel movement   Medications Prior to Admission  Medication Sig Dispense Refill   Ascorbic Acid (VITAMIN C) 1000 MG tablet Take 1,000 mg by mouth daily.     aspirin EC 81 MG EC tablet Take 1 tablet (81 mg total) by mouth daily. Swallow whole. 30  tablet 11   atenolol (TENORMIN) 25 MG tablet Take 1 tablet (25 mg total) by mouth daily.     bisacodyl (DULCOLAX) 10 MG suppository Place 1 suppository (10 mg total) rectally daily as needed for moderate constipation. 12 suppository 0   cholecalciferol (VITAMIN D3) 25 MCG (1000 UNIT) tablet Take 1,000 Units by mouth daily.     clopidogrel (PLAVIX) 75 MG tablet Take 1 tablet (75 mg total) by mouth daily for 21 days. 21 tablet 0   doxazosin (CARDURA) 1 MG tablet Take 1 tablet (1 mg total) by mouth every 12 (twelve) hours.     fluticasone (FLONASE) 50 MCG/ACT nasal spray Place 1 spray into both nostrils daily as needed for allergies.      meclizine (ANTIVERT) 12.5 MG tablet Take 1 tablet (12.5 mg total) by mouth 3 (three) times daily as needed for dizziness. 30 tablet 0   senna-docusate (SENOKOT-S) 8.6-50 MG tablet Take 1 tablet by mouth at bedtime as needed for mild constipation.     vitamin B-12 (CYANOCOBALAMIN) 100 MCG tablet Take 100 mcg by mouth daily.     zinc gluconate 50 MG tablet Take 50 mg by mouth daily.      Home: Home Living Family/patient expects to be discharged to:: Private residence Living Arrangements: Children Available Help at Discharge: Family Type of Home: House Home Access: Stairs to enter, Ramped entrance Home Layout: Able to live on main level with bedroom/bathroom Bathroom Shower/Tub: Chiropodist: Standard Bathroom Accessibility: Yes Home Equipment: None  Lives With: Daughter   Functional History: Prior Function Prior Level of Function : Independent/Modified Independent   Functional Status:  Mobility: Bed Mobility Overal bed mobility: Needs Assistance Bed Mobility: Supine to Sit Supine to sit: HOB elevated, Min assist Sit to supine: Mod assist General bed mobility comments: verbal cues to use L UE, HOB elevated, increased time Transfers Overall transfer level: Needs assistance Equipment used: Rolling walker (2 wheels) Transfers: Sit  to/from Stand Sit to Stand: Min assist General transfer comment: minA to power up, verbal cues for safe hand placement, no carry over despite 3 trials of sit to stand Ambulation/Gait Ambulation/Gait assistance: Min assist Gait Distance (Feet): 160 Feet Assistive device: Rolling walker (2 wheels) Gait Pattern/deviations: Step-through pattern, Decreased stride length General Gait Details: pt with improved ability to stay in middle of walker versus L side of walker. With onset of fatigue pt would begin to have difficulty clearing L foot requiring verbal cues to continue to lift foot and not slid it across the floor Gait velocity: decr Gait velocity interpretation: <1.31 ft/sec, indicative of household ambulator   ADL: ADL Overall ADL's : Needs assistance/impaired Grooming: Wash/dry hands, Minimal assistance, Wash/dry face, Standing Grooming Details (indicate cue type and reason): Patient stood at sink and required assistance with turning on hot water with LUE Upper Body Bathing: Min guard, Sitting Lower Body Bathing: Maximal assistance, Sitting/lateral leans, Sit to/from stand Lower Body Bathing Details (indicate cue type and reason): due to impaired sitting and standing  balance Upper Body Dressing : Min guard, Sitting Lower Body Dressing: Maximal assistance, Sitting/lateral leans, Sit to/from stand Lower Body Dressing Details (indicate cue type and reason): Have patient attempt donning socks has posterior loss of balance trying to don R sock and getting stuck on toe nails. Patient unable to complete figure 4 method to don L sock needing total A and again difficulty maintaining sitting balance even to lift foot up for OT to don sock. Toilet Transfer: Minimal assistance, Ambulation, Rolling walker (2 wheels) Toilet Transfer Details (indicate cue type and reason): min assist due to assistance maneuvering RW in bathroom Toileting- Clothing Manipulation and Hygiene: Maximal assistance, Sit to/from  stand Toileting - Clothing Manipulation Details (indicate cue type and reason): To don clean brief, limited standing balance needing to hold onto walker. Functional mobility during ADLs: Minimal assistance, Cueing for safety, Cueing for sequencing, Rolling walker (2 wheels) General ADL Comments: Patient attempted to use LUE to assist with turning on water and washing face   Cognition: Cognition Overall Cognitive Status: Impaired/Different from baseline Orientation Level: Oriented X4 Cognition Arousal/Alertness: Awake/alert Behavior During Therapy: WFL for tasks assessed/performed Overall Cognitive Status: Impaired/Different from baseline Area of Impairment: Problem solving, Following commands, Memory, Attention Current Attention Level: Sustained Memory: Decreased short-term memory Following Commands: Follows one step commands with increased time Problem Solving: Difficulty sequencing, Slow processing General Comments: pt with improved spatial awareness today. difficulty problem solving on how to get shoes on when L hand wasn't working  Physical Exam: Blood pressure (!) 155/54, pulse 70, temperature 97.8 F (36.6 C), resp. rate 16, height 5\' 9"  (1.753 m), weight 91.5 kg, SpO2 96 %. Physical Exam Gen: no distress, normal appearing HEENT: oral mucosa pink and moist, NCAT Cardio: Reg rate Chest: normal effort, normal rate of breathing Abd: soft, non-distended Ext: no edema Psych: pleasant, normal affect Skin: intact Neurological:     Mental Status: He is alert.     Coordination: Coordination abnormal.     Comments: Left facial droop, slurred speech. LUE clumsiness. Right side strength 5/5, left side 4/5. Sensation intact bilaterally.     No results found for this or any previous visit (from the past 48 hour(s)).  DG Swallowing Func-Speech Pathology  Result Date: 06/06/2021 Table formatting from the original result was not included. Objective Swallowing Evaluation: Type of  Study: MBS-Modified Barium Swallow Study  Patient Details Name: CHRYSTOPHER BUNTIN MRN: [id number] Date of Birth: [birth year] Today's Date: 06/06/2021 Time: SLP Start Time (ACUTE ONLY): 26 -SLP Stop Time (ACUTE ONLY): 1253 SLP Time Calculation (min) (ACUTE ONLY): 13 min Past Medical History: Past Medical History: Diagnosis Date  Hypertension   Renal disorder   Patient states, "chronic kidney disease" Past Surgical History: Past Surgical History: Procedure Laterality Date  BIOPSY  03/16/2020  Procedure: BIOPSY;  Surgeon: Yetta Flock, MD;  Location: [facility];  Service: Gastroenterology;;  CHOLECYSTECTOMY N/A 03/18/2020  Procedure: LAPAROSCOPIC CHOLECYSTECTOMY;  Surgeon: Johnathan Hausen, MD;  Location: [facility];  Service: General;  Laterality: N/A;  ESOPHAGOGASTRODUODENOSCOPY N/A 03/16/2020  Procedure: ESOPHAGOGASTRODUODENOSCOPY (EGD);  Surgeon: Yetta Flock, MD;  Location: [facility];  Service: Gastroenterology;  Laterality: N/A;  facial cystectomy   HPI: Pt is an 81 yo male presenting wtih L facial droop and slurred speech. MRI showed small area of acute/subacute ischemia in the R centrum semiovale. PMH includes: HTN, renal disorder. Increased coughing at bedside therefore MBS recommended.  Subjective: emotional  Recommendations for follow up therapy are one component of a multi-disciplinary discharge planning process,  led by the attending physician.  Recommendations may be updated based on patient status, additional functional criteria and insurance authorization. Assessment / Plan / Recommendation Clinical Impressions 06/06/2021 Clinical Impression Pt exhibits mild oropharyngeal dysphagia with trace aspiration. Decreased containment resulted in sublingual loss with  re establish lingual position for bolus transit with liquid consistencies. Piecemeal transit pattern demonstrated with nectar. Solid bolus mastication was prolonged but complete without significant residue. Laryngeal closure was full however  late with thin penetrated to cords and aspirate after without sensation to cough but occasional weak and non productive throat clear. Aspiration present during chin tuck trial. Coordinated and cohesive pharyngeal transit observed with nectar thick barium consistently. Therapist recommends nectar thick liquids, continue Dys 2 texture, pills crushed, and full fading to intermittent assist with meals. Trial of RMT may be attempted. SLP Visit Diagnosis Dysphagia, oropharyngeal phase (R13.12) Attention and concentration deficit following -- Frontal lobe and executive function deficit following -- Impact on safety and function Mild aspiration risk;Moderate aspiration risk   Treatment Recommendations 06/06/2021 Treatment Recommendations Therapy as outlined in treatment plan below   Prognosis 06/06/2021 Prognosis for Safe Diet Advancement Good Barriers to Reach Goals -- Barriers/Prognosis Comment -- Diet Recommendations 06/06/2021 SLP Diet Recommendations Dysphagia 2 (Fine chop) solids;Nectar thick liquid Liquid Administration via Cup;Straw Medication Administration Crushed with puree Compensations Slow rate;Small sips/bites;Lingual sweep for clearance of pocketing Postural Changes Seated upright at 90 degrees   Other Recommendations 06/06/2021 Recommended Consults -- Oral Care Recommendations Oral care BID Other Recommendations -- Follow Up Recommendations Acute inpatient rehab (3hours/day) Assistance recommended at discharge Intermittent Supervision/Assistance Functional Status Assessment Patient has had a recent decline in their functional status and demonstrates the ability to make significant improvements in function in a reasonable and predictable amount of time. Frequency and Duration  06/06/2021 Speech Therapy Frequency (ACUTE ONLY) min 2x/week Treatment Duration 2 weeks   Oral Phase 06/06/2021 Oral Phase Impaired Oral - Pudding Teaspoon -- Oral - Pudding Cup -- Oral - Honey Teaspoon -- Oral - Honey Cup -- Oral - Nectar  Teaspoon -- Oral - Nectar Cup Piecemeal swallowing;Decreased bolus cohesion;Other (Comment) Oral - Nectar Straw -- Oral - Thin Teaspoon -- Oral - Thin Cup Decreased bolus cohesion Oral - Thin Straw -- Oral - Puree -- Oral - Mech Soft -- Oral - Regular Delayed oral transit Oral - Multi-Consistency -- Oral - Pill -- Oral Phase - Comment --  Pharyngeal Phase 06/06/2021 Pharyngeal Phase Impaired Pharyngeal- Pudding Teaspoon -- Pharyngeal -- Pharyngeal- Pudding Cup -- Pharyngeal -- Pharyngeal- Honey Teaspoon -- Pharyngeal -- Pharyngeal- Honey Cup -- Pharyngeal -- Pharyngeal- Nectar Teaspoon -- Pharyngeal -- Pharyngeal- Nectar Cup WFL Pharyngeal -- Pharyngeal- Nectar Straw WFL Pharyngeal -- Pharyngeal- Thin Teaspoon -- Pharyngeal -- Pharyngeal- Thin Cup Penetration/Aspiration during swallow;Compensatory strategies attempted (with notebox) Pharyngeal Material enters airway, CONTACTS cords and not ejected out;Material enters airway, passes BELOW cords without attempt by patient to eject out (silent aspiration) Pharyngeal- Thin Straw -- Pharyngeal -- Pharyngeal- Puree -- Pharyngeal -- Pharyngeal- Mechanical Soft -- Pharyngeal -- Pharyngeal- Regular WFL Pharyngeal -- Pharyngeal- Multi-consistency -- Pharyngeal -- Pharyngeal- Pill -- Pharyngeal -- Pharyngeal Comment --  Cervical Esophageal Phase  06/06/2021 Cervical Esophageal Phase WFL Pudding Teaspoon -- Pudding Cup -- Honey Teaspoon -- Honey Cup -- Nectar Teaspoon -- Nectar Cup -- Nectar Straw -- Thin Teaspoon -- Thin Cup -- Thin Straw -- Puree -- Mechanical Soft -- Regular -- Multi-consistency -- Pill -- Cervical Esophageal Comment -- [name] 06/06/2021, 1:53 PM  Blood pressure (!) 155/54, pulse 70, temperature 97.8 F (36.6 C), resp. rate 16, height 5\' 9"  (1.753 m), weight 91.5 kg, SpO2 96 %.  Medical Problem List and Plan: 1. Functional deficits secondary to right CR infarct likely due to small vessel disease  -patient may  shower  -ELOS/Goals: S 5-7 days  Initial CIR evaluations today 2.  Antithrombotics: -DVT/anticoagulation:  Pharmaceutical: Heparin  -antiplatelet therapy: Plavix and aspirin 81 mg for 3 weeks then aspirin alone 3. Pain Management: Tylenol 4. Mood: LCSW to evaluate and provide emotional support  -antipsychotic agents: n/a 5. Neuropsych: This patient is capable of making decisions on his own behalf. 6. Skin/Wound Care: Routine skin care checks 7. Fluids/Electrolytes/Nutrition: routine Is and Os and follow-up chemistries 8. 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 Follow-up BMP in AM. 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  Allopurinol held, discussed avoiding soda products/added sugar.  14. BPH: start flomax 0.4mg  HS 15. Hypomagnesemia: check magnesium level tomorrow morning.   I have personally performed a face to face diagnostic evaluation, including, but not limited to relevant history and physical exam findings, of this patient and developed relevant assessment and plan.  Additionally, I have reviewed and concur with the physician assistant's documentation above.  Izora Ribas, MD 06/07/2021   Risa Grill, PA

## 2021-06-07 NOTE — Patient Care Conference (Signed)
Inpatient RehabilitationTeam Conference and Plan of Care Update Date: 06/07/2021   Time: 11:00 AM    Patient Name: Allen Torres      Medical Record Number: [id number]  Date of Birth: [birth year] Sex: Male         Room/Bed: 4W07C/4W07C-01 Payor Info: Payor: [employer] MEDICARE / Plan: [employer] MEDICARE HMO / Product Type: *No Product type* /    Admit Date/Time:  06/06/2021  5:57 PM  Primary Diagnosis:  Ischemic stroke [facility])  Hospital Problems: Principal Problem:   Ischemic stroke [facility]) Active Problems:   Acute embolic stroke [facility])    Expected Discharge Date: Expected Discharge Date: 06/14/21 (evals pending)  Team Members Present: Physician leading conference: Dr. Sula Soda Social Worker Present: Dossie Der, LCSW Nurse Present: Chana Bode, RN PT Present: Wynelle Link, PT OT Present: Dolphus Jenny, OT SLP Present: Colin Benton, SLP PPS Coordinator present : Fae Pippin, SLP     Current Status/Progress Goal Weekly Team Focus  Bowel/Bladder     Patient with constipation and urinary retention   Continent of bowel and bladder   Hx of BPH; refusing I+O caths, Constipation addressed.   Swallow/Nutrition/ Hydration   Eval pending  Eval pending      ADL's             Mobility             Communication   Eval pending  Eval pending      Safety/Cognition/ Behavioral Observations  Eval pending  Eval pending      Pain     N/A        Skin     N/A          Discharge Planning:  New evalution today-home with family will need to confirm discharge plan and amount of care family an provide at DC   Team Discussion: Patient with urinary retention; hx of BPH, refusing I+O caths and has constipation. MD addressed BPH and constipation with hypo-magnesium and adjusted meds.  Patient on target to meet rehab goals: Evals pending. Currently needs min assist overall. Completes transfers with CGA. Needs cures for safety due to left inattention. Was able to  ambulate up to  160' with min assist but limited by fatigue and left foot drop.  *See Care Plan and progress notes for long and short-term goals.   Revisions to Treatment Plan:  N/A   Teaching Needs: Safety, transfers, toileting, medications, secondary risk management, etc  Current Barriers to Discharge: Decreased caregiver support  Possible Resolutions to Barriers: Family education with daughter     Medical Summary Current Status: gout, urinary retention, BPH, CKD, elevated SBP and soft DBP, left inattention, hypomagnesemia, overweight  Barriers to Discharge: Behavior;Decreased family/caregiver support;Neurogenic Bowel & Bladder;Weight;Medical stability  Barriers to Discharge Comments: gout, urinary retention, BPH, CKD, elevated SBP and soft DBP, left inattention, hypomagnesemia, overweight Possible Resolutions to [employer]: reviewed medications and labs with him, start flomax, start bladded program, start magnesium supplement, continue to monitor SBP and DBP.   Continued Need for Acute Rehabilitation Level of Care: The patient requires daily medical management by a physician with specialized training in physical medicine and rehabilitation for the following reasons: Direction of a multidisciplinary physical rehabilitation program to maximize functional independence : Yes Medical management of patient stability for increased activity during participation in an intensive rehabilitation regime.: Yes Analysis of laboratory values and/or radiology reports with any subsequent need for medication adjustment and/or medical intervention. : Yes   I attest that I  was present, lead the team conference, and concur with the assessment and plan of the team.   Pamelia Hoit 06/07/2021, 4:33 PM

## 2021-06-07 NOTE — Progress Notes (Addendum)
Nurse notified that pt making comments that wanting to harm self. Notified PA, notified SW. Had conversation with patient. He has no wishes to harm himself. Pt is very frustrated. He reports that has not had a BM in some time (prior to admission) and having difficultly peeing (both past and present). Informed that we are bladder scanning and do need to cath when volume is over certain amount. He reports that had a bad experience previously and doesn't want to be cath'd. Informed him that there may be discomfort but will feel much better. Provided PRN medications for bowels and will toilet.

## 2021-06-07 NOTE — Progress Notes (Signed)
Occupational Therapy Session Note  Patient Details  Name: Allen Torres MRN: [id number] Date of Birth: Aug 12, 1940  Today's Date: 06/07/2021 OT Individual Time: 1501-1531 OT Individual Time Calculation (min): 30 min    Short Term Goals: Week 1:  OT Short Term Goal 1 (Week 1): Pt will don shirt with (S) OT Short Term Goal 2 (Week 1): Pt will complete a toilet transfer with CGA OT Short Term Goal 3 (Week 1): Pt will complete toileting tasks with CGA OT Short Term Goal 4 (Week 1): Pt will complete LB dressing with (S)  Skilled Therapeutic Interventions/Progress Updates:  Pt greeted supine in bed c/o that BSC was too high and upset about nectar thick liquids. Education provided on swallowing precautions and OTA retrieved lower BSC. Pt upset that he feels like he doesn't get any privacy when urinating, education provided on using privacy curtain as needed to respect pts dignity. Pt completed supine>sit with CGA and MIN A to rise from EOB, pt needed tactile and verbal cues for LUE placement on RW. Pt completed stand pivot to [facility] with pt reporting height was much better, stand pivot back to EOB with RW and MINA. Issued pt compliant foam cube for LUE to work on functional grasp and hand strength. Encouraged pt to work on squeezing cube and to work on passing cube from R<>L hand to facilitate improved coordination. pt left supine in bed with all needs within reach.                     Therapy Documentation Precautions:  Precautions Precautions: Fall Precaution Comments: left side weakness, dysarthric Restrictions Weight Bearing Restrictions: No  Pain: pt reports stomach pain from constipation, rest breaks provided as needed and offered to allow pt to try to void bowels.     Therapy/Group: Individual Therapy  Barron Schmid 06/07/2021, 3:49 PM

## 2021-06-07 NOTE — Progress Notes (Signed)
Patient ID: Allen Torres, male   DOB: 07/06/1940, 81 y.o.   MRN: [id number] Met with the patient to introduce self, role, rehab process, team conference and plan of care. Reported leery of medications due to poor tolerance. Discussed medications ordered, referred to MD to continue discussion. Patient noted he has asked daughter to contact general medicine MD as he is comfortable with her opinion and recommendations. Discussed effects of stroke, having difficulty dealing with limitations on the left; tearful at times. Patient had not voided since last PM. Discussed options for emptying bladder and he is also constipated. Has a history of prostate issues and reported pain with I+O cath a few days ago. Assisted patient to toilet, able to void on his own.Discussed secondary risk management and dietary modification recommendations. Continue to follow along to discharge to address educational needs and collaborate with the team to facilitate preparation for discharge. Allen Torres

## 2021-06-07 NOTE — Evaluation (Signed)
Physical Therapy Assessment and Plan  Patient Details  Name: Allen Torres MRN: [id number] Date of Birth: 01-19-1941  PT Diagnosis: Abnormal posture, Abnormality of gait, Cognitive deficits, Difficulty walking, Hemiplegia non-dominant, and Hypotonia Rehab Potential: Excellent ELOS: 7-10 days   Today's Date: 06/07/2021 PT Individual Time: 1300-1400 PT Individual Time Calculation (min): 60 min    Hospital Problem: Principal Problem:   Ischemic stroke [facility]) Active Problems:   Acute embolic stroke [facility])   Past Medical History:  Past Medical History:  Diagnosis Date   Hypertension    Renal disorder    Patient states, "chronic kidney disease"   Past Surgical History:  Past Surgical History:  Procedure Laterality Date   BIOPSY  03/16/2020   Procedure: BIOPSY;  Surgeon: Yetta Flock, MD;  Location: [facility];  Service: Gastroenterology;;   CHOLECYSTECTOMY N/A 03/18/2020   Procedure: LAPAROSCOPIC CHOLECYSTECTOMY;  Surgeon: Johnathan Hausen, MD;  Location: [facility];  Service: General;  Laterality: N/A;   ESOPHAGOGASTRODUODENOSCOPY N/A 03/16/2020   Procedure: ESOPHAGOGASTRODUODENOSCOPY (EGD);  Surgeon: Yetta Flock, MD;  Location: [facility];  Service: Gastroenterology;  Laterality: N/A;   facial cystectomy      Assessment & Plan Clinical Impression: Patient is a 81 y.o. year old male in his usual state of good health until the late afternoon of 06/01/2021.  He lives with his daughter who came home from work and noted he had slurred speech and left facial droop.  He also had left upper extremity weakness.  He presented to [facility] emergency department where code stroke was called.  He was out of the window for thrombolytics.  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  The patient has a history of chronic kidney disease and creatinine appears to be  elevated above baseline.  He is placed on dual antiplatelet therapy and will continue Plavix and aspirin for 3 weeks then aspirin alone.    He also endorses obstructive bladder symptoms and constipation.  Has significant hearing loss but has not been able to use hearing aids.  He is currently tolerating dysphagia 3 diet with nectar thick liquids.  His CODE STATUS is DNR. The patient requires inpatient medicine and rehabilitation evaluations and services for ongoing dysfunction secondary to right corona radiata infarct. Patient transferred to [facility] on 06/06/2021 .   Patient currently requires min with mobility secondary to muscle weakness, decreased cardiorespiratoy endurance, abnormal tone and decreased coordination, decreased visual motor skills, decreased attention to left, decreased attention, decreased awareness, and decreased safety awareness, and decreased sitting balance, decreased standing balance, decreased postural control, hemiplegia, and decreased balance strategies.  Prior to hospitalization, patient was independent  with mobility and lived with Daughter in a House home.  Home access is 3 steps to enter- says the ramp is in bad shape, son may fix itStairs to enter, Ramped entrance.  Patient will benefit from skilled PT intervention to maximize safe functional mobility, minimize fall risk, and decrease caregiver burden for planned discharge home with 24 hour supervision.  Anticipate patient will benefit from follow up OP at discharge.  PT - End of Session Activity Tolerance: Tolerates 30+ min activity with multiple rests Endurance Deficit: Yes Endurance Deficit Description: Requires seated rest breaks between activities PT Assessment Rehab Potential (ACUTE/IP ONLY): Excellent PT Barriers to Discharge: Decreased caregiver support;Nutrition means;Behavior PT Patient demonstrates impairments in the following area(s): Balance;Perception;Behavior;Safety;Sensory;Edema;Endurance;Skin  Integrity;Motor;Nutrition;Pain PT Transfers Functional Problem(s): Bed Mobility;Bed  to Chair;Car;Furniture PT Locomotion Functional Problem(s): Ambulation;Wheelchair Mobility;Stairs PT Plan PT Intensity: Minimum of 1-2 x/day ,45 to 90 minutes PT Frequency: 5 out of 7 days PT Duration Estimated Length of Stay: 7-10 days PT Treatment/Interventions: Ambulation/gait training;Cognitive remediation/compensation;Discharge planning;DME/adaptive equipment instruction;Functional mobility training;Pain management;Psychosocial support;Splinting/orthotics;Therapeutic Activities;UE/LE Strength taining/ROM;Visual/perceptual remediation/compensation;Wheelchair propulsion/positioning;UE/LE Coordination activities;Therapeutic Exercise;Stair training;Skin care/wound management;Patient/family education;Neuromuscular re-education;Functional electrical stimulation;Disease management/prevention;Community reintegration;Balance/vestibular training PT Transfers Anticipated Outcome(s): mod I using LRAD PT Locomotion Anticipated Outcome(s): supervision using LRAD PT Recommendation Recommendations for Other Services: Therapeutic Recreation consult Therapeutic Recreation Interventions: Stress management Follow Up Recommendations: Outpatient PT Patient destination: Home Equipment Recommended: Rolling walker with 5" wheels   PT Evaluation Precautions/Restrictions Precautions Precautions: Fall Precaution Comments: left side weakness, dysarthric Restrictions Weight Bearing Restrictions: No General   Vital SignsTherapy Vitals Temp: 98 F (36.7 C) Temp Source: Oral Pulse Rate: 81 Resp: 16 BP: 133/82 Patient Position (if appropriate): Lying Oxygen Therapy SpO2: 94 % O2 Device: Room Air Pain   Pain Interference Pain Interference Pain Effect on Sleep: 0. Does not apply - I have not had any pain or hurting in the past 5 days Pain Interference with Therapy Activities: 0. Does not apply - I have not received  rehabilitationtherapy in the past 5 days Pain Interference with Day-to-Day Activities: 1. Rarely or not at all Home Living/Prior [place] Living Arrangements: Children Available Help at Discharge: Family Type of Home: House Home Access: Stairs to enter;Ramped entrance Entrance Stairs-Number of Steps: 3 steps to enter- says the ramp is in bad shape, son may fix it Entrance Stairs-Rails: None Home Layout: Able to live on main level with bedroom/bathroom (has steps to the attic, reports he does not go up there)  Lives With: Daughter Prior Function Level of Independence: Independent with basic ADLs;Independent with gait;Independent with transfers  Able to Take Stairs?: Yes Driving: Yes Vocation: Retired Vision/Perception  Vision - History Ability to See in Adequate Light: 0 Adequate Vision - Assessment Eye Alignment: Within Functional Limits Ocular Range of Motion: Within Functional Limits Tracking/Visual Pursuits: Decreased smoothness of horizontal tracking;Decreased smoothness of vertical tracking;Requires cues, head turns, or add eye shifts to track Saccades: Undershoots Perception Inattention/Neglect: Does not attend to left side of body Praxis Praxis Impairment Details: Motor planning;Perseveration  Cognition Overall Cognitive Status: Impaired/Different from baseline Arousal/Alertness: Awake/alert Orientation Level: Oriented X4 Year: 2023 Month: February Attention: Sustained Sustained Attention: Impaired Sustained Attention Impairment: Verbal complex;Functional complex Memory: Appears intact Awareness: Impaired Awareness Impairment: Emergent impairment Problem Solving: Appears intact Behaviors: Impulsive Safety/Judgment: Impaired Sensation Sensation Light Touch: Appears Intact Hot/Cold: Appears Intact Proprioception: Impaired Detail Proprioception Impaired Details: Impaired LUE;Impaired LLE Coordination Gross Motor Movements are Fluid and  Coordinated: No Fine Motor Movements are Fluid and Coordinated: No Coordination and Movement Description: L hemi Motor  Motor Motor: Hemiplegia Motor - Skilled Clinical Observations: L hemi   Trunk/Postural Assessment  Cervical Assessment Cervical Assessment: Within Functional Limits Thoracic Assessment Thoracic Assessment: Within Functional Limits Lumbar Assessment Lumbar Assessment: Exceptions to [facility] (posterior pelvic tilt) Postural Control Postural Control: Deficits on evaluation (delayed and inadequate)  Balance Standardized Balance Assessment Standardized Balance Assessment: Berg Balance Test Berg Balance Test Sit to Stand: Needs minimal aid to stand or to stabilize Standing Unsupported: Needs several tries to stand 30 seconds unsupported Sitting with Back Unsupported but Feet Supported on Floor or Stool: Able to sit safely and securely 2 minutes Stand to Sit: Needs assistance to sit Transfers: Needs one person to assist Standing Unsupported with Eyes Closed: Needs help to keep from  falling Standing Ubsupported with Feet Together: Needs help to attain position and unable to hold for 15 seconds From Standing, Reach Forward with Outstretched Arm: Loses balance while trying/requires external support From Standing Position, Pick up Object from Floor: Unable to try/needs assist to keep balance From Standing Position, Turn to Look Behind Over each Shoulder: Needs assist to keep from losing balance and falling Turn 360 Degrees: Needs assistance while turning Standing Unsupported, Alternately Place Feet on Step/Stool: Needs assistance to keep from falling or unable to try Standing Unsupported, One Foot in Front: Loses balance while stepping or standing Standing on One Leg: Unable to try or needs assist to prevent fall Total Score: 7 Static Sitting Balance Static Sitting - Balance Support: No upper extremity supported;Feet supported Static Sitting - Level of Assistance: 5: Stand by  assistance Dynamic Sitting Balance Dynamic Sitting - Balance Support: During functional activity;Feet supported;No upper extremity supported Dynamic Sitting - Level of Assistance: 5: Stand by assistance Static Standing Balance Static Standing - Balance Support: During functional activity;No upper extremity supported Static Standing - Level of Assistance: 4: Min assist Dynamic Standing Balance Dynamic Standing - Balance Support: During functional activity;No upper extremity supported Dynamic Standing - Level of Assistance: 4: Min assist Extremity Assessment  RLE Assessment RLE Assessment: Within Functional Limits Active Range of Motion (AROM) Comments: Grossly WFL with functional mobility General Strength Comments: Grossly 5/5 throughout in sitting LLE Assessment LLE Assessment: Exceptions to [facility] Active Range of Motion (AROM) Comments: Grossly WFL with functional mobility General Strength Comments: Grossly 4-4+/5 throughout  Care Tool Care Tool Bed Mobility Roll left and right activity   Roll left and right assist level: Supervision/Verbal cueing    Sit to lying activity   Sit to lying assist level: Supervision/Verbal cueing    Lying to sitting on side of bed activity   Lying to sitting on side of bed assist level: the ability to move from lying on the back to sitting on the side of the bed with no back support.: Contact Guard/Touching assist     Care Tool Transfers Sit to stand transfer   Sit to stand assist level: Minimal Assistance - Patient > 75%    Chair/bed transfer   Chair/bed transfer assist level: Minimal Assistance - Patient > 75%     Toilet transfer   Assist Level: Minimal Assistance - Patient > 75%    Car transfer   Car transfer assist level: Minimal Assistance - Patient > 75%      Care Tool Locomotion Ambulation   Assist level: Minimal Assistance - Patient > 75% Assistive device: No Device Max distance: 400 ft  Walk 10 feet activity   Assist level:  Minimal Assistance - Patient > 75% Assistive device: No Device   Walk 50 feet with 2 turns activity   Assist level: Minimal Assistance - Patient > 75% Assistive device: No Device  Walk 150 feet activity   Assist level: Minimal Assistance - Patient > 75% Assistive device: No Device  Walk 10 feet on uneven surfaces activity   Assist level: Minimal Assistance - Patient > 75%    Stairs   Assist level: Minimal Assistance - Patient > 75% Stairs assistive device: 1 hand rail Max number of stairs: 8  Walk up/down 1 step activity   Walk up/down 1 step (curb) assist level: Moderate Assistance - Patient - 50 - 74% Walk up/down 1 step or curb assistive device: No device  Walk up/down 4 steps activity   Walk up/down 4 steps  assist level: Minimal Assistance - Patient > 75% Walk up/down 4 steps assistive device: 1 hand rail  Walk up/down 12 steps activity   Walk up/down 12 steps assist level: Minimal Assistance - Patient > 75% Walk up/down 12 steps assistive device: 1 hand rail  Pick up small objects from floor   Pick up small object from the floor assist level: Moderate Assistance - Patient 50 - 74%    Wheelchair Is the patient using a wheelchair?: Yes Type of Wheelchair: Manual   Wheelchair assist level: Minimal Assistance - Patient > 75% Max wheelchair distance: 55 ft  Wheel 50 feet with 2 turns activity   Assist Level: Minimal Assistance - Patient > 75%  Wheel 150 feet activity   Assist Level: Total Assistance - Patient < 25%    Refer to Care Plan for Long Term Goals  SHORT TERM GOAL WEEK 1 PT Short Term Goal 1 (Week 1): STG=LTG due to ELOS.  Recommendations for other services: Therapeutic Recreation  Stress management  Skilled Therapeutic Intervention Evaluation completed (see details above and below) with education on PT POC and goals and individual treatment initiated with focus on functional mobility/transfers, LE strength, dynamic standing balance/coordination, ambulation,  stair navigation, simulated car transfers, and improved endurance with activity Patient provided with 16"x16" wheelchair. Patient also provided with RW for use in room and therapist adjusted to proper height for patient.  Patient sitting EOB set up for lunch upon PT arrival. Patient alert and agreeable to PT session. Patient denied pain during session.  Therapeutic Activity: Bed Mobility: Patient performed rolling R/L and supine to/from sit with supervision in a flat bed without use of bed rail.  Transfers: Patient performed sit to/from stand x4 with CGA without an AD. Provided verbal cues for forward weight shift and reaching back to sit for safety. Patient performed a simulated sedan height car transfer with min A using car frame and self-selected step-in technique. Educated on performing turn to sit technique versus step-in for increased safety and independence with transfer.  Gait Training:  6 Min Walk Test:  Instructed patient to ambulate as quickly and as safely as possible for 6 minutes using LRAD. Patient was allowed to take standing rest breaks without stopping the test, but if the patient required a sitting rest break the clock would be stopped and the test would be over.  Results: 388 feet (118.2 meters, Avg speed 0.32 m/s) without an with AD. Results indicate that the patient has reduced endurance with ambulation compared to age matched norms.  Age Matched Norms: 80-69 yo M: 77 F: 52, 24-79 yo M: 18 F: 471, 56-89 yo M: 417 F: 392 MDC: 58.21 meters (190.98 feet) or 50 meters (ANPTA Core Set of Outcome Measures for Adults with Neurologic Conditions, 2018)  Wheelchair Mobility:  Patient propelled wheelchair 55 feet with min A on L using B upper extremity with L veering and intermittent inattention to L hand with external distraction.   Neuromuscular Re-ed: Patient performed the [facility] Scale, see above: Patient demonstrated increased fall risk noted by score of 7/56 on the  Berg Balance Scale.  <45/56 = fall risk, <42/56 = predictive of recurrent falls, <40/56 = 100% fall risk  >41 = independent, 21-40 = assistive device, 0-20 = wheelchair level  MDC 6.9 (4 pts 45-56, 5 pts 35-44, 7 pts 25-34) (ANPTA Core Set of Outcome Measures for Adults with Neurologic Conditions, 2018)  Instructed pt in results of PT evaluation as detailed above, PT POC, rehab potential,  rehab goals, and discharge recommendations. Additionally discussed [facility]'s policies regarding fall safety and use of chair alarm and/or quick release belt. Pt verbalized understanding and in agreement. Will update pt's family members as they become available.   Patient in w/c in the room at end of session with breaks locked, seat belt alarm set, and all needs within reach.    Discharge Criteria: Patient will be discharged from PT if patient refuses treatment 3 consecutive times without medical reason, if treatment goals not met, if there is a change in medical status, if patient makes no progress towards goals or if patient is discharged from hospital.  The above assessment, treatment plan, treatment alternatives and goals were discussed and mutually agreed upon: by patient  Doreene Burke PT, DPT  06/07/2021, 4:00 PM

## 2021-06-07 NOTE — Evaluation (Signed)
Speech Language Pathology Assessment and Plan  Patient Details  Name: Allen Torres MRN: [id number] Date of Birth: [birth year]  SLP Diagnosis: Dysphagia;Dysarthria;Cognitive Impairments  Rehab Potential: Excellent ELOS: 7-10 days    Today's Date: 06/07/2021 SLP Individual Time: 1000-1117 SLP Individual Time Calculation (min): 68 min   Hospital Problem: Principal Problem:   Ischemic stroke [facility]) Active Problems:   Acute embolic stroke [facility])  Past Medical History:  Past Medical History:  Diagnosis Date   Hypertension    Renal disorder    Patient states, "chronic kidney disease"   Past Surgical History:  Past Surgical History:  Procedure Laterality Date   BIOPSY  03/16/2020   Procedure: BIOPSY;  Surgeon: Yetta Flock, MD;  Location: [facility];  Service: Gastroenterology;;   CHOLECYSTECTOMY N/A 03/18/2020   Procedure: LAPAROSCOPIC CHOLECYSTECTOMY;  Surgeon: Johnathan Hausen, MD;  Location: [facility];  Service: General;  Laterality: N/A;   ESOPHAGOGASTRODUODENOSCOPY N/A 03/16/2020   Procedure: ESOPHAGOGASTRODUODENOSCOPY (EGD);  Surgeon: Yetta Flock, MD;  Location: [facility];  Service: Gastroenterology;  Laterality: N/A;   facial cystectomy      Assessment / Plan / Recommendation Clinical Impression Patient is an 81 year old male with past medical history of hypertension, chronic kidney disease, hyperlipidemia who presented with left-sided weakness and dysarthria.  CT head demonstrated old small vessel infarctions of bilateral thalamus, basal ganglia/deep white matter.CT angio head demonstrated no intracranial lesions or high-grade stenosis. MRI showed small area of acute/subacute ischemia in the right centrum semiovale. No hemorrhage or mass effect. MRI also noted findings of chronic microvascular ischemia and generalized volume loss. Therapy evaluations completed with recommendations for CIR. Patient admitted 06/06/21.  SLP administered the [facility]  Mental Status Examination ([place]) . Patient scored  28/30 points with a score of 27 or above considered normal.  However, throughout informal evaluation, patient demonstrated impaired sustained attention to tasks with verbosity, impulsivity, poor emergent awareness and decreased attention to left field of environment/body. Patient also demonstrated moderate oral-motor left sided weakness resulting in imprecise consonants which impacted intelligibility. Intelligibility was also reduced due to increased speech rate. Patient with MBS on 06/06/21 which showed silent aspiration of thin liquids. No overt s/s of aspiration observed across any consistency with impaired mastication, oral residue and left anterior spillage noted with solid textures. Mod verbal cues for was needed for attention to bolus due to talking with a full oral cavity. Recommend patient continue current diet of Dys. 2 textures with nectar-thick liquids with full supervision to maximize safety with PO intake. Patient would benefit from skilled SLP intervention to maximize his cognitive and swallowing function as well was his speech intelligibility prior to discharge.     Skilled Therapeutic Interventions          Administered a cognitive-linguistic evaluation and BSE, please see above for details. At end of session, patient impulsive with one small posterior LOB requiring Min verbal cues. Min verbal cues were also required for attention to his left upper extremity while using the RW. Patient left upright in bed with alarm on and all needs within reach.     SLP Assessment  Patient will need skilled Speech Lanaguage Pathology Services during CIR admission    Recommendations  SLP Diet Recommendations: Dysphagia 2 (Fine chop);Nectar Liquid Administration via: Cup;Straw Medication Administration: Crushed with puree Supervision: Patient able to self feed;Full supervision/cueing for compensatory strategies Compensations: Slow rate;Small  sips/bites;Lingual sweep for clearance of pocketing;Minimize environmental distractions;Follow solids with liquid;Monitor for anterior loss Postural Changes and/or Swallow Maneuvers: Seated  upright 90 degrees Oral Care Recommendations: Oral care BID Recommendations for Other Services: Neuropsych consult Patient destination: Home Follow up Recommendations: 24 hour supervision/assistance;Outpatient SLP;Home Health SLP Equipment Recommended: To be determined    SLP Frequency 3 to 5 out of 7 days   SLP Duration  SLP Intensity  SLP Treatment/Interventions 7-10 days  Minumum of 1-2 x/day, 30 to 90 minutes  Cognitive remediation/compensation;Internal/external aids;Cueing hierarchy;Environmental controls;Therapeutic Activities;Functional tasks;Patient/family education    Pain No/Denies Pain   Prior Functioning Type of Home: House  Lives With: Daughter Available Help at Discharge: Family Vocation: Retired  SLP Evaluation Cognition Overall Cognitive Status: Impaired/Different from baseline Arousal/Alertness: Awake/alert Orientation Level: Oriented X4 Year: 2023 Month: February Attention: Sustained Sustained Attention: Impaired Sustained Attention Impairment: Verbal complex;Functional complex Memory: Appears intact Awareness: Impaired Awareness Impairment: Emergent impairment Problem Solving: Appears intact Behaviors: Impulsive Safety/Judgment: Impaired  Comprehension Auditory Comprehension Overall Auditory Comprehension: Appears within functional limits for tasks assessed Expression Expression Primary Mode of Expression: Verbal Verbal Expression Overall Verbal Expression: Appears within functional limits for tasks assessed Oral Motor Oral Motor/Sensory Function Overall Oral Motor/Sensory Function: Moderate impairment Facial ROM: Reduced left;Suspected CN VII (facial) dysfunction Facial Symmetry: Abnormal symmetry left;Suspected CN VII (facial) dysfunction Facial  Strength: Reduced left;Suspected CN VII (facial) dysfunction Facial Sensation: Within Functional Limits Lingual ROM: Within Functional Limits Lingual Symmetry: Within Functional Limits Lingual Strength: Reduced;Suspected CN XII (hypoglossal) dysfunction Velum: Within Functional Limits Mandible: Within Functional Limits Motor Speech Overall Motor Speech: Impaired Respiration: Within functional limits Phonation: Normal Resonance: Within functional limits Articulation: Impaired Level of Impairment: Phrase Intelligibility: Intelligibility reduced Word: 75-100% accurate Phrase: 75-100% accurate Sentence: 75-100% accurate Conversation: 75-100% accurate Motor Planning: Witnin functional limits Effective Techniques: Slow rate;Over-articulate  Care Tool Care Tool Cognition Ability to hear (with hearing aid or hearing appliances if normally used Ability to hear (with hearing aid or hearing appliances if normally used): 2. Moderate difficulty - speaker has to increase volume and speak distinctly   Expression of Ideas and Wants Expression of Ideas and Wants: 3. Some difficulty - exhibits some difficulty with expressing needs and ideas (e.g, some words or finishing thoughts) or speech is not clear   Understanding Verbal and Non-Verbal Content Understanding Verbal and Non-Verbal Content: 3. Usually understands - understands most conversations, but misses some part/intent of message. Requires cues at times to understand  Memory/Recall Ability Memory/Recall Ability : That he or she is in a hospital/hospital unit   Bedside Swallowing Assessment General Date of Onset: 06/01/21 Previous Swallow Assessment: MBS 06/06/21: Recommended Dys. 2 textures with nectar-thick liquids due to silent aspiration of thin liquids. Diet Prior to this Study: Nectar-thick liquids;Dysphagia 2 (chopped) Temperature Spikes Noted: No Respiratory Status: Room air History of Recent Intubation: No Behavior/Cognition:  Alert;Cooperative;Distractible Oral Cavity - Dentition: Edentulous Self-Feeding Abilities: Able to feed self Vision: Functional for self-feeding Patient Positioning: Upright in chair/Tumbleform Baseline Vocal Quality: Normal Volitional Cough: Strong Volitional Swallow: Able to elicit  Ice Chips Ice chips: Within functional limits Presentation: Spoon Thin Liquid Thin Liquid: Not tested Nectar Thick Nectar Thick Liquid: Impaired Presentation: Cup Oral phase functional implications: Left anterior spillage Honey Thick Honey Thick Liquid: Not tested Puree Puree: Within functional limits Presentation: Self Fed;Spoon Solid Solid: Impaired Presentation: Self Fed;Spoon Oral Phase Impairments: Impaired mastication Oral Phase Functional Implications: Left anterior spillage BSE Assessment Risk for Aspiration Impact on safety and function: Mild aspiration risk;Moderate aspiration risk  Short Term Goals: Week 1: SLP Short Term Goal 1 (Week 1): STGs=LTGs due to ELOS  Refer  to Care Plan for Long Term Goals  Recommendations for other services: Neuropsych  Discharge Criteria: Patient will be discharged from SLP if patient refuses treatment 3 consecutive times without medical reason, if treatment goals not met, if there is a change in medical status, if patient makes no progress towards goals or if patient is discharged from hospital.  The above assessment, treatment plan, treatment alternatives and goals were discussed and mutually agreed upon: by patient  Laynee Lockamy 06/07/2021, 3:57 PM

## 2021-06-08 DIAGNOSIS — I639 Cerebral infarction, unspecified: Secondary | ICD-10-CM | POA: Diagnosis not present

## 2021-06-08 LAB — VITAMIN B12: Vitamin B-12: 3187 pg/mL — ABNORMAL HIGH (ref 180–914)

## 2021-06-08 LAB — MAGNESIUM: Magnesium: 1.8 mg/dL (ref 1.7–2.4)

## 2021-06-08 LAB — VITAMIN D 25 HYDROXY (VIT D DEFICIENCY, FRACTURES): Vit D, 25-Hydroxy: 77.61 ng/mL (ref 30–100)

## 2021-06-08 MED ORDER — MAGNESIUM HYDROXIDE 400 MG/5ML PO SUSP
5.0000 mL | Freq: Every day | ORAL | Status: DC
  Administered 2021-06-08 – 2021-06-12 (×5): 5 mL via ORAL
  Filled 2021-06-08 (×5): qty 30

## 2021-06-08 MED ORDER — TAMSULOSIN HCL 0.4 MG PO CAPS
0.4000 mg | ORAL_CAPSULE | Freq: Every day | ORAL | Status: DC
  Administered 2021-06-08: 0.4 mg via ORAL
  Filled 2021-06-08: qty 1

## 2021-06-08 MED ORDER — MAGNESIUM GLUCONATE 500 MG PO TABS
250.0000 mg | ORAL_TABLET | Freq: Every day | ORAL | Status: DC
  Administered 2021-06-08 – 2021-06-11 (×4): 250 mg via ORAL
  Filled 2021-06-08 (×4): qty 1

## 2021-06-08 NOTE — Progress Notes (Signed)
Speech Language Pathology Daily Session Note  Patient Details  Name: Allen Torres MRN: [id number] Date of Birth: 04-17-1940  Today's Date: 06/08/2021 SLP Individual Time: 1000-1045 SLP Individual Time Calculation (min): 45 min  Short Term Goals: Week 1: SLP Short Term Goal 1 (Week 1): STGs=LTGs due to ELOS  Skilled Therapeutic Interventions: Skilled treatment session focused on dysphagia and communication goals. SLP facilitated session by providing oral care via the suction toothbrush. Patient consumed trials of ice chips and thin liquids via tsp and cup. Patient's swallow initiation appeared timely and swift without overt s/s of aspiration. Recommend patient initiate the water protocol. SLP also provided education regarding speech intelligibility strategies. Patient required Mod-Max verbal cues to implement the strategies at the phrase level. Patient left upright in wheelchair with alarm on and all needs within reach. Continue with current plan of care.      Pain No/Denies Pain   Therapy/Group: Individual Therapy  [name] 06/08/2021, 3:59 PM

## 2021-06-08 NOTE — Progress Notes (Signed)
Patient ID: Allen Torres, male   DOB: 1940-11-04, 81 y.o.   MRN: [id number]  Met with pt to discuss his concerns and yesterday's occurrence. He reports he does not want to hurt himself he ws frustrated with the nursing staff here and expressed this to karen-RN yesterday. Pt is not one to ask for assistance and tends to do what he wants to do. He does not like being spoken to like a child. He is having a good day today and can see his progress which is encouraging to him. MD is addressing his voiding issue and reports he had this before coming into the hospital. So he hopes it can be addressed and resolved before leaves here. Have also placed on neuro-psych list to be seen while here. Will continue to provide support and work on discharge needs.

## 2021-06-08 NOTE — Progress Notes (Signed)
PROGRESS NOTE   Subjective/Complaints: C/o urinary retention and constipation. Flomax and magnesium ordered. Appreciate nursing education regarding cathing He discusses his myalgias with statin use  ROS: +urinary retention   Objective:   DG Swallowing Func-Speech Pathology  Result Date: 06/06/2021 Table formatting from the original result was not included. Objective Swallowing Evaluation: Type of Study: MBS-Modified Barium Swallow Study  Patient Details Name: Allen Torres MRN: [id number] Date of Birth: 1940/04/19 Today's Date: 06/06/2021 Time: SLP Start Time (ACUTE ONLY): 15 -SLP Stop Time (ACUTE ONLY): 1253 SLP Time Calculation (min) (ACUTE ONLY): 13 min Past Medical History: Past Medical History: Diagnosis Date  Hypertension   Renal disorder   Patient states, "chronic kidney disease" Past Surgical History: Past Surgical History: Procedure Laterality Date  BIOPSY  03/16/2020  Procedure: BIOPSY;  Surgeon: Yetta Flock, MD;  Location: [facility];  Service: Gastroenterology;;  CHOLECYSTECTOMY N/A 03/18/2020  Procedure: LAPAROSCOPIC CHOLECYSTECTOMY;  Surgeon: Johnathan Hausen, MD;  Location: [facility];  Service: General;  Laterality: N/A;  ESOPHAGOGASTRODUODENOSCOPY N/A 03/16/2020  Procedure: ESOPHAGOGASTRODUODENOSCOPY (EGD);  Surgeon: Yetta Flock, MD;  Location: [facility];  Service: Gastroenterology;  Laterality: N/A;  facial cystectomy   HPI: Pt is an 81 yo male presenting wtih L facial droop and slurred speech. MRI showed small area of acute/subacute ischemia in the R centrum semiovale. PMH includes: HTN, renal disorder. Increased coughing at bedside therefore MBS recommended.  Subjective: emotional  Recommendations for follow up therapy are one component of a multi-disciplinary discharge planning process, led by the attending physician.  Recommendations may be updated based on patient status, additional functional criteria and  insurance authorization. Assessment / Plan / Recommendation Clinical Impressions 06/06/2021 Clinical Impression Pt exhibits mild oropharyngeal dysphagia with trace aspiration. Decreased containment resulted in sublingual loss with  re establish lingual position for bolus transit with liquid consistencies. Piecemeal transit pattern demonstrated with nectar. Solid bolus mastication was prolonged but complete without significant residue. Laryngeal closure was full however late with thin penetrated to cords and aspirate after without sensation to cough but occasional weak and non productive throat clear. Aspiration present during chin tuck trial. Coordinated and cohesive pharyngeal transit observed with nectar thick barium consistently. Therapist recommends nectar thick liquids, continue Dys 2 texture, pills crushed, and full fading to intermittent assist with meals. Trial of RMT may be attempted. SLP Visit Diagnosis Dysphagia, oropharyngeal phase (R13.12) Attention and concentration deficit following -- Frontal lobe and executive function deficit following -- Impact on safety and function Mild aspiration risk;Moderate aspiration risk   Treatment Recommendations 06/06/2021 Treatment Recommendations Therapy as outlined in treatment plan below   Prognosis 06/06/2021 Prognosis for Safe Diet Advancement Good Barriers to Reach Goals -- Barriers/Prognosis Comment -- Diet Recommendations 06/06/2021 SLP Diet Recommendations Dysphagia 2 (Fine chop) solids;Nectar thick liquid Liquid Administration via Cup;Straw Medication Administration Crushed with puree Compensations Slow rate;Small sips/bites;Lingual sweep for clearance of pocketing Postural Changes Seated upright at 90 degrees   Other Recommendations 06/06/2021 Recommended Consults -- Oral Care Recommendations Oral care BID Other Recommendations -- Follow Up Recommendations Acute inpatient rehab (3hours/day) Assistance recommended at discharge Intermittent Supervision/Assistance  Functional Status Assessment Patient has had a recent decline in their functional status and  demonstrates the ability to make significant improvements in function in a reasonable and predictable amount of time. Frequency and Duration  06/06/2021 Speech Therapy Frequency (ACUTE ONLY) min 2x/week Treatment Duration 2 weeks   Oral Phase 06/06/2021 Oral Phase Impaired Oral - Pudding Teaspoon -- Oral - Pudding Cup -- Oral - Honey Teaspoon -- Oral - Honey Cup -- Oral - Nectar Teaspoon -- Oral - Nectar Cup Piecemeal swallowing;Decreased bolus cohesion;Other (Comment) Oral - Nectar Straw -- Oral - Thin Teaspoon -- Oral - Thin Cup Decreased bolus cohesion Oral - Thin Straw -- Oral - Puree -- Oral - Mech Soft -- Oral - Regular Delayed oral transit Oral - Multi-Consistency -- Oral - Pill -- Oral Phase - Comment --  Pharyngeal Phase 06/06/2021 Pharyngeal Phase Impaired Pharyngeal- Pudding Teaspoon -- Pharyngeal -- Pharyngeal- Pudding Cup -- Pharyngeal -- Pharyngeal- Honey Teaspoon -- Pharyngeal -- Pharyngeal- Honey Cup -- Pharyngeal -- Pharyngeal- Nectar Teaspoon -- Pharyngeal -- Pharyngeal- Nectar Cup WFL Pharyngeal -- Pharyngeal- Nectar Straw WFL Pharyngeal -- Pharyngeal- Thin Teaspoon -- Pharyngeal -- Pharyngeal- Thin Cup Penetration/Aspiration during swallow;Compensatory strategies attempted (with notebox) Pharyngeal Material enters airway, CONTACTS cords and not ejected out;Material enters airway, passes BELOW cords without attempt by patient to eject out (silent aspiration) Pharyngeal- Thin Straw -- Pharyngeal -- Pharyngeal- Puree -- Pharyngeal -- Pharyngeal- Mechanical Soft -- Pharyngeal -- Pharyngeal- Regular WFL Pharyngeal -- Pharyngeal- Multi-consistency -- Pharyngeal -- Pharyngeal- Pill -- Pharyngeal -- Pharyngeal Comment --  Cervical Esophageal Phase  06/06/2021 Cervical Esophageal Phase WFL Pudding Teaspoon -- Pudding Cup -- Honey Teaspoon -- Honey Cup -- Nectar Teaspoon -- Nectar Cup -- Nectar Straw -- Thin Teaspoon  -- Thin Cup -- Thin Straw -- Puree -- Mechanical Soft -- Regular -- Multi-consistency -- Pill -- Cervical Esophageal Comment -- [name] 06/06/2021, 1:53 PM                     Recent Labs    06/07/21 1039  WBC 6.4  HGB 14.5  HCT 40.2  PLT 232   Recent Labs    06/07/21 1039  NA 133*  K 4.4  CL 100  CO2 21*  GLUCOSE 144*  BUN 30*  CREATININE 1.62*  CALCIUM 10.1    Intake/Output Summary (Last 24 hours) at 06/08/2021 1214 Last data filed at 06/08/2021 0900 Gross per 24 hour  Intake 840 ml  Output --  Net 840 ml        Physical Exam: Vital Signs Blood pressure (!) 142/78, pulse 82, temperature 98.4 F (36.9 C), temperature source Oral, resp. rate 18, height 5\' 9"  (1.753 m), weight 91.5 kg, SpO2 96 %. Gen: no distress, normal appearing HEENT: oral mucosa pink and moist, NCAT Cardio: Reg rate Chest: normal effort, normal rate of breathing Abd: soft, non-distended Ext: no edema Psych: pleasant, normal affect Skin: intact Neurological:     Mental Status: He is alert.     Coordination: Coordination abnormal.     Comments: Left facial droop, slurred speech. LUE clumsiness. Right side strength 5/5, left side 4/5. Sensation intact bilaterally.   Assessment/Plan: 1. Functional deficits which require 3+ hours per day of interdisciplinary therapy in a comprehensive inpatient rehab setting. Physiatrist is providing close team supervision and 24 hour management of active medical problems listed below. Physiatrist and rehab team continue to assess barriers to discharge/monitor patient progress toward functional and medical goals  Care Tool:  Bathing    Body parts bathed by patient: Left arm, Chest, Abdomen, Front perineal  area, Buttocks, Left upper leg, Right upper leg, Left lower leg, Right lower leg, Face   Body parts bathed by helper: Right arm     Bathing assist Assist Level: Minimal Assistance - Patient > 75%     Upper Body Dressing/Undressing Upper body  dressing   What is the patient wearing?: Pull over shirt    Upper body assist Assist Level: Minimal Assistance - Patient > 75%    Lower Body Dressing/Undressing Lower body dressing      What is the patient wearing?: Pants, Incontinence brief     Lower body assist Assist for lower body dressing: Minimal Assistance - Patient > 75%     Toileting Toileting    Toileting assist Assist for toileting: Minimal Assistance - Patient > 75%     Transfers Chair/bed transfer  Transfers assist     Chair/bed transfer assist level: Minimal Assistance - Patient > 75%     Locomotion Ambulation   Ambulation assist      Assist level: Minimal Assistance - Patient > 75% Assistive device: No Device Max distance: 400 ft   Walk 10 feet activity   Assist     Assist level: Minimal Assistance - Patient > 75% Assistive device: No Device   Walk 50 feet activity   Assist    Assist level: Minimal Assistance - Patient > 75% Assistive device: No Device    Walk 150 feet activity   Assist    Assist level: Minimal Assistance - Patient > 75% Assistive device: No Device    Walk 10 feet on uneven surface  activity   Assist     Assist level: Minimal Assistance - Patient > 75%     Wheelchair     Assist Is the patient using a wheelchair?: Yes Type of Wheelchair: Manual    Wheelchair assist level: Minimal Assistance - Patient > 75% Max wheelchair distance: 55 ft    Wheelchair 50 feet with 2 turns activity    Assist        Assist Level: Minimal Assistance - Patient > 75%   Wheelchair 150 feet activity     Assist      Assist Level: Total Assistance - Patient < 25%   Blood pressure (!) 142/78, pulse 82, temperature 98.4 F (36.9 C), temperature source Oral, resp. rate 18, height 5\' 9"  (1.753 m), weight 91.5 kg, SpO2 96 %.    Medical Problem List and Plan: 1. Functional deficits secondary to right CR infarct likely due to small vessel disease              -patient may shower             -ELOS/Goals: S 5-7 days             Initial CIR evaluations today 2.  Antithrombotics: -DVT/anticoagulation:  Pharmaceutical: Heparin             -antiplatelet therapy: Plavix and aspirin 81 mg for 3 weeks then aspirin alone 3. Pain Management: Tylenol 4. Mood: LCSW to evaluate and provide emotional support             -antipsychotic agents: n/a 5. Neuropsych: This patient is capable of making decisions on his own behalf. 6. Skin/Wound Care: Routine skin care checks 7. Fluids/Electrolytes/Nutrition: routine Is and Os and follow-up chemistries 8. 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 Follow-up BMP in AM. 11: Obstructive type urinary symptoms: likely BPH             --Check PVRs 12: Constipation: scheduled senna and PRNs. Add daily milk of mag 58mL 13: Gout: No current flare.  Allopurinol held, discussed avoiding soda products/added sugar.  14. BPH: Start flomax 0.4mg  HS. Monitor for loose stools as he had this reaction to HCTZ.  15. Hypomagnesemia: discussed with him that his magnesium levels are low. Start magnesium gluconate 250mg  HS     LOS: 2 days A FACE TO FACE EVALUATION WAS PERFORMED  Clide Deutscher Tawnya Pujol 06/08/2021, 12:14 PM

## 2021-06-08 NOTE — Progress Notes (Signed)
Physical Therapy Session Note  Patient Details  Name: Allen Torres MRN: [id number] Date of Birth: May 28, 1940  Today's Date: 06/08/2021 PT Individual Time: 0915-1000 PT Individual Time Calculation (min): 45 min   Short Term Goals: Week 1:  PT Short Term Goal 1 (Week 1): STG=LTG due to ELOS.  Skilled Therapeutic Interventions/Progress Updates:     Patient in bed upon PT arrival. Patient alert and agreeable to PT session. Patient denied pain during session.  Therapeutic Activity: Bed Mobility: Patient performed supine to sit with supervision. Provided verbal cues for pushing through his elbows to sit up from a flat bed. Patient doffed non-skid socks and donned regular socks with min A for placement over jagged toe nails. He then donned new tennis shoes with mod A due to tight opening. Sitting balance with leaning forward and attaining figure four sitting with performed with supervision during task. Transfers: Patient performed sit to/from stand x5 with CGA progressing to close supervision. Provided verbal cues for forward weight shift and reaching back to sit x1.  Gait Training:  Patient ambulated >100 feet with CGA without an AD with reduced L step length and height, decreased arm swing L>R and trunk rotation, and reduced gait speed. He then ambulated forward and backwards 20 feet x3 with CGA in front of a mirror for improved gait symmetry and cues for increased arm swing with reciprocal gait pattern. He then ambulated 360 feet with CGA progressing to close supervision focused on carry-over of gait mechanics from previous activity. Noted improved gait speed and balance. Ambulated >100 feet back to his room with close supervision with min cues as above.  Patient in w/c in the room at end of session with breaks locked, seat belt alarm set, and all needs within reach.   Therapy Documentation Precautions:  Precautions Precautions: Fall Precaution Comments: left side weakness,  dysarthric Restrictions Weight Bearing Restrictions: No    Therapy/Group: Individual Therapy  Menashe Kafer L Quinley Nesler PT, DPT  06/08/2021, 5:10 PM

## 2021-06-08 NOTE — Progress Notes (Signed)
Physical Therapy Session Note  Patient Details  Name: Allen Torres MRN: [id number] Date of Birth: 13-Jun-1940  Today's Date: 06/08/2021 PT Individual Time: 1400-1455 PT Individual Time Calculation (min): 55 min   Short Term Goals: Week 1:  PT Short Term Goal 1 (Week 1): STG=LTG due to ELOS.  Skilled Therapeutic Interventions/Progress Updates:      Pt supine in bed to start - required some gentle encouragement for participation. Denies pain but reports fatigue from busy day of therapies. Supine<>sit with supervision with HOB slightly elevated. Sitting EOB without LOB. Donned socks/shoes with totalA for time. Sit<>stand with no AD and minA due to difficulty powering to rise - requiring several attempts to complete. Ambulated with minA and no AD ~264f to ortho rehab gym - some L inattention with difficulty avoiding environmental obstacles (hospital beds, chairs, vital machines, etc). Used BITS system to assist with assessing L visual scanning - pt with ~97% accuracy using visual pursuit - completed in feet shoulder width apart and tandem stance to challenge balance - CGA/minA for balance. Pt c/o LUE weakness from his CVA. Grip strength assessed LUE: 32lb RUE: 75lb  Continued to challenge balance and gait training - ball toss using +2 assist while ambulating with forced use of LUE and challenging reaching outside BOS, balance, and righting reactions. Completed alternating toe taps to soccer ball with minA for balance, increased difficulty with single leg stance on R and achieving hip flexion on L.   Ambulated up/down ramp with minA and no AD, mulch pit, and curb transfers with minA with some poor safety awareness while stepping down due to poor L foot clearance.   Ambulated back to room with no AD and CGA - VC for increasing arm swing, attending to L, and increasing gait speed. Concluded session seated EOB, bed alarm on, all needs met. Pt requesting water at end of session - educating on nectar  thick consistencies and NT notified at end of session.  Therapy Documentation Precautions:  Precautions Precautions: Fall Precaution Comments: left side weakness, dysarthric Restrictions Weight Bearing Restrictions: No General:    Therapy/Group: Individual Therapy  CAlger Simons2/23/2023, 4:11 PM

## 2021-06-08 NOTE — Progress Notes (Signed)
Occupational Therapy Session Note  Patient Details  Name: Allen Torres MRN: [id number] Date of Birth: [birth year]  Today's Date: 06/08/2021 OT Individual Time: 1110-1210 OT Individual Time Calculation (min): 60 min    Short Term Goals: Week 1:  OT Short Term Goal 1 (Week 1): Pt will don shirt with (S) OT Short Term Goal 2 (Week 1): Pt will complete a toilet transfer with CGA OT Short Term Goal 3 (Week 1): Pt will complete toileting tasks with CGA OT Short Term Goal 4 (Week 1): Pt will complete LB dressing with (S)  Skilled Therapeutic Interventions/Progress Updates:    Pt sitting up in w/c, no c/o pain, just "feeling tired" but agreeable to taking a shower and changing clothes during this OT session.  Pt ambulated to bathroom using RW with CGA.  Pt completed toilet transfer and toileting with close supervision.  Ambulated a few steps to shower bench with use of grab bar with CGA and one LOB needing min assist to recover during tight space negotiation.  Stand to sit on shower bench with close supervision.  Pt doffed shirt with supervision.  Applied water proof barrier to IV site.  Pt bathed UB with close supervision and intermittent assist with retrieving soap and applying to washcloth due to impaired coordination and proprioception of LUE.  Required CGA at sit<>stand level to bathe LB.  After drying off pt donned clean shirt with min assist.  He then ambulated to commode placed in bathroom using RW with CGA.  Donned brief and pants with overall mod assist.  Pt ambulated back to bedroom and requested back to bed due to feeling fatigued from shower and completed all mobility with CGA.  Pts son arrived and at bedside upon OT departure.  Call bell in reach, bed alarm on.    Therapy Documentation Precautions:  Precautions Precautions: Fall Precaution Comments: left side weakness, dysarthric Restrictions Weight Bearing Restrictions: No   Therapy/Group: Individual Therapy  Amie Critchley 06/08/2021, 4:27 PM

## 2021-06-09 DIAGNOSIS — I639 Cerebral infarction, unspecified: Secondary | ICD-10-CM | POA: Diagnosis not present

## 2021-06-09 MED ORDER — TAMSULOSIN HCL 0.4 MG PO CAPS
0.8000 mg | ORAL_CAPSULE | Freq: Every day | ORAL | Status: DC
Start: 2021-06-09 — End: 2021-06-14
  Administered 2021-06-09 – 2021-06-13 (×5): 0.8 mg via ORAL
  Filled 2021-06-09 (×5): qty 2

## 2021-06-09 NOTE — IPOC Note (Signed)
Overall Plan of Care [facility]) Patient Details Name: Allen Torres MRN: [id number] DOB: 04-25-1940  Admitting Diagnosis: Ischemic stroke [facility])  Hospital Problems: Principal Problem:   Ischemic stroke [facility]) Active Problems:   Acute embolic stroke [facility])     Functional Problem List: Nursing Pain, Medication Management, Bowel, Endurance  PT Balance, Perception, Behavior, Safety, Sensory, Edema, Endurance, Skin Integrity, Motor, Nutrition, Pain  OT Balance, Cognition, Vision, Endurance, Motor, Pain, Safety, Perception  SLP Cognition, Linguistic, Nutrition  TR         Basic ADLs: OT Grooming, Dressing, Bathing, Toileting     Advanced  ADLs: OT       Transfers: PT Bed Mobility, Bed to Chair, Car, Occupational psychologist, Research scientist (life sciences): PT Ambulation, Psychologist, prison and probation services, Stairs     Additional Impairments: OT Fuctional Use of Upper Extremity  SLP Swallowing, Communication, Social Cognition expression Attention, Awareness  TR      Anticipated Outcomes Item Anticipated Outcome  Self Feeding (S)  Swallowing      Basic self-care  (S)  Toileting  (S)   Bathroom Transfers (S)  Bowel/Bladder  Manage bowel w mod I assist  Transfers  mod I using LRAD  Locomotion  supervision using LRAD  Communication     Cognition     Pain  pain at or below level 4 with prn  Safety/Judgment  maintain safey w cues   Therapy Plan: PT Intensity: Minimum of 1-2 x/day ,45 to 90 minutes PT Frequency: 5 out of 7 days PT Duration Estimated Length of Stay: 7-10 days OT Intensity: Minimum of 1-2 x/day, 45 to 90 minutes OT Frequency: 5 out of 7 days OT Duration/Estimated Length of Stay: 7-10 days SLP Intensity: Minumum of 1-2 x/day, 30 to 90 minutes SLP Frequency: 3 to 5 out of 7 days SLP Duration/Estimated Length of Stay: 7-10 days   Due to the current state of emergency, patients may not be receiving their 3-hours of Medicare-mandated therapy.   Team  Interventions: Nursing Interventions Patient/Family Education, Bowel Management, Disease Management/Prevention, Pain Management, Medication Management, Dysphagia/Aspiration Precaution Training  PT interventions Ambulation/gait training, Cognitive remediation/compensation, Discharge planning, DME/adaptive equipment instruction, Functional mobility training, Pain management, Psychosocial support, Splinting/orthotics, Therapeutic Activities, UE/LE Strength taining/ROM, Visual/perceptual remediation/compensation, Wheelchair propulsion/positioning, UE/LE Coordination activities, Therapeutic Exercise, Stair training, Skin care/wound management, Patient/family education, Neuromuscular re-education, Functional electrical stimulation, Disease management/prevention, Firefighter, Warden/ranger  OT Interventions Warden/ranger, Neuromuscular re-education, Cognitive remediation/compensation, DME/adaptive equipment instruction, Pain management, Skin care/wound managment, Self Care/advanced ADL retraining, Therapeutic Exercise, UE/LE Strength taining/ROM, UE/LE Coordination activities, Patient/family education, Discharge planning, Functional mobility training, Psychosocial support, Therapeutic Activities, Visual/perceptual remediation/compensation, Community reintegration, Disease mangement/prevention, Wheelchair propulsion/positioning  SLP Interventions Cognitive remediation/compensation, Internal/external aids, Financial trader, Environmental controls, Therapeutic Activities, Functional tasks, Patient/family education  TR Interventions    SW/CM Interventions Discharge Planning, Psychosocial Support, Patient/Family Education   Barriers to Discharge MD  Medical stability  Nursing Decreased caregiver support 1 level ramped entry w children  PT Decreased caregiver support, Nutrition means, Behavior    OT Home environment access/layout Stairs to enter  SLP      SW Insurance for  SNF coverage     Team Discharge Planning: Destination: PT-Home ,OT- Home , SLP-Home Projected Follow-up: PT-Outpatient PT, OT-  Home health OT, SLP-24 hour supervision/assistance, Outpatient SLP, Home Health SLP Projected Equipment Needs: PT-Rolling walker with 5" wheels, OT- Tub/shower bench, SLP-To be determined Equipment Details: PT- , OT-  Patient/family involved in discharge planning: PT- Patient,  OT-Patient, SLP-Patient  MD ELOS: 5-7 days Medical Rehab Prognosis:  Excellent Assessment: Allen Torres is an 81 year old man admitted to CIR with functional deficits secondary to 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 Medications are being managed, and labs and vitals are being monitored regularly.     See Team Conference Notes for weekly updates to the plan of care

## 2021-06-09 NOTE — Progress Notes (Addendum)
PROGRESS NOTE   Subjective/Complaints: Still with urinary retention despite flomax. BP stable- will increase flomax dose.  Provided list of foods/supplements to help with HLD He is bothered by his dysarthria  ROS: +urinary retention, +dysarthria   Objective:   No results found. Recent Labs    06/07/21 1039  WBC 6.4  HGB 14.5  HCT 40.2  PLT 232   Recent Labs    06/07/21 1039  NA 133*  K 4.4  CL 100  CO2 21*  GLUCOSE 144*  BUN 30*  CREATININE 1.62*  CALCIUM 10.1    Intake/Output Summary (Last 24 hours) at 06/09/2021 0840 Last data filed at 06/08/2021 1759 Gross per 24 hour  Intake 470 ml  Output --  Net 470 ml        Physical Exam: Vital Signs Blood pressure 128/78, pulse 84, temperature 97.8 F (36.6 C), temperature source Oral, resp. rate 20, height 5\' 9"  (1.753 m), weight 91.5 kg, SpO2 93 %. Gen: no distress, normal appearing, BMI 29.79 HEENT: oral mucosa pink and moist, NCAT Cardio: Reg rate Chest: normal effort, normal rate of breathing Abd: soft, non-distended Ext: no edema Psych: pleasant, normal affect Skin: intact Neurological:     Mental Status: He is alert.     Coordination: Coordination abnormal.     Comments: Left facial droop, slurred speech. LUE clumsiness. Right side strength 5/5, left side 4/5. Sensation intact bilaterally.   Assessment/Plan: 1. Functional deficits which require 3+ hours per day of interdisciplinary therapy in a comprehensive inpatient rehab setting. Physiatrist is providing close team supervision and 24 hour management of active medical problems listed below. Physiatrist and rehab team continue to assess barriers to discharge/monitor patient progress toward functional and medical goals  Care Tool:  Bathing    Body parts bathed by patient: Left arm, Chest, Abdomen, Front perineal area, Buttocks, Left upper leg, Right upper leg, Left lower leg, Right lower leg,  Face   Body parts bathed by helper: Right arm     Bathing assist Assist Level: Minimal Assistance - Patient > 75%     Upper Body Dressing/Undressing Upper body dressing   What is the patient wearing?: Pull over shirt    Upper body assist Assist Level: Minimal Assistance - Patient > 75%    Lower Body Dressing/Undressing Lower body dressing      What is the patient wearing?: Pants, Incontinence brief     Lower body assist Assist for lower body dressing: Minimal Assistance - Patient > 75%     Toileting Toileting    Toileting assist Assist for toileting: Minimal Assistance - Patient > 75%     Transfers Chair/bed transfer  Transfers assist     Chair/bed transfer assist level: Minimal Assistance - Patient > 75%     Locomotion Ambulation   Ambulation assist      Assist level: Minimal Assistance - Patient > 75% Assistive device: No Device Max distance: 400 ft   Walk 10 feet activity   Assist     Assist level: Minimal Assistance - Patient > 75% Assistive device: No Device   Walk 50 feet activity   Assist    Assist level: Minimal Assistance -  Patient > 75% Assistive device: No Device    Walk 150 feet activity   Assist    Assist level: Minimal Assistance - Patient > 75% Assistive device: No Device    Walk 10 feet on uneven surface  activity   Assist     Assist level: Minimal Assistance - Patient > 75%     Wheelchair     Assist Is the patient using a wheelchair?: Yes Type of Wheelchair: Manual    Wheelchair assist level: Minimal Assistance - Patient > 75% Max wheelchair distance: 55 ft    Wheelchair 50 feet with 2 turns activity    Assist        Assist Level: Minimal Assistance - Patient > 75%   Wheelchair 150 feet activity     Assist      Assist Level: Total Assistance - Patient < 25%   Blood pressure 128/78, pulse 84, temperature 97.8 F (36.6 C), temperature source Oral, resp. rate 20, height 5\' 9"   (1.753 m), weight 91.5 kg, SpO2 93 %.    Medical Problem List and Plan: 1. 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  Antithrombotics: -DVT/anticoagulation:  Pharmaceutical: Heparin             -antiplatelet therapy: Plavix and aspirin 81 mg for 3 weeks then aspirin alone 3. Pain Management: Tylenol 4. Mood: LCSW to evaluate and provide emotional support             -antipsychotic agents: n/a 5. Neuropsych: This patient is capable of making decisions on his own behalf. 6. Skin/Wound Care: Routine skin care checks 7. Fluids/Electrolytes/Nutrition: routine Is and Os and follow-up chemistries 8. Hypertension: continue atenolol,doxazosin             --recommended avoiding chlorthalidone secondary to hyponatremia             -- Lisinopril held 9: Hyperlipidemia: refusing meds due to side effects. Provided list of foods and supplements that can help lower LDL.  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 Monitor as needed.  11. Overweight: provide dietary education.  12: Constipation: scheduled senna and PRNs. Add daily milk of mag 62mL. Mushy BM on 2/23. 13: Gout: No current flare.  Allopurinol held, discussed avoiding soda products/added sugar.  14. BPH: Increase flomax to 0.8mg  HS. Monitor for loose stools as he had this reaction to HCTZ.  15. Hypomagnesemia: discussed with him that his magnesium levels are low. Start magnesium gluconate 250mg  HS    LOS: 3 days A FACE TO FACE EVALUATION WAS PERFORMED  Yezenia Fredrick P Hoang Pettingill 06/09/2021, 8:40 AM

## 2021-06-09 NOTE — Progress Notes (Signed)
Occupational Therapy Session Note  Patient Details  Name: Allen Torres MRN: [id number] Date of Birth: [birth year]  Today's Date: 06/09/2021 OT Individual Time: 1000-1100 OT Individual Time Calculation (min): 60 min    Short Term Goals: Week 1:  OT Short Term Goal 1 (Week 1): Pt will don shirt with (S) OT Short Term Goal 2 (Week 1): Pt will complete a toilet transfer with CGA OT Short Term Goal 3 (Week 1): Pt will complete toileting tasks with CGA OT Short Term Goal 4 (Week 1): Pt will complete LB dressing with (S)  Skilled Therapeutic Interventions/Progress Updates:  Pt greeted supine in bed with MD present. Pt agreeable to OT intervention. Session focus on BADL reeducation, functional mobility, dynamic standing balance, LUE coordination in prep for ADL participation and decreasing overall caregiver burden.  Pt completed supine>sit with supervision and ambulatory shower transfer with no AD and CGA. Pt needed MIN A to step into shower with one LOB as pt did not notice left leg of shower seat. Pt completed bathnig with overall MIN A for standing balance. Pt exited shower with MINA for increased safety. Pt completed dressing from EOB with set- up for UB Dressing and CGA for LB dressing to pull pants up to waist line in standing with no AD. Pt needed MOD A to don socks, MAX A to don shoes. Issued pt small compliant cubes to work on functional grasp with LUE in prep for ADL participation. Pt completed task with supervision reporting difficulty opposing L thumb. pt left supine in bed working on manipulation of cubes with bed alarm set and all needs within reach.                        Therapy Documentation Precautions:  Precautions Precautions: Fall Precaution Comments: left side weakness, dysarthric Restrictions Weight Bearing Restrictions: No  Pain: no pain reported during session     Therapy/Group: Individual Therapy  Pollyann Glen [facility] 06/09/2021, 11:53 AM

## 2021-06-09 NOTE — Progress Notes (Signed)
Physical Therapy Session Note  Patient Details  Name: Allen Torres MRN: [id number] Date of Birth: Jan 15, 1941  Today's Date: 06/09/2021 PT Individual Time: 1130-1210 PT Individual Time Calculation (min): 40 min   Short Term Goals: Week 1:  PT Short Term Goal 1 (Week 1): STG=LTG due to ELOS.  Skilled Therapeutic Interventions/Progress Updates:  Patient supine in bed on entrance to room. Patient alert and agreeable to PT session. Relates feeling down about his "situation". Relates he has always been one who takes care of others and is very independent, and finds it hard to be the one who now requires assistance. Provided emotional support, therapeutic alliance to educate that pt may need to accept help for now, but with continued hard work toward progress, can return to providing support for others after he recovers. Pt appears to be progressing well.   Patient with no pain complaint throughout session.  Therapeutic Activity: Bed Mobility: Patient performed supine <> sit with supervision/ Mod I and no vc required for technique or effort. Transfers: Patient performed sit<>stand and stand pivot transfers throughout session with supervision. Provided verbal cues for reducing BLE extensor push into seat.  Gait Training:  Patient ambulated >600 ft using RW with CGA/ close supervision, then 180 deg turn and return trip with no AD. Demonstrated adequate but lower step height/ length with LLE than RLE. Provided vc/ tc for maintaining consistent pace and good step quality with LLE. Performs curb step and amb up/ down ramp in ortho gym with CGA/ close supervision and min cues for safety in curb step. All with no AD.   Toilet transfer performed on return to room with close supervision. Clothing mgmt with supervision and vc for LUE. Attempt for BM but pt unable. Requests nectar thickened water to assist with increasing water intake as pt requires supervision and knows that he is not drinking enough water.  Nectar thick water supplied and pt able to consume 4oz cup prior to lunch arrival.   Patient seated on EOB at end of session with brakes locked, bed alarm set, and all needs within reach with NT present for lunch supervision.    Therapy Documentation Precautions:  Precautions Precautions: Fall Precaution Comments: left side weakness, dysarthric Restrictions Weight Bearing Restrictions: No General:   Vital Signs:   Pain: Pain Assessment Pain Scale: 0-10 Pain Score: 0-No pain  Therapy/Group: Individual Therapy  Loel Dubonnet PT, DPT 06/09/2021, 12:16 PM

## 2021-06-09 NOTE — Progress Notes (Signed)
Speech Language Pathology Daily Session Note  Patient Details  Name: Allen Torres MRN: [id number] Date of Birth: 1941/04/09  Today's Date: 06/09/2021 SLP Individual Time: 0830-0925 SLP Individual Time Calculation (min): 55 min  Short Term Goals: Week 1: SLP Short Term Goal 1 (Week 1): STGs=LTGs due to ELOS  Skilled Therapeutic Interventions: Skilled treatment session focused on dysphagia and communication goals. Upon arrival, patient was sitting EOB while consuming his breakfast meal of Dys. 2 textures with nectar-thick liquids. SLP facilitated session by providing Mod verbal cues for attention to left anterior spillage and to minimize talking with a full oral cavity resulting in intermittent overt s/s of aspiration. Recommend patient continue current diet. SLP also facilitated session by providing Mod A verbal cues for use of speech intelligibility strategies at the sentence level during an informal conversation. Patient would utilize strategies for ~3 sentences before requiring cues. Patient left upright in bed with alarm on and all needs within reach. Continue with current plan of care.      Pain Pain Assessment Pain Scale: 0-10 Pain Score: 0-No pain  Therapy/Group: Individual Therapy  Allen Torres 06/09/2021, 12:26 PM

## 2021-06-09 NOTE — Progress Notes (Signed)
Physical Therapy Session Note  Patient Details  Name: Allen Torres MRN: [id number] Date of Birth: [birth year]  Today's Date: 06/09/2021 PT Individual Time: 0800-0830; [id number] PT Individual Time Calculation (min): 30 min and 28 mins   Short Term Goals: Week 1:  PT Short Term Goal 1 (Week 1): STG=LTG due to ELOS.  Skilled Therapeutic Interventions/Progress Updates:    Session 1: Patient received supine in bed, very frustrated, reporting that hospital has him on a "starvation diet." He denies pain. PT offering to help patient with breakfast. He was able to come sit edge of bed with supervision. PT setting patient up with breakfast. He required grossly Max verbal cuing to clear mouth before speaking or taking another bite, cues to clear pocketing as well. Patient with appropriate sitting balance noted. No overt signs of aspiration. Handoff to NT to continue with full supervision.   Session 2: Patient received supine in bed, agreeable to PT. He denies pain. Patient coming to sit edge of bed with supervision. He ambulated to dayroom with RW and CGA. He completed 12 mins on NuStep with B UE/LE for improved large amplitude reciprocal motion. Patient ambulating back to his room with no AD and CGA. Remaining in bed, bed alarm on, call light within reach, RN at bedside.   Therapy Documentation Precautions:  Precautions Precautions: Fall Precaution Comments: left side weakness, dysarthric Restrictions Weight Bearing Restrictions: No    Therapy/Group: Individual Therapy  Karoline Caldwell, PT, DPT, CBIS  06/09/2021, 7:47 AM

## 2021-06-10 DIAGNOSIS — I10 Essential (primary) hypertension: Secondary | ICD-10-CM | POA: Diagnosis not present

## 2021-06-10 DIAGNOSIS — E871 Hypo-osmolality and hyponatremia: Secondary | ICD-10-CM

## 2021-06-10 DIAGNOSIS — N1831 Chronic kidney disease, stage 3a: Secondary | ICD-10-CM | POA: Diagnosis not present

## 2021-06-10 DIAGNOSIS — I639 Cerebral infarction, unspecified: Secondary | ICD-10-CM | POA: Diagnosis not present

## 2021-06-10 NOTE — Progress Notes (Signed)
Speech Language Pathology Daily Session Note  Patient Details  Name: Allen Torres MRN: [id number] Date of Birth: 01-01-1941  Today's Date: 06/10/2021 SLP Individual Time: 1445-1530 SLP Individual Time Calculation (min): 45 min  Short Term Goals: Week 1: SLP Short Term Goal 1 (Week 1): STGs=LTGs due to ELOS  Skilled Therapeutic Interventions:  Pt was seen for skilled ST targeting goals for dysphagia and speech intelligibility.  Pt was laying bed upon arrival, awake, alert, and agreeable to participating in treatment.  Pt repositioned himself to sitting at the edge of the bed to maximize swallowing safety for trials of thin liquids.  Pt consumed cup sips of water following oral care with x1 immediate cough which SLP suspects is attributable to decreased control of bolus in the setting of oral motor weakness.  During functional conversations with SLP, pt needed min verbal cues to slow rate of speech to achieve intelligibility.  Pt was left laying in bed at the end of today's session with bed alarm set and call bell within reach.  Continue per current plan of care.    Pain Pain Assessment Pain Scale: 0-10 Pain Score: 0-No pain  Therapy/Group: Individual Therapy  Marcelle Hepner, Melanee Spry 06/10/2021, 4:44 PM

## 2021-06-10 NOTE — Progress Notes (Signed)
PROGRESS NOTE   Subjective/Complaints: Patient seen sitting up in bed this morning.  He states he slept well overnight.  He states he really wants breakfast.  Educated patient on dysphagia diet.  He also requests to increase the temperature in his room.  ROS: Denies CP, SOB, N/V/D  Objective:   No results found. No results for input(s): WBC, HGB, HCT, PLT in the last 72 hours.  No results for input(s): NA, K, CL, CO2, GLUCOSE, BUN, CREATININE, CALCIUM in the last 72 hours.   Intake/Output Summary (Last 24 hours) at 06/10/2021 1226 Last data filed at 06/10/2021 0800 Gross per 24 hour  Intake 960 ml  Output --  Net 960 ml         Physical Exam: Vital Signs Blood pressure 134/70, pulse 71, temperature 98.6 F (37 C), temperature source Oral, resp. rate 18, height 5\' 9"  (1.753 m), weight 91.5 kg, SpO2 96 %. Gen: no distress, normal appearing,  HEENT: oral mucosa pink and moist, NCAT Cardio: Reg rate Chest: normal effort, normal rate of breathing Abd: soft, non-distended Ext: no edema Psych: pleasant, normal affect Skin: intact Neurological: Alert Dysarthria LUE clumsiness.  Right side strength 5/5, left side 4/5. Sensation intact bilaterally.   Assessment/Plan: 1. Functional deficits which require 3+ hours per day of interdisciplinary therapy in a comprehensive inpatient rehab setting. Physiatrist is providing close team supervision and 24 hour management of active medical problems listed below. Physiatrist and rehab team continue to assess barriers to discharge/monitor patient progress toward functional and medical goals  Care Tool:  Bathing    Body parts bathed by patient: Left arm, Chest, Abdomen, Front perineal area, Buttocks, Left upper leg, Right upper leg, Left lower leg, Right lower leg, Face, Right arm   Body parts bathed by helper: Right arm     Bathing assist Assist Level: Minimal Assistance -  Patient > 75%     Upper Body Dressing/Undressing Upper body dressing   What is the patient wearing?: Pull over shirt    Upper body assist Assist Level: Set up assist    Lower Body Dressing/Undressing Lower body dressing      What is the patient wearing?: Pants, Underwear/pull up     Lower body assist Assist for lower body dressing: Contact Guard/Touching assist     Toileting Toileting    Toileting assist Assist for toileting: Minimal Assistance - Patient > 75%     Transfers Chair/bed transfer  Transfers assist     Chair/bed transfer assist level: Contact Guard/Touching assist     Locomotion Ambulation   Ambulation assist      Assist level: Minimal Assistance - Patient > 75% Assistive device: No Device Max distance: 400 ft   Walk 10 feet activity   Assist     Assist level: Minimal Assistance - Patient > 75% Assistive device: No Device   Walk 50 feet activity   Assist    Assist level: Minimal Assistance - Patient > 75% Assistive device: No Device    Walk 150 feet activity   Assist    Assist level: Minimal Assistance - Patient > 75% Assistive device: No Device    Walk 10 feet on uneven  surface  activity   Assist     Assist level: Minimal Assistance - Patient > 75%     Wheelchair     Assist Is the patient using a wheelchair?: Yes Type of Wheelchair: Manual    Wheelchair assist level: Minimal Assistance - Patient > 75% Max wheelchair distance: 55 ft    Wheelchair 50 feet with 2 turns activity    Assist        Assist Level: Minimal Assistance - Patient > 75%   Wheelchair 150 feet activity     Assist      Assist Level: Total Assistance - Patient < 25%   Blood pressure 134/70, pulse 71, temperature 98.6 F (37 C), temperature source Oral, resp. rate 18, height 5\' 9"  (1.753 m), weight 91.5 kg, SpO2 96 %.    Medical Problem List and Plan: 1. 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  Antithrombotics: -DVT/anticoagulation:  Pharmaceutical: Heparin             -antiplatelet therapy: Plavix and aspirin 81 mg for 3 weeks then aspirin alone 3. Pain Management: Tylenol 4. Mood: LCSW to evaluate and provide emotional support             -antipsychotic agents: n/a 5. Neuropsych: This patient is capable of making decisions on his own behalf. 6. Skin/Wound Care: Routine skin care checks 7. Fluids/Electrolytes/Nutrition: routine Is and Os 8. Hypertension: continue atenolol,doxazosin             --recommended avoiding chlorthalidone secondary to hyponatremia             -- Lisinopril held  Controlled on 2/25 9: Hyperlipidemia: refusing meds due to side effects. Provided list of foods and supplements that can help lower LDL.  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 Overweight: provide dietary education.  12: Constipation: scheduled senna and PRNs. Add daily milk of mag 49mL. Mushy BM on 2/23. 13: Gout: No current flare.  Allopurinol held, discussed avoiding soda products/added sugar.  14. BPH: Increase Flomax to 0.8mg  HS. Monitor for loose stools as he had this reaction to HCTZ.  15. Hypomagnesemia: discussed with him that his magnesium levels are low. Start magnesium gluconate 250mg  HS 16.  Hyponatremia Sodium 133 on 2/22, continue to monitor    LOS: 4 days A FACE TO FACE EVALUATION WAS PERFORMED  Miko Markwood Lorie Phenix 06/10/2021, 12:26 PM

## 2021-06-10 NOTE — Progress Notes (Signed)
Occupational Therapy Session Note  Patient Details  Name: Allen Torres MRN: [id number] Date of Birth: 15-Jun-1940  Today's Date: 06/10/2021 OT Individual Time: 1030-1130 OT Individual Time Calculation (min): 60 min    Short Term Goals: Week 1:  OT Short Term Goal 1 (Week 1): Pt will don shirt with (S) OT Short Term Goal 2 (Week 1): Pt will complete a toilet transfer with CGA OT Short Term Goal 3 (Week 1): Pt will complete toileting tasks with CGA OT Short Term Goal 4 (Week 1): Pt will complete LB dressing with (S)  Skilled Therapeutic Interventions/Progress Updates:    Pt received in bed reporting feeling down in general and frustrated with not being able ot drink anything. OT educated about water protocol from SLP and pt completes oral care with OT and drink 7 oz of water and 3 instances of delayed cough s/s of aspiration throughout session. Pt completes shaving at sink with HOH A for increased force of LUE to spray shaving cream onto R hand. Pt shaves face with RUE well with no safety concerns seated for improved focus on UE movements and safety. Pt doff and dons shirt with slose supervision with VC for L hemi strategies in standing and dons pants at sit to stand level with set up. MOD A for donning slip on shoes. Exited session with pt seated in w/c, exit alarm on and call light in reach   Therapy Documentation Precautions:  Precautions Precautions: Fall Precaution Comments: left side weakness, dysarthric Restrictions Weight Bearing Restrictions: No    Therapy/Group: Individual Therapy  Shon Hale 06/10/2021, 10:46 AM

## 2021-06-10 NOTE — Progress Notes (Signed)
Physical Therapy Session Note  Patient Details  Name: Allen Torres MRN: [id number] Date of Birth: 26-Sep-1940  Today's Date: 06/10/2021 PT Individual Time: 0800-0904; [id number] PT Individual Time Calculation (min): 64 min , 47 min  Short Term Goals: Week 1:  PT Short Term Goal 1 (Week 1): STG=LTG due to ELOS.  Skilled Therapeutic Interventions/Progress Updates:  Tx 1:  Pt resting in bed.  He denied pain. Pt sat up with supervision, slowly.  He donned shoes with max assist.  Shoes do not open up completely, even when laces are loosened.  Sit> stand with CGA.   Seated neuromuscular re-education via demo, mirror feedback, and multimodal cues for bil scapular retraction, bil heel raises. Gait training without AD to gym without AD, CGA/min assist occasionally.  Advanced gait training to return to room, transporting tote bag on L shoulder with L thumb securely holding straps, CGa.   Therapeutic activity to facilitate trunk shortening/lengthening/rotating: in sitting with bil LE support, reaching out of BOS with L/R hand to grasp small cones, x 8 each.  No LOB, but effortful.  For pelvic dissociation: reciprocal scooting forward/backward, with bil LE support> without LE support.  Manual assistance for full pelvic movements on L.  Sit>< stand without use of hands (Bobath method) x 5, with supervision.  Up/down 12 steps, R rail, , 6' and 3" high.  Min assist due to difficulty putting entire L foot onto tread when ascending, and clearing L heel when descending. Pt with decreased attention L hemibody.  With cueing, and demo LLE movements safer.  Self stretching L hamstrings, seated, using foot stool, x 2 minutes.  At end of session, pt seated in recliner with seat belt alarm set and needs at hand.  PT informed Janett Billow, NT that pt was ready to eat breakfast.  Tx 2:  Pt seated in wc.  He denied pain.  Pt donned flannel shirt in sitting with mod assist.  Sit> stand with CGA.  Gait training without  AD on level tile x 100', x 2 within session with CGA.  neuromuscular re-education via forced use, mirror feedback, multimodal cues for use of Kinetron for alternating reciprocal movement, in sitting without use of UEs at 30 cm/sec resistance, x 3 minutes. In standing at 50 cm/sec, with bil UE support> no UE support, min assist for wt shifting R><L x 2 minutes. Also, with full wt bearing LLE, L knee flexion/extension minimal excursion. Seated bil adductor squeezes with 3 second hold x 15.   At end of session, pt seated in wc with needs at hand and seat belt alarm set.       Therapy Documentation Precautions:  Precautions Precautions: Fall Precaution Comments: left side weakness, dysarthric Restrictions Weight Bearing Restrictions: No       Therapy/Group: Individual Therapy  Perri Aragones 06/10/2021, 10:15 AM

## 2021-06-11 NOTE — Progress Notes (Incomplete)
Occupational Therapy Session Note  Patient Details  Name: Allen Torres MRN: [id number] Date of Birth: [birth year]  Today's Date: 06/11/2021 OT Group Time:  -     Skilled Therapeutic Interventions/Progress Updates: Pt engaged in therapeutic w/c level dance group focusing on patient choice, UE/LE strengthening, salience, activity tolerance, and social participation. Pt was guided through various dance-based exercises involving UEs/LEs and trunk. All music was selected by group members. Emphasis placed on       Therapy Documentation Precautions:  Precautions Precautions: Fall Precaution Comments: left side weakness, dysarthric Restrictions Weight Bearing Restrictions: No  Pain:   ADL: ADL Eating: Set up, Supervision/safety Where Assessed-Eating: Bed level Grooming: Minimal assistance Where Assessed-Grooming: Sitting at sink Upper Body Bathing: Minimal assistance, Minimal cueing Where Assessed-Upper Body Bathing: Sitting at sink Lower Body Bathing: Minimal assistance, Minimal cueing Where Assessed-Lower Body Bathing: Sitting at sink Upper Body Dressing: Minimal assistance Where Assessed-Upper Body Dressing: Sitting at sink Lower Body Dressing: Minimal assistance Where Assessed-Lower Body Dressing: Sitting at sink, Standing at sink Toileting: Moderate assistance Where Assessed-Toileting: Teacher, adult education: Minimal assistance, Minimal verbal cueing Toilet Transfer Method: Proofreader: Grab bars Tub/Shower Transfer: Unable to assess Tub/Shower Transfer Method: Unable to assess Therapy/Group: Group Therapy  Lorretta Kerce A Vaneta Hammontree 06/11/2021, 7:19 PM

## 2021-06-12 DIAGNOSIS — I639 Cerebral infarction, unspecified: Secondary | ICD-10-CM | POA: Diagnosis not present

## 2021-06-12 MED ORDER — ORAL CARE MOUTH RINSE
15.0000 mL | Freq: Two times a day (BID) | OROMUCOSAL | Status: DC
  Administered 2021-06-13: 15 mL via OROMUCOSAL

## 2021-06-12 MED ORDER — CHLORHEXIDINE GLUCONATE 0.12 % MT SOLN
15.0000 mL | Freq: Two times a day (BID) | OROMUCOSAL | Status: DC
  Administered 2021-06-12 – 2021-06-13 (×2): 15 mL via OROMUCOSAL
  Filled 2021-06-12 (×4): qty 15

## 2021-06-12 MED ORDER — BIOTENE DRY MOUTH MT LIQD: 15.0000 mL | OROMUCOSAL | Status: DC | PRN

## 2021-06-12 MED ORDER — MAGNESIUM GLUCONATE 500 MG PO TABS
500.0000 mg | ORAL_TABLET | Freq: Every day | ORAL | Status: DC
Start: 2021-06-12 — End: 2021-06-14
  Administered 2021-06-12 – 2021-06-13 (×2): 500 mg via ORAL
  Filled 2021-06-12 (×2): qty 1

## 2021-06-12 NOTE — Progress Notes (Signed)
Family reports that pt is upset, says has dry mouth and everyone removes drinks out of reach and cant reach items. Explained that is full supv with meals and drinks, explained that equipment is moved out of reach for safety because he is to call for assistance. Informed that would request oral care and has PRN biotene for dry mouth.

## 2021-06-12 NOTE — Progress Notes (Signed)
Occupational Therapy Session Note  Patient Details  Name: Allen Torres MRN: [id number] Date of Birth: 1940/09/26  Today's Date: 06/12/2021 OT Missed Time: 60 Minutes Missed Time Reason: Unavailable (comment) (neuropsych visit)   Skilled Therapeutic Interventions/Progress Updates:    Pt still in neuropsych visit when tech attempted to transport pt to group. Time missed due to pt being unavailable.   Therapy Documentation Precautions:  Precautions Precautions: Fall Precaution Comments: left side weakness, dysarthric Restrictions Weight Bearing Restrictions: No Pain: Pain Assessment Pain Scale: 0-10 Pain Score: 0-No pain ADL: ADL Eating: Set up, Supervision/safety Where Assessed-Eating: Bed level Grooming: Minimal assistance Where Assessed-Grooming: Sitting at sink Upper Body Bathing: Minimal assistance, Minimal cueing Where Assessed-Upper Body Bathing: Sitting at sink Lower Body Bathing: Minimal assistance, Minimal cueing Where Assessed-Lower Body Bathing: Sitting at sink Upper Body Dressing: Minimal assistance Where Assessed-Upper Body Dressing: Sitting at sink Lower Body Dressing: Minimal assistance Where Assessed-Lower Body Dressing: Sitting at sink, Standing at sink Toileting: Moderate assistance Where Assessed-Toileting: Teacher, adult education: Minimal assistance, Minimal verbal cueing Toilet Transfer Method: Proofreader: Grab bars Tub/Shower Transfer: Unable to assess Tub/Shower Transfer Method: Unable to assess   Therapy/Group: Group Therapy  Kateena Degroote A Jaque Dacy 06/12/2021, 1:00 PM

## 2021-06-12 NOTE — Progress Notes (Signed)
Physical Therapy Session Note  Patient Details  Name: Allen Torres MRN: [id number] Date of Birth: 1940-12-09  Today's Date: 06/12/2021 PT Individual Time: 1330-1419 PT Individual Time Calculation (min): 49 min   Short Term Goals: Week 1:  PT Short Term Goal 1 (Week 1): STG=LTG due to ELOS.  Skilled Therapeutic Interventions/Progress Updates:    Session focused on education in preparation for upcoming d/c. Pt denies concerns regards to mobility. Explained his home set-up and overall thoughts about safety, etc. Pt reports his family has the ramp fixed, so he will not have to do stairs to enter the home. He states he generally avoided stairs in the past anyway. Pt donned shoes independently EOB to prepare for therapy session (assist with tieing of R shoelace for time management). Pt performed sit <> stand with supervision to modified independent throughout session and overall supervision for transfers. Did not sure AD for therapy session to increase challenge to balance/fall reactions and challenge patient dynamically. Recommending RW for home due to fall risk and pt in agreement.   Administered fall risk assessments including Berg Balance Assessment and (see results below) and reviewed with patient. Pt does demonstrate high fall risk but has made significant improvements in both of his scores since admission.   Pt able to gait on unit without AD with CGA during and to/from therapies with occasional mild L foot drag but pt able to self correct and verbally aware when it occurs. Pt also verbalized his limitations with balance and head turns without holding onto something for support so does demonstrate some insight into his balance deficits.   End of session handoff to RN.   Therapy Documentation Precautions:  Precautions Precautions: Fall Precaution Comments: left side weakness, dysarthric Restrictions Weight Bearing Restrictions: No  Pain:  Denies pain.  Balance: Berg  Balance Test Sit to Stand: Able to stand without using hands and stabilize independently Standing Unsupported: Able to stand 2 minutes with supervision Sitting with Back Unsupported but Feet Supported on Floor or Stool: Able to sit safely and securely 2 minutes Stand to Sit: Controls descent by using hands Transfers: Able to transfer safely, definite need of hands Standing Unsupported with Eyes Closed: Able to stand 10 seconds with supervision Standing Ubsupported with Feet Together: Able to place feet together independently and stand for 1 minute with supervision From Standing, Reach Forward with Outstretched Arm: Can reach confidently >25 cm (10") From Standing Position, Pick up Object from Floor: Able to pick up shoe, needs supervision From Standing Position, Turn to Look Behind Over each Shoulder: Looks behind from both sides and weight shifts well Turn 360 Degrees: Able to turn 360 degrees safely but slowly Standing Unsupported, Alternately Place Feet on Step/Stool: Able to complete >2 steps/needs minimal assist Standing Unsupported, One Foot in Front: Needs help to step but can hold 15 seconds Standing on One Leg: Unable to try or needs assist to prevent fall Total Score: 38   6 Min Walk Test:  Instructed patient to ambulate as quickly and as safely as possible for 6 minutes using LRAD. Patient was allowed to take standing rest breaks without stopping the test, but if the patient required a sitting rest break the clock would be stopped and the test would be over.  Results: 664 feet (202.4 meters, Avg speed 0.56 m/s) using no AD with close supervision/CGA. Results indicate that the patient improved since admission with a clinically significant difference. Results indicate that the patient has reduced endurance with ambulation  compared to age matched norms.   Age Matched Norms: 83-69 yo M: 73 F: 53, 4-79 yo M: 29 F: 471, 58-89 yo M: 417 F: 392 MDC: 58.21 meters (190.98 feet) or 50  meters (ANPTA Core Set of Outcome Measures for Adults with Neurologic Conditions, 2018)   Therapy/Group: Individual Therapy  Karolee Stamps Darrol Poke, PT, DPT, CBIS  06/12/2021, 2:58 PM

## 2021-06-12 NOTE — Progress Notes (Signed)
Patient ID: Allen Torres, male   DOB: 01/26/1941, 80 y.o.   MRN: [id number]  Met with pt and son who is in his room to discuss equipment needs and follow up. Pt report she does not have a rolling walker and is in agreement with this worker getting one for him.Both felt home health would be better than OP, due to pt does not want to bother his children.  No preference for either. Pt feels he is voiding better and has no plans to do I & O cath or a foley at home. Discuss at team conference tomorrow. °

## 2021-06-12 NOTE — Discharge Summary (Signed)
Physician Discharge Summary  Patient ID: Allen Torres MRN: [id number] DOB/AGE: 1940/09/11 81 y.o.  Admit date: 06/06/2021  Discharge date: 06/14/2021  Discharge Diagnoses:  Principal Problem:   Ischemic stroke [facility]) Active Problems:   Stage 3a chronic kidney disease ([place])   Acute embolic stroke ([place]) Hypertension Hyperlipidemia Chronic kidney disease stage III AAA Overweight Constipation Gout Benign prostatic hypertrophy Hypomagnesemia Hyponatremia Dry mouth Emotional lability  Discharged Condition: stable  Significant Diagnostic Studies: MR BRAIN WO CONTRAST  Result Date: 06/02/2021 CLINICAL DATA:  Acute neurologic deficit EXAM: MRI HEAD WITHOUT CONTRAST TECHNIQUE: Multiplanar, multiecho pulse sequences of the brain and surrounding structures were obtained without intravenous contrast. COMPARISON:  Head CT 06/01/2021 FINDINGS: Brain: Small area of abnormal diffusion restriction in the right centrum semiovale. No acute or chronic hemorrhage. There is multifocal hyperintense T2-weighted signal within the white matter. Generalized volume loss without a clear lobar predilection. The midline structures are normal. Vascular: Major flow voids are preserved. Skull and upper cervical spine: Normal calvarium and skull base. Visualized upper cervical spine and soft tissues are normal. Sinuses/Orbits:No paranasal sinus fluid levels or advanced mucosal thickening. No mastoid or middle ear effusion. Normal orbits. IMPRESSION: 1. Small area of acute/subacute ischemia in the right centrum semiovale. No hemorrhage or mass effect. 2. Findings of chronic microvascular ischemia and generalized volume loss. Electronically Signed   By: Ulyses Jarred M.D.   On: 06/02/2021 00:52   DG Swallowing Func-Speech Pathology  Result Date: 06/13/2021 Table formatting from the original result was not included. Objective Swallowing Evaluation: Type of Study: Bedside Swallow Evaluation  Patient Details Name: Allen Torres MRN: [id number] Date of Birth: 1940/05/18 Today's Date: 06/13/2021 Past Medical History: Past Medical History: Diagnosis Date  Hypertension   Renal disorder   Patient states, "chronic kidney disease" Past Surgical History: Past Surgical History: Procedure Laterality Date  BIOPSY  03/16/2020  Procedure: BIOPSY;  Surgeon: Yetta Flock, MD;  Location: [facility];  Service: Gastroenterology;;  CHOLECYSTECTOMY N/A 03/18/2020  Procedure: LAPAROSCOPIC CHOLECYSTECTOMY;  Surgeon: Johnathan Hausen, MD;  Location: [facility];  Service: General;  Laterality: N/A;  ESOPHAGOGASTRODUODENOSCOPY N/A 03/16/2020  Procedure: ESOPHAGOGASTRODUODENOSCOPY (EGD);  Surgeon: Yetta Flock, MD;  Location: [facility];  Service: Gastroenterology;  Laterality: N/A;  facial cystectomy   HPI: See H&P  Subjective: emotional  Recommendations for follow up therapy are one component of a multi-disciplinary discharge planning process, led by the attending physician.  Recommendations may be updated based on patient status, additional functional criteria and insurance authorization. Assessment / Plan / Recommendation Clinical Impressions 06/13/2021 Clinical Impression Patient demonstrates a mild oropharyngeal dysphagia. Oral phase is characterized by prolonged mastication with oral residue with solid textures and decreased bolus cohesion of thin liquids when taking large, sequential sips via straw.  Pharyngeal phase is characterized by inconsistent timing of swallow initiation resulting in one episode of aspiration with mixed consistencies when utilizing a straw. Aspiration dropped below the cords during the swallow but remained on the cords after that swallow was complete. Aspirated were cleared with a cued throat clear. No further aspiration observed with mixed consistencies when straw was removed. Recommend patient continue current diet of Dys. 2 textures but upgrade to thin liquids via cup. SLP Visit Diagnosis Dysphagia, oropharyngeal  phase (R13.12) Attention and concentration deficit following -- Frontal lobe and executive function deficit following -- Impact on safety and function Mild aspiration risk   Treatment Recommendations 06/13/2021 Treatment Recommendations Therapy as outlined in treatment plan below   Prognosis 06/13/2021 Prognosis for  Safe Diet Advancement Good Barriers to Reach Goals -- Barriers/Prognosis Comment -- Diet Recommendations 06/13/2021 SLP Diet Recommendations Dysphagia 2 (Fine chop) solids;Thin liquid Liquid Administration via Cup;No straw Medication Administration Crushed with puree Compensations Slow rate;Small sips/bites;Lingual sweep for clearance of pocketing;Minimize environmental distractions;Follow solids with liquid;Monitor for anterior loss Postural Changes Seated upright at 90 degrees   Other Recommendations 06/13/2021 Recommended Consults -- Oral Care Recommendations Oral care BID Other Recommendations -- Follow Up Recommendations Outpatient SLP Assistance recommended at discharge Frequent or constant Supervision/Assistance Functional Status Assessment Patient has had a recent decline in their functional status and demonstrates the ability to make significant improvements in function in a reasonable and predictable amount of time. Frequency and Duration  06/13/2021 Speech Therapy Frequency (ACUTE ONLY) min 2x/week Treatment Duration 1 week   Oral Phase 06/13/2021 Oral Phase Impaired Oral - Pudding Teaspoon -- Oral - Pudding Cup -- Oral - Honey Teaspoon -- Oral - Honey Cup -- Oral - Nectar Teaspoon -- Oral - Nectar Cup NT Oral - Nectar Straw -- Oral - Thin Teaspoon WFL Oral - Thin Cup WFL Oral - Thin Straw Decreased bolus cohesion Oral - Puree WFL Oral - Mech Soft Impaired mastication;Delayed oral transit Oral - Regular NT Oral - Multi-Consistency Impaired mastication;Decreased bolus cohesion;Piecemeal swallowing Oral - Pill -- Oral Phase - Comment --  Pharyngeal Phase 06/13/2021 Pharyngeal Phase Impaired Pharyngeal-  Pudding Teaspoon -- Pharyngeal -- Pharyngeal- Pudding Cup -- Pharyngeal -- Pharyngeal- Honey Teaspoon -- Pharyngeal -- Pharyngeal- Honey Cup -- Pharyngeal -- Pharyngeal- Nectar Teaspoon -- Pharyngeal -- Pharyngeal- Nectar Cup NT Pharyngeal -- Pharyngeal- Nectar Straw NT Pharyngeal -- Pharyngeal- Thin Teaspoon WFL Pharyngeal -- Pharyngeal- Thin Cup [facility] Pharyngeal Material does not enter airway Pharyngeal- Thin Straw Delayed swallow initiation-pyriform sinuses;Penetration/Aspiration during swallow Pharyngeal Material does not enter airway;Material enters airway, passes BELOW cords then ejected out Pharyngeal- Puree Delayed swallow initiation-vallecula Pharyngeal -- Pharyngeal- Mechanical Soft Delayed swallow initiation-vallecula Pharyngeal -- Pharyngeal- Regular NT Pharyngeal -- Pharyngeal- Multi-consistency Delayed swallow initiation-vallecula Pharyngeal -- Pharyngeal- Pill -- Pharyngeal -- Pharyngeal Comment --  Cervical Esophageal Phase  06/13/2021 Cervical Esophageal Phase WFL Pudding Teaspoon -- Pudding Cup -- Honey Teaspoon -- Honey Cup -- Nectar Teaspoon -- Nectar Cup -- Nectar Straw -- Thin Teaspoon -- Thin Cup -- Thin Straw -- Puree -- Mechanical Soft -- Regular -- Multi-consistency -- Pill -- Cervical Esophageal Comment -- PAYNE, COURTNEY 06/13/2021, 12:05 PM    [name], MA, CCC-SLP                  DG Swallowing Func-Speech Pathology  Result Date: 06/06/2021 Table formatting from the original result was not included. Objective Swallowing Evaluation: Type of Study: MBS-Modified Barium Swallow Study  Patient Details Name: Allen Torres MRN: [id number] Date of Birth: 09/09/1940 Today's Date: 06/06/2021 Time: SLP Start Time (ACUTE ONLY): 31 -SLP Stop Time (ACUTE ONLY): 1253 SLP Time Calculation (min) (ACUTE ONLY): 13 min Past Medical History: Past Medical History: Diagnosis Date  Hypertension   Renal disorder   Patient states, "chronic kidney disease" Past Surgical History: Past Surgical History:  Procedure Laterality Date  BIOPSY  03/16/2020  Procedure: BIOPSY;  Surgeon: Yetta Flock, MD;  Location: [facility];  Service: Gastroenterology;;  CHOLECYSTECTOMY N/A 03/18/2020  Procedure: LAPAROSCOPIC CHOLECYSTECTOMY;  Surgeon: Johnathan Hausen, MD;  Location: [facility];  Service: General;  Laterality: N/A;  ESOPHAGOGASTRODUODENOSCOPY N/A 03/16/2020  Procedure: ESOPHAGOGASTRODUODENOSCOPY (EGD);  Surgeon: Yetta Flock, MD;  Location: [facility];  Service: Gastroenterology;  Laterality: N/A;  facial  cystectomy   HPI: Pt is an 81 yo male presenting wtih L facial droop and slurred speech. MRI showed small area of acute/subacute ischemia in the R centrum semiovale. PMH includes: HTN, renal disorder. Increased coughing at bedside therefore MBS recommended.  Subjective: emotional  Recommendations for follow up therapy are one component of a multi-disciplinary discharge planning process, led by the attending physician.  Recommendations may be updated based on patient status, additional functional criteria and insurance authorization. Assessment / Plan / Recommendation Clinical Impressions 06/06/2021 Clinical Impression Pt exhibits mild oropharyngeal dysphagia with trace aspiration. Decreased containment resulted in sublingual loss with  re establish lingual position for bolus transit with liquid consistencies. Piecemeal transit pattern demonstrated with nectar. Solid bolus mastication was prolonged but complete without significant residue. Laryngeal closure was full however late with thin penetrated to cords and aspirate after without sensation to cough but occasional weak and non productive throat clear. Aspiration present during chin tuck trial. Coordinated and cohesive pharyngeal transit observed with nectar thick barium consistently. Therapist recommends nectar thick liquids, continue Dys 2 texture, pills crushed, and full fading to intermittent assist with meals. Trial of RMT may be attempted. SLP Visit  Diagnosis Dysphagia, oropharyngeal phase (R13.12) Attention and concentration deficit following -- Frontal lobe and executive function deficit following -- Impact on safety and function Mild aspiration risk;Moderate aspiration risk   Treatment Recommendations 06/06/2021 Treatment Recommendations Therapy as outlined in treatment plan below   Prognosis 06/06/2021 Prognosis for Safe Diet Advancement Good Barriers to Reach Goals -- Barriers/Prognosis Comment -- Diet Recommendations 06/06/2021 SLP Diet Recommendations Dysphagia 2 (Fine chop) solids;Nectar thick liquid Liquid Administration via Cup;Straw Medication Administration Crushed with puree Compensations Slow rate;Small sips/bites;Lingual sweep for clearance of pocketing Postural Changes Seated upright at 90 degrees   Other Recommendations 06/06/2021 Recommended Consults -- Oral Care Recommendations Oral care BID Other Recommendations -- Follow Up Recommendations Acute inpatient rehab (3hours/day) Assistance recommended at discharge Intermittent Supervision/Assistance Functional Status Assessment Patient has had a recent decline in their functional status and demonstrates the ability to make significant improvements in function in a reasonable and predictable amount of time. Frequency and Duration  06/06/2021 Speech Therapy Frequency (ACUTE ONLY) min 2x/week Treatment Duration 2 weeks   Oral Phase 06/06/2021 Oral Phase Impaired Oral - Pudding Teaspoon -- Oral - Pudding Cup -- Oral - Honey Teaspoon -- Oral - Honey Cup -- Oral - Nectar Teaspoon -- Oral - Nectar Cup Piecemeal swallowing;Decreased bolus cohesion;Other (Comment) Oral - Nectar Straw -- Oral - Thin Teaspoon -- Oral - Thin Cup Decreased bolus cohesion Oral - Thin Straw -- Oral - Puree -- Oral - Mech Soft -- Oral - Regular Delayed oral transit Oral - Multi-Consistency -- Oral - Pill -- Oral Phase - Comment --  Pharyngeal Phase 06/06/2021 Pharyngeal Phase Impaired Pharyngeal- Pudding Teaspoon -- Pharyngeal --  Pharyngeal- Pudding Cup -- Pharyngeal -- Pharyngeal- Honey Teaspoon -- Pharyngeal -- Pharyngeal- Honey Cup -- Pharyngeal -- Pharyngeal- Nectar Teaspoon -- Pharyngeal -- Pharyngeal- Nectar Cup WFL Pharyngeal -- Pharyngeal- Nectar Straw WFL Pharyngeal -- Pharyngeal- Thin Teaspoon -- Pharyngeal -- Pharyngeal- Thin Cup Penetration/Aspiration during swallow;Compensatory strategies attempted (with notebox) Pharyngeal Material enters airway, CONTACTS cords and not ejected out;Material enters airway, passes BELOW cords without attempt by patient to eject out (silent aspiration) Pharyngeal- Thin Straw -- Pharyngeal -- Pharyngeal- Puree -- Pharyngeal -- Pharyngeal- Mechanical Soft -- Pharyngeal -- Pharyngeal- Regular WFL Pharyngeal -- Pharyngeal- Multi-consistency -- Pharyngeal -- Pharyngeal- Pill -- Pharyngeal -- Pharyngeal Comment --  Cervical Esophageal Phase  06/06/2021 Cervical Esophageal Phase WFL Pudding Teaspoon -- Pudding Cup -- Honey Teaspoon -- Honey Cup -- Nectar Teaspoon -- Nectar Cup -- Nectar Straw -- Thin Teaspoon -- Thin Cup -- Thin Straw -- Puree -- Mechanical Soft -- Regular -- Multi-consistency -- Pill -- Cervical Esophageal Comment -- [name] 06/06/2021, 1:53 PM                     ECHOCARDIOGRAM COMPLETE  Result Date: 06/03/2021    ECHOCARDIOGRAM REPORT   Patient Name:   Allen Torres Date of Exam: 06/03/2021 Medical Rec #:  [id number]     Height:       71.0 in Accession #:    [id number]    Weight:       240.0 lb Date of Birth:  1941/02/07    BSA:          2.278 m Patient Age:    2 years      BP:           122/52 mmHg Patient Gender: M             HR:           70 bpm. Exam Location:  Inpatient Procedure: 2D Echo, Cardiac Doppler and Color Doppler Indications:    Stroke I63.9  History:        Patient has no prior history of Echocardiogram examinations.                 Risk Factors:Hypertension.  Sonographer:    Bernadene Person RDCS Referring Phys: [facility]  1. Left  ventricular ejection fraction, by estimation, is 60 to 65%. The left ventricle has normal function. Left ventricular endocardial border not optimally defined to evaluate regional wall motion. 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 Left ventricular diastolic parameters are consistent with Grade I diastolic dysfunction (impaired relaxation). Elevated left atrial pressure.  2. Right ventricular systolic function is normal. The right ventricular size is normal. There is normal pulmonary artery systolic pressure.  3. The mitral valve is normal in structure. No evidence of mitral valve regurgitation. No evidence of mitral stenosis.  4. The aortic valve is tricuspid. Aortic valve regurgitation is not visualized. Mild to moderate aortic valve stenosis. Aortic valve mean gradient measures 12.0 mmHg. Aortic valve peak gradient measures 21.3 mmHg. Aortic valve area, by VTI measures 1.08  cm.     Lower than expected gradient due to decreased SVI  5. Aortic dilatation noted. There is mild dilatation of the aortic root, measuring 41 mm. 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 FINDINGS  Left Ventricle: Left ventricular ejection fraction, by estimation, is 60 to 65%. The left ventricle has normal function. Left ventricular endocardial border not optimally defined to evaluate regional wall motion. The left ventricular internal cavity size was normal in size. 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 Left ventricular diastolic parameters are consistent with Grade I diastolic dysfunction (impaired relaxation). Elevated left atrial pressure. Right Ventricle: The right ventricular size is normal. Right vetricular wall thickness was not well visualized. Right ventricular systolic function is normal. There is normal pulmonary artery systolic pressure. The tricuspid regurgitant velocity is 2.29 m/s, and with an assumed  right atrial pressure of 8 mmHg, the estimated right ventricular systolic pressure is 0000000 mmHg. Left Atrium: Left atrial size was normal in size. Right Atrium: Right atrial size was  normal in size. Pericardium: There is no evidence of pericardial effusion. Mitral Valve: The mitral valve is normal in structure. There is mild thickening of the mitral valve leaflet(s). There is mild calcification of the mitral valve leaflet(s). Mild mitral annular calcification. No evidence of mitral valve regurgitation. No evidence of mitral valve stenosis. Tricuspid Valve: The tricuspid valve is not well visualized. Tricuspid valve regurgitation is mild . No evidence of tricuspid stenosis. Aortic Valve: Lower than expected gradient due to decreased SVI. The aortic valve is tricuspid. Aortic valve regurgitation is not visualized. Mild to moderate aortic stenosis is present. Aortic valve mean gradient measures 12.0 mmHg. Aortic valve peak gradient measures 21.3 mmHg. Aortic valve area, by VTI measures 1.08 cm. Pulmonic Valve: The pulmonic valve was not well visualized. Pulmonic valve regurgitation is mild. No evidence of pulmonic stenosis. Aorta: Aortic dilatation noted. There is mild dilatation of the aortic root, measuring 41 mm. 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 Venous: The inferior vena cava was not well visualized. IAS/Shunts: The interatrial septum was not well visualized.  LEFT VENTRICLE PLAX 2D LVIDd:         4.80 cm      Diastology LVIDs:         2.90 cm      LV e' medial:    3.24 cm/s LV PW:         1.40 cm      LV E/e' medial:  18.3 LV IVS:        1.90 cm      LV e' lateral:   4.91 cm/s LVOT diam:     2.10 cm      LV E/e' lateral: 12.1 LV SV:         51 LV SV Index:   22 LVOT Area:     3.46 cm  LV Volumes (MOD) LV vol d, MOD A2C: 89.3 ml LV vol d, MOD A4C: 103.0 ml LV vol s, MOD A2C: 54.8 ml LV vol s, MOD A4C: 60.4 ml LV SV MOD A2C:     34.5 ml LV SV MOD A4C:     103.0 ml LV  SV MOD BP:      41.9 ml RIGHT VENTRICLE RV S prime:     7.10 cm/s TAPSE (M-mode): 1.5 cm LEFT ATRIUM             Index        RIGHT ATRIUM           Index LA diam:        3.90 cm 1.71 cm/m   RA Area:     13.20 cm LA Vol (A2C):   44.6 ml 19.57 ml/m  RA Volume:   27.50 ml  12.07 ml/m LA Vol (A4C):   62.3 ml 27.34 ml/m LA Biplane Vol: 53.4 ml 23.44 ml/m  AORTIC VALVE AV Area (Vmax):    1.02 cm AV Area (Vmean):   1.02 cm AV Area (VTI):     1.08 cm AV Vmax:           230.67 cm/s AV Vmean:          161.000 cm/s AV VTI:            0.470 m AV Peak Grad:      21.3 mmHg AV Mean Grad:      12.0 mmHg LVOT Vmax:         68.17 cm/s LVOT Vmean:  47.200 cm/s LVOT VTI:          0.146 m LVOT/AV VTI ratio: 0.31  AORTA Ao Root diam: 4.10 cm Ao Asc diam:  4.90 cm MITRAL VALVE                TRICUSPID VALVE MV Area (PHT): 3.34 cm     TR Peak grad:   21.0 mmHg MV Decel Time: 227 msec     TR Vmax:        229.00 cm/s MV E velocity: 59.40 cm/s MV A velocity: 124.00 cm/s  SHUNTS MV E/A ratio:  0.48         Systemic VTI:  0.15 m                             Systemic Diam: 2.10 cm Carlyle Dolly MD Electronically signed by Carlyle Dolly MD Signature Date/Time: 06/03/2021/5:34:23 PM    Final    CT HEAD CODE STROKE WO CONTRAST  Result Date: 06/01/2021 CLINICAL DATA:  Code stroke. Neuro deficit, acute, stroke suspected. EXAM: CT HEAD WITHOUT CONTRAST TECHNIQUE: Contiguous axial images were obtained from the base of the skull through the vertex without intravenous contrast. RADIATION DOSE REDUCTION: This exam was performed according to the departmental dose-optimization program which includes automated exposure control, adjustment of the mA and/or kV according to patient size and/or use of iterative reconstruction technique. COMPARISON:  None. FINDINGS: Brain: No abnormality seen affecting the brainstem or cerebellum. Cerebral hemispheres show age related atrophy with old small vessel infarctions of the thalami, basal ganglia  and hemispheric white matter. No sign of acute infarction, mass lesion, hemorrhage, hydrocephalus or extra-axial collection. Vascular: There is atherosclerotic calcification of the major vessels at the base of the brain. Skull: Negative Sinuses/Orbits: Clear/normal Other: None ASPECTS ([place] Stroke Program Early CT Score) - Ganglionic level infarction (caudate, lentiform nuclei, internal capsule, insula, M1-M3 cortex): 7 - Supraganglionic infarction (M4-M6 cortex): 3 Total score (0-10 with 10 being normal): 10 IMPRESSION: 1. No acute CT finding. 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 2. ASPECTS is 10. 3. These results were called by telephone at the time of interpretation on 06/01/2021 at 9:20 pm to provider Dr. Alvino Chapel, who verbally acknowledged these results. Electronically Signed   By: Nelson Chimes M.D.   On: 06/01/2021 21:26   CT ANGIO HEAD NECK W WO CM W PERF (CODE STROKE)  Result Date: 06/01/2021 CLINICAL DATA:  CODE STROKE EXAM: CT ANGIOGRAPHY HEAD AND NECK CT PERFUSION BRAIN TECHNIQUE: Multidetector CT imaging of the head and neck was performed using the standard protocol during bolus administration of intravenous contrast. Multiplanar CT image reconstructions and MIPs were obtained to evaluate the vascular anatomy. Carotid stenosis measurements (when applicable) are obtained utilizing NASCET criteria, using the distal internal carotid diameter as the denominator. Multiphase CT imaging of the brain was performed following IV bolus contrast injection. Subsequent parametric perfusion maps were calculated using RAPID software. RADIATION DOSE REDUCTION: This exam was performed according to the departmental dose-optimization program which includes automated exposure control, adjustment of the mA and/or kV according to patient size and/or use of iterative reconstruction technique. CONTRAST:  137mL OMNIPAQUE IOHEXOL 350 MG/ML SOLN, 43mL OMNIPAQUE IOHEXOL  350 MG/ML SOLN COMPARISON:  None. FINDINGS: CTA NECK FINDINGS SKELETON: There is no bony spinal canal stenosis. No lytic or blastic lesion. OTHER NECK: Normal pharynx, larynx and major salivary glands. No cervical lymphadenopathy. Unremarkable thyroid  gland. UPPER CHEST: No pneumothorax or pleural effusion. No nodules or masses. AORTIC ARCH: There is calcific atherosclerosis of the aortic arch. There is decreased enhancement of the visualized portion of the descending aorta. The visualized proximal subclavian arteries are widely patent. There are numerous prominent collaterals in the posterior mediastinum and paraspinous musculature. RIGHT CAROTID SYSTEM: No dissection, occlusion or aneurysm. There is mixed density atherosclerosis extending into the proximal ICA, resulting in less than 50% stenosis. LEFT CAROTID SYSTEM: Normal without aneurysm, dissection or stenosis. VERTEBRAL ARTERIES: Right dominant configuration. Both origins are clearly patent. There is no dissection, occlusion or flow-limiting stenosis to the skull base (V1-V3 segments). CTA HEAD FINDINGS POSTERIOR CIRCULATION: --Vertebral arteries: Normal V4 segments. --Inferior cerebellar arteries: Normal. --Basilar artery: Normal. --Superior cerebellar arteries: Normal. --Posterior cerebral arteries (PCA): Normal. ANTERIOR CIRCULATION: --Intracranial internal carotid arteries: Normal. --Anterior cerebral arteries (ACA): Normal. Both A1 segments are present. Patent anterior communicating artery (a-comm). --Middle cerebral arteries (MCA): Normal. VENOUS SINUSES: As permitted by contrast timing, patent. ANATOMIC VARIANTS: None Review of the MIP images confirms the above findings. CT Brain Perfusion Findings: Perfusion data is unreliable due to motion artifacts and abnormal bolus characteristics. IMPRESSION: 1. No intracranial arterial occlusion or high-grade stenosis. 2. Unreliable perfusion data due to motion artifacts and abnormal bolus characteristics. 3.  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 Further characterization with CTA of the chest is recommended. 4. Mixed density right carotid bifurcation plaque with less than 50% stenosis. Aortic Atherosclerosis (ICD10-I70.0). Electronically Signed   By: Ulyses Jarred M.D.   On: 06/01/2021 22:20    Labs:  Basic Metabolic Panel: Recent Labs  Lab 06/07/21 1039 06/08/21 0704 06/13/21 0552  NA 133*  --  138  K 4.4  --  4.2  CL 100  --  103  CO2 21*  --  22  GLUCOSE 144*  --  116*  BUN 30*  --  46*  CREATININE 1.62*  --  2.01*  CALCIUM 10.1  --  9.7  MG  --  1.8  --     CBC: Recent Labs  Lab 06/07/21 1039 06/13/21 0552  WBC 6.4 6.2  NEUTROABS 3.9  --   HGB 14.5 13.5  HCT 40.2 38.4*  MCV 94.6 96.2  PLT 232 193    CBG: No results for input(s): GLUCAP in the last 168 hours.  Brief HPI:   Allen Torres is a 81 y.o. male whose daughter found him with slurred speech and left facial droop when she returned home from work on the evening of 06/01/2021.  He also had left upper extremity weakness.  He presented to [facility] long emergency department where code stroke was called.  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  The patient was placed on Plavix and aspirin to continue dual antiplatelet therapy for 3 weeks then aspirin alone.   Hospital Course: Allen Torres was admitted to rehab 06/06/2021 for inpatient therapies to consist of PT, ST and OT at least three hours five days a week. Past admission physiatrist, therapy team and rehab RN have worked together to provide customized collaborative inpatient rehab. Urinary retention with urinary hesitancy prior to hospitalization. Increased Flomax dosing 2/24. Having bowel movements with daily MoM. Magnesium supplement started on 2/24. Patient provided with list  of foods and supplements to help lower LDL. Hyponatremia noted and monitored.  Normal serum sodium to 138 on 2/28.  He did not like side effects of milk of magnesium and this was discontinued.  Follow-up labs on 2/28 revealed normal electrolytes and mild elevation of BUN and creatinine to 46/2.01.  His dysphagia diet was advanced to thin liquids. Stable for discharge home with daughter and son.  Blood pressures were monitored on TID basis and remained in good control off Lisinopril  Rehab course: During patient's stay in rehab weekly team conferences were held to monitor patient's progress, set goals and discuss barriers to discharge. At admission, patient required minimal assist with ADLs and min Assist with CGA for mobility.  He required minimal verbal cues for use of swallowing compensatory strategies on 2/28.  He was 90 to 100% intelligible at the conversation level.  Patient was performing basic transfers overall mod 1 level with rolling walker.  He has had improvement in activity tolerance, balance, postural control as well as ability to compensate for deficits. He has had improvement in functional use RUE/LUE  and RLE/LLE as well as improvement in awareness   Disposition: Home Discharge disposition: 01-Home or Self Care      Diet: Heart healthy  Special Instructions:  Last dose of Plavix on 06/22/2021 and continue aspirin.  No driving, alcohol consumption or tobacco use.    30-35 minutes were spent on discharge planning and discharge summary.  Allergies as of 06/14/2021       Reactions   Colchicine Diarrhea   Hydrochlorothiazide    Other reaction(s): Other (See Comments) Loose stools   Indomethacin    "messed up my liver" Other reaction(s): Other (See Comments) Patient states that this messed up his liver     Statins Other (See Comments)   Patient states that he hurt so bad, hard to get up in the morning.  Felt like his system was shut down and under able to have a bowel  movement        Medication List     STOP taking these medications    bisacodyl 10 MG suppository Commonly known as: DULCOLAX   senna-docusate 8.6-50 MG tablet Commonly known as: Senokot-S       TAKE these medications    acetaminophen 325 MG tablet Commonly known as: TYLENOL Take 1-2 tablets (325-650 mg total) by mouth every 4 (four) hours as needed for mild pain.   antiseptic oral rinse Liqd 15 mLs by Mouth Rinse route as needed for dry mouth.   aspirin 81 MG EC tablet Take 1 tablet (81 mg total) by mouth daily. Swallow whole.   atenolol 25 MG tablet Commonly known as: TENORMIN Take 1 tablet (25 mg total) by mouth daily.   cholecalciferol 25 MCG (1000 UNIT) tablet Commonly known as: VITAMIN D3 Take 1,000 Units by mouth daily.   clopidogrel 75 MG tablet Commonly known as: PLAVIX Take 1 tablet (75 mg total) by mouth daily for 9 days.   doxazosin 1 MG tablet Commonly known as: CARDURA Take 1 tablet (1 mg total) by mouth every 12 (twelve) hours. What changed: You were already taking a medication with the same name, and this prescription was added. Make sure you understand how and when to take each.   doxazosin 1 MG tablet Commonly known as: CARDURA Take 1 tablet (1 mg total) by mouth every 12 (twelve) hours. What changed: Another medication with the same name was added. Make sure you understand how and when to take each.   fluticasone 50 MCG/ACT nasal spray Commonly known as:  FLONASE Place 1 spray into both nostrils daily as needed for allergies.   guaiFENesin-dextromethorphan 100-10 MG/5ML syrup Commonly known as: ROBITUSSIN DM Take 5-10 mLs by mouth every 6 (six) hours as needed for cough.   magnesium gluconate 500 MG tablet Commonly known as: MAGONATE Take 1 tablet (500 mg total) by mouth at bedtime.   meclizine 12.5 MG tablet Commonly known as: ANTIVERT Take 1 tablet (12.5 mg total) by mouth 3 (three) times daily as needed for dizziness.   senna  8.6 MG Tabs tablet Commonly known as: SENOKOT Take 1 tablet (8.6 mg total) by mouth 2 (two) times daily.   tamsulosin 0.4 MG Caps capsule Commonly known as: FLOMAX Take 2 capsules (0.8 mg total) by mouth daily after supper.   vitamin B-12 100 MCG tablet Commonly known as: CYANOCOBALAMIN Take 100 mcg by mouth daily.   vitamin C 1000 MG tablet Take 1,000 mg by mouth daily.   zinc gluconate 50 MG tablet Take 50 mg by mouth daily.        Follow-up Information     Sigurd Sos L, DO Follow up.   Specialty: Family Medicine Why: Call on Thursday to arrange hospital follow-up appointment Contact information: [place] [address] [id number]         Izora Ribas, MD Follow up.   Specialty: Physical Medicine and Rehabilitation Why: 8/7 11:00am Contact information: [id number] [place] [address] [phone]         [facility]. Call.   Specialty: Neurology Why: Call on Thursday to arrange hospital follow-up status post stroke Contact information: [address] [place] [phone]                Signed: Barbie Banner 06/14/2021, 9:35 AM

## 2021-06-12 NOTE — Progress Notes (Signed)
Speech Language Pathology Daily Session Note  Patient Details  Name: Allen Torres MRN: [id number] Date of Birth: [birth year]  Today's Date: 06/12/2021 SLP Individual Time: 0815-0900 SLP Individual Time Calculation (min): 45 min  Short Term Goals: Week 1: SLP Short Term Goal 1 (Week 1): STGs=LTGs due to ELOS  Skilled Therapeutic Interventions: Pt seen this date for skilled ST intervention targeting dysphagia management goals. Pt encountered sitting on EOB, consuming modified diet with Nurse Tech present. Pt observed with breakfast tray of Dysphagia 2 textures and nectar-thick liquids.   SLP facilitated today's session by providing Min A verbal cues for use of lingual sweep and liquid rinse to aid in oral cavity clearance given the presence of moderate oral residuals, which were not effective in clearing oral debris. As a result, SLP provided Mod verbal and physical A to utilize Y[place]auer for suctioning food particles from oral cavity. Pt verbalized difficulty with mastication in the setting of orofacial weakness, change in oral sensation, and pre-morbid edentulous status. SLP provided education re: donning of dentures and suctioning during meals; he verbalized understanding, but stated his dentures do not fit well. Suspect an acute on chronic dysphagia given pt reports of difficulty with mastication and deglutition prior to admission.   Despite prolonged oral phase and poor A-P transit, pt did not demonstrate s/sx concerning for aspiration with prescribed diet consistencies; nevertheless, remains at an elevated risk of aspiration due to oral motor weakness/sensation changes, quantity of oral residuals, and decreased safety awareness (pt required intermittent Min A verbal cues to remain upright following PO intake). Informally, improvement appreciated in pt's verbal pacing at the conversational level with no cueing required from SLP to utilize compensatory intelligibility strategies. Given pt will be  discharging home soon, will plan to repeat MBSS to assess pt's ability to upgrade liquid consistency to thins prior to discharge.  Pt left in bed with bed alarm on, and call bell + personal items in reach. Continue with current plan.  Pain Pt denies pain during today's session.  Therapy/Group: Individual Therapy  Marticia Reifschneider A Juliano Mceachin 06/12/2021, 10:11 AM

## 2021-06-12 NOTE — Progress Notes (Signed)
Occupational Therapy Session Note  Patient Details  Name: Allen Torres MRN: [id number] Date of Birth: Mar 03, 1941  Today's Date: 06/12/2021 OT Individual Time: 1502-1530 OT Individual Time Calculation (min): 28 min    Short Term Goals: Week 1:  OT Short Term Goal 1 (Week 1): Pt will don shirt with (S) OT Short Term Goal 2 (Week 1): Pt will complete a toilet transfer with CGA OT Short Term Goal 3 (Week 1): Pt will complete toileting tasks with CGA OT Short Term Goal 4 (Week 1): Pt will complete LB dressing with (S)  Skilled Therapeutic Interventions/Progress Updates:  Pt greeted  supine in bed   agreeable to OT intervention. Pt c/o not having enough water, reviewed purpose of water protocol with pt verbalizing understanding Session focus on LUE coordination and DC planning. Pt completed peg board task with pt instructed to create peg structure from visual aid provided with LUE only pt completed structure with overall supervision. Discussed DC planning and DME needs, pt reports having tub shower at home, added TTB to DME recs and plan to practice transfer tomorrow, pt agreeable. Pt left supine in bed with bed alarm activated and all needs within reach.  Therapy Documentation Precautions:  Precautions Precautions: Fall Precaution Comments: left side weakness, dysarthric Restrictions Weight Bearing Restrictions: No   Pain: no pain reported during session     Therapy/Group: Individual Therapy  Barron Schmid 06/12/2021, 3:38 PM

## 2021-06-12 NOTE — Progress Notes (Signed)
PROGRESS NOTE   Subjective/Complaints: Complains of dry mouth Milk of magnesia makes his stomach rumble but does not help him have a bowel movement- he would like it discontinued  ROS: Denies CP, SOB, N/V/D, +dry mouth  Objective:   No results found. No results for input(s): WBC, HGB, HCT, PLT in the last 72 hours.  No results for input(s): NA, K, CL, CO2, GLUCOSE, BUN, CREATININE, CALCIUM in the last 72 hours.   Intake/Output Summary (Last 24 hours) at 06/12/2021 1321 Last data filed at 06/12/2021 0840 Gross per 24 hour  Intake 240 ml  Output --  Net 240 ml        Physical Exam: Vital Signs Blood pressure (!) 141/81, pulse 79, temperature 97.7 F (36.5 C), temperature source Oral, resp. rate 18, height 5\' 9"  (1.753 m), weight 91.5 kg, SpO2 98 %. Gen: no distress, normal appearing, BMI 29.79  HEENT: oral mucosa pink and moist, NCAT Cardio: Reg rate Chest: normal effort, normal rate of breathing Abd: soft, non-distended Ext: no edema Psych: pleasant, normal affect Skin: intact Neurological: Alert Dysarthria LUE clumsiness.  Right side strength 5/5, left side 4/5. Sensation intact bilaterally.   Assessment/Plan: 1. Functional deficits which require 3+ hours per day of interdisciplinary therapy in a comprehensive inpatient rehab setting. Physiatrist is providing close team supervision and 24 hour management of active medical problems listed below. Physiatrist and rehab team continue to assess barriers to discharge/monitor patient progress toward functional and medical goals  Care Tool:  Bathing    Body parts bathed by patient: Left arm, Chest, Abdomen, Front perineal area, Buttocks, Left upper leg, Right upper leg, Left lower leg, Right lower leg, Face, Right arm   Body parts bathed by helper: Right arm     Bathing assist Assist Level: Minimal Assistance - Patient > 75%     Upper Body  Dressing/Undressing Upper body dressing   What is the patient wearing?: Pull over shirt    Upper body assist Assist Level: Set up assist    Lower Body Dressing/Undressing Lower body dressing      What is the patient wearing?: Pants, Underwear/pull up     Lower body assist Assist for lower body dressing: Contact Guard/Touching assist     Toileting Toileting    Toileting assist Assist for toileting: Minimal Assistance - Patient > 75%     Transfers Chair/bed transfer  Transfers assist     Chair/bed transfer assist level: Contact Guard/Touching assist     Locomotion Ambulation   Ambulation assist      Assist level: Contact Guard/Touching assist Assistive device: No Device Max distance: 100   Walk 10 feet activity   Assist     Assist level: Contact Guard/Touching assist Assistive device: No Device   Walk 50 feet activity   Assist    Assist level: Contact Guard/Touching assist Assistive device: No Device    Walk 150 feet activity   Assist    Assist level: Minimal Assistance - Patient > 75% Assistive device: No Device    Walk 10 feet on uneven surface  activity   Assist     Assist level: Minimal Assistance - Patient > 75%  Wheelchair     Assist Is the patient using a wheelchair?: Yes Type of Wheelchair: Manual    Wheelchair assist level: Minimal Assistance - Patient > 75% Max wheelchair distance: 55 ft    Wheelchair 50 feet with 2 turns activity    Assist        Assist Level: Minimal Assistance - Patient > 75%   Wheelchair 150 feet activity     Assist      Assist Level: Total Assistance - Patient < 25%   Blood pressure (!) 141/81, pulse 79, temperature 97.7 F (36.5 C), temperature source Oral, resp. rate 18, height 5\' 9"  (1.753 m), weight 91.5 kg, SpO2 98 %.    Medical Problem List and Plan: 1. Functional deficits secondary to right CR infarct likely due to small vessel disease             Continue  CIR 2.  Antithrombotics: -DVT/anticoagulation:  Pharmaceutical: Heparin             -antiplatelet therapy: Plavix and aspirin 81 mg for 3 weeks then aspirin alone 3. Pain Management: Tylenol 4. Mood: LCSW to evaluate and provide emotional support             -antipsychotic agents: n/a 5. Neuropsych: This patient is capable of making decisions on his own behalf. 6. Skin/Wound Care: Routine skin care checks 7. Fluids/Electrolytes/Nutrition: routine Is and Os 8. Hypertension: continue atenolol,doxazosin             --recommended avoiding chlorthalidone secondary to hyponatremia             -- Lisinopril held  Controlled on 2/25 9: Hyperlipidemia: refusing meds due to side effects. Provided list of foods and supplements that can help lower LDL.  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 Overweight: provide dietary education.  12: Constipation: scheduled senna and PRNs. Discontinue milk of magnesia 13: Gout: No current flare.  Allopurinol held, discussed avoiding soda products/added sugar.  14. BPH: Increase Flomax to 0.8mg  HS. Monitor for loose stools as he had this reaction to HCTZ.  15. Hypomagnesemia: discussed with him that his magnesium levels are low. Increase magnesium gluconate to 500 mg HS 16.  Hyponatremia Sodium 133 on 2/22, continue to monitor 17. Dry mouth: Biotene ordered.     LOS: 6 days A FACE TO FACE EVALUATION WAS PERFORMED  [name] [name] 06/12/2021, 1:21 PM

## 2021-06-13 ENCOUNTER — Inpatient Hospital Stay (HOSPITAL_COMMUNITY): Payer: Medicare HMO

## 2021-06-13 DIAGNOSIS — I639 Cerebral infarction, unspecified: Secondary | ICD-10-CM | POA: Diagnosis not present

## 2021-06-13 LAB — BASIC METABOLIC PANEL
Anion gap: 13 (ref 5–15)
BUN: 46 mg/dL — ABNORMAL HIGH (ref 8–23)
CO2: 22 mmol/L (ref 22–32)
Calcium: 9.7 mg/dL (ref 8.9–10.3)
Chloride: 103 mmol/L (ref 98–111)
Creatinine, Ser: 2.01 mg/dL — ABNORMAL HIGH (ref 0.61–1.24)
GFR, Estimated: 33 mL/min — ABNORMAL LOW (ref >60)
Glucose, Bld: 116 mg/dL — ABNORMAL HIGH (ref 70–99)
Potassium: 4.2 mmol/L (ref 3.5–5.1)
Sodium: 138 mmol/L (ref 135–145)

## 2021-06-13 LAB — CBC
HCT: 38.4 % — ABNORMAL LOW (ref 39.0–52.0)
Hemoglobin: 13.5 g/dL (ref 13.0–17.0)
MCH: 33.8 pg (ref 26.0–34.0)
MCHC: 35.2 g/dL (ref 30.0–36.0)
MCV: 96.2 fL (ref 80.0–100.0)
Platelets: 193 10*3/uL (ref 150–400)
RBC: 3.99 MIL/uL — ABNORMAL LOW (ref 4.22–5.81)
RDW: 14.1 % (ref 11.5–15.5)
WBC: 6.2 10*3/uL (ref 4.0–10.5)
nRBC: 0 % (ref 0.0–0.2)

## 2021-06-13 NOTE — Progress Notes (Signed)
PROGRESS NOTE   Subjective/Complaints: No new complaints today Dry mouth improved since he has been able to drink thins Gets emotional as thought he would die from this stroke  ROS: Denies CP, SOB, N/V/D, +dry mouth, +dysarthria  Objective:   DG Swallowing Func-Speech Pathology  Result Date: 06/13/2021 Table formatting from the original result was not included. Objective Swallowing Evaluation: Type of Study: Bedside Swallow Evaluation  Patient Details Name: Allen Torres MRN: [id number] Date of Birth: 08-Dec-1940 Today's Date: 06/13/2021 Past Medical History: Past Medical History: Diagnosis Date  Hypertension   Renal disorder   Patient states, "chronic kidney disease" Past Surgical History: Past Surgical History: Procedure Laterality Date  BIOPSY  03/16/2020  Procedure: BIOPSY;  Surgeon: Yetta Flock, MD;  Location: [facility];  Service: Gastroenterology;;  CHOLECYSTECTOMY N/A 03/18/2020  Procedure: LAPAROSCOPIC CHOLECYSTECTOMY;  Surgeon: Johnathan Hausen, MD;  Location: [facility];  Service: General;  Laterality: N/A;  ESOPHAGOGASTRODUODENOSCOPY N/A 03/16/2020  Procedure: ESOPHAGOGASTRODUODENOSCOPY (EGD);  Surgeon: Yetta Flock, MD;  Location: [facility];  Service: Gastroenterology;  Laterality: N/A;  facial cystectomy   HPI: See H&P  Subjective: emotional  Recommendations for follow up therapy are one component of a multi-disciplinary discharge planning process, led by the attending physician.  Recommendations may be updated based on patient status, additional functional criteria and insurance authorization. Assessment / Plan / Recommendation Clinical Impressions 06/13/2021 Clinical Impression Patient demonstrates a mild oropharyngeal dysphagia. Oral phase is characterized by prolonged mastication with oral residue with solid textures and decreased bolus cohesion of thin liquids when taking large, sequential sips via straw.  Pharyngeal  phase is characterized by inconsistent timing of swallow initiation resulting in one episode of aspiration with mixed consistencies when utilizing a straw. Aspiration dropped below the cords during the swallow but remained on the cords after that swallow was complete. Aspirated were cleared with a cued throat clear. No further aspiration observed with mixed consistencies when straw was removed. Recommend patient continue current diet of Dys. 2 textures but upgrade to thin liquids via cup. SLP Visit Diagnosis Dysphagia, oropharyngeal phase (R13.12) Attention and concentration deficit following -- Frontal lobe and executive function deficit following -- Impact on safety and function Mild aspiration risk   Treatment Recommendations 06/13/2021 Treatment Recommendations Therapy as outlined in treatment plan below   Prognosis 06/13/2021 Prognosis for Safe Diet Advancement Good Barriers to Reach Goals -- Barriers/Prognosis Comment -- Diet Recommendations 06/13/2021 SLP Diet Recommendations Dysphagia 2 (Fine chop) solids;Thin liquid Liquid Administration via Cup;No straw Medication Administration Crushed with puree Compensations Slow rate;Small sips/bites;Lingual sweep for clearance of pocketing;Minimize environmental distractions;Follow solids with liquid;Monitor for anterior loss Postural Changes Seated upright at 90 degrees   Other Recommendations 06/13/2021 Recommended Consults -- Oral Care Recommendations Oral care BID Other Recommendations -- Follow Up Recommendations Outpatient SLP Assistance recommended at discharge Frequent or constant Supervision/Assistance Functional Status Assessment Patient has had a recent decline in their functional status and demonstrates the ability to make significant improvements in function in a reasonable and predictable amount of time. Frequency and Duration  06/13/2021 Speech Therapy Frequency (ACUTE ONLY) min 2x/week Treatment Duration 1 week   Oral Phase 06/13/2021 Oral Phase Impaired  Oral - Pudding  Teaspoon -- Oral - Pudding Cup -- Oral - Honey Teaspoon -- Oral - Honey Cup -- Oral - Nectar Teaspoon -- Oral - Nectar Cup NT Oral - Nectar Straw -- Oral - Thin Teaspoon WFL Oral - Thin Cup WFL Oral - Thin Straw Decreased bolus cohesion Oral - Puree WFL Oral - Mech Soft Impaired mastication;Delayed oral transit Oral - Regular NT Oral - Multi-Consistency Impaired mastication;Decreased bolus cohesion;Piecemeal swallowing Oral - Pill -- Oral Phase - Comment --  Pharyngeal Phase 06/13/2021 Pharyngeal Phase Impaired Pharyngeal- Pudding Teaspoon -- Pharyngeal -- Pharyngeal- Pudding Cup -- Pharyngeal -- Pharyngeal- Honey Teaspoon -- Pharyngeal -- Pharyngeal- Honey Cup -- Pharyngeal -- Pharyngeal- Nectar Teaspoon -- Pharyngeal -- Pharyngeal- Nectar Cup NT Pharyngeal -- Pharyngeal- Nectar Straw NT Pharyngeal -- Pharyngeal- Thin Teaspoon WFL Pharyngeal -- Pharyngeal- Thin Cup [facility] Pharyngeal Material does not enter airway Pharyngeal- Thin Straw Delayed swallow initiation-pyriform sinuses;Penetration/Aspiration during swallow Pharyngeal Material does not enter airway;Material enters airway, passes BELOW cords then ejected out Pharyngeal- Puree Delayed swallow initiation-vallecula Pharyngeal -- Pharyngeal- Mechanical Soft Delayed swallow initiation-vallecula Pharyngeal -- Pharyngeal- Regular NT Pharyngeal -- Pharyngeal- Multi-consistency Delayed swallow initiation-vallecula Pharyngeal -- Pharyngeal- Pill -- Pharyngeal -- Pharyngeal Comment --  Cervical Esophageal Phase  06/13/2021 Cervical Esophageal Phase WFL Pudding Teaspoon -- Pudding Cup -- Honey Teaspoon -- Honey Cup -- Nectar Teaspoon -- Nectar Cup -- Nectar Straw -- Thin Teaspoon -- Thin Cup -- Thin Straw -- Puree -- Mechanical Soft -- Regular -- Multi-consistency -- Pill -- Cervical Esophageal Comment -- PAYNE, COURTNEY 06/13/2021, 12:05 PM    [name], MA, CCC-SLP                  Recent Labs    06/13/21 0552  WBC 6.2  HGB 13.5  HCT 38.4*  PLT  193    Recent Labs    06/13/21 0552  NA 138  K 4.2  CL 103  CO2 22  GLUCOSE 116*  BUN 46*  CREATININE 2.01*  CALCIUM 9.7     Intake/Output Summary (Last 24 hours) at 06/13/2021 1410 Last data filed at 06/13/2021 1239 Gross per 24 hour  Intake 438 ml  Output --  Net 438 ml        Physical Exam: Vital Signs Blood pressure (!) 152/78, pulse 73, temperature 98.2 F (36.8 C), temperature source Oral, resp. rate 18, height 5\' 9"  (1.753 m), weight 91.5 kg, SpO2 100 %. Gen: no distress, normal appearing, BMI 29.79  HEENT: oral mucosa pink and moist, NCAT Cardio: Reg rate Chest: normal effort, normal rate of breathing Abd: soft, non-distended Ext: no edema Psych: pleasant, normal affect Skin: intact Neurological: Alert Dysarthria LUE clumsiness.  Right side strength 5/5, left side 4/5. Sensation intact bilaterally. Dysphagia   Assessment/Plan: 1. Functional deficits which require 3+ hours per day of interdisciplinary therapy in a comprehensive inpatient rehab setting. Physiatrist is providing close team supervision and 24 hour management of active medical problems listed below. Physiatrist and rehab team continue to assess barriers to discharge/monitor patient progress toward functional and medical goals  Care Tool:  Bathing    Body parts bathed by patient: Left arm, Chest, Abdomen, Front perineal area, Buttocks, Left upper leg, Right upper leg, Left lower leg, Right lower leg, Face, Right arm   Body parts bathed by helper: Right arm     Bathing assist Assist Level: Minimal Assistance - Patient > 75%     Upper Body Dressing/Undressing Upper body dressing   What is the patient wearing?: Pull  over shirt    Upper body assist Assist Level: Set up assist    Lower Body Dressing/Undressing Lower body dressing      What is the patient wearing?: Pants, Underwear/pull up     Lower body assist Assist for lower body dressing: Contact Guard/Touching assist      Toileting Toileting    Toileting assist Assist for toileting: Minimal Assistance - Patient > 75%     Transfers Chair/bed transfer  Transfers assist     Chair/bed transfer assist level: Independent with assistive device Chair/bed transfer assistive device: Walker, Clinical biochemist   Ambulation assist      Assist level: Supervision/Verbal cueing Assistive device: Walker-rolling Max distance: 300'   Walk 10 feet activity   Assist     Assist level: Independent with assistive device Assistive device: Walker-rolling   Walk 50 feet activity   Assist    Assist level: Supervision/Verbal cueing Assistive device: Walker-rolling    Walk 150 feet activity   Assist    Assist level: Supervision/Verbal cueing Assistive device: Walker-rolling    Walk 10 feet on uneven surface  activity   Assist     Assist level: Contact Guard/Touching assist Assistive device: Walker-rolling   Wheelchair     Assist Is the patient using a wheelchair?: No Type of Wheelchair: Manual    Wheelchair assist level: Minimal Assistance - Patient > 75% Max wheelchair distance: 55 ft    Wheelchair 50 feet with 2 turns activity    Assist        Assist Level: Minimal Assistance - Patient > 75%   Wheelchair 150 feet activity     Assist      Assist Level: Total Assistance - Patient < 25%   Blood pressure (!) 152/78, pulse 73, temperature 98.2 F (36.8 C), temperature source Oral, resp. rate 18, height 5\' 9"  (1.753 m), weight 91.5 kg, SpO2 100 %.    Medical Problem List and Plan: 1. 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  Antithrombotics: -DVT/anticoagulation:  Pharmaceutical: Heparin             -antiplatelet therapy: Plavix and aspirin 81 mg for 3 weeks then aspirin alone 3. Pain Management: Tylenol 4. Tearful as thought he would die from this stroke:  LCSW to evaluate and provide emotional support. Chaplain consulted             -antipsychotic agents: n/a 5. Neuropsych: This patient is capable of making decisions on his own behalf. 6. Skin/Wound Care: Routine skin care checks 7. Fluids/Electrolytes/Nutrition: routine Is and Os 8. Hypertension: continue atenolol,doxazosin             --recommended avoiding chlorthalidone secondary to hyponatremia             -- Lisinopril held  Continue magnesium 9: Hyperlipidemia: refusing meds due to side effects. Provided list of foods and supplements that can help lower LDL.  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 Overweight: provide dietary education.  12: Constipation: scheduled senna and PRNs. Discontinue milk of magnesia 13: Gout: No current flare.  Allopurinol held, discussed avoiding soda products/added sugar.  14. BPH: Increase Flomax to 0.8mg  HS. Monitor for loose stools as he had this reaction to HCTZ.  15. Hypomagnesemia: discussed with him that his magnesium levels are low. Increase magnesium gluconate  to 500 mg HS 16.  Hyponatremia Sodium 133 on 2/22, continue to monitor 17. Dry mouth: Biotene ordered.   18. Dysphagia: advance to thins.    LOS: 7 days A FACE TO FACE EVALUATION WAS PERFORMED  Martha Clan P [place] Jeff 06/13/2021, 2:10 PM

## 2021-06-13 NOTE — Progress Notes (Signed)
Occupational Therapy Discharge Summary  Patient Details  Name: Allen Torres MRN: [id number] Date of Birth: July 29, 1940  Today's Date: 06/13/2021 OT Individual Time: 0800-0830 OT Individual Time Calculation (min): 30 min   Today's Date: 06/13/2021 OT Individual Time: 1330-1430 OT Individual Time Calculation (min): 60 min   Session 1:  Pt received in w/c very grouchy- pts description and spent most of session complaining about food and drink protocols put in place because of pt residual dysphagia. OT educated pt on reasoning for protocols as pt is at high risk for aspiration and resultant pneumonia, hwoever pt refuses information stating, "when im at home, im the boss and im going to eat and drink what I want." OT redirected pt to [facility] program provided as well as theraputty exercises for NMR of LUE.   Fine Motor control Activities Playing cards- stacking, shuffling, dealing cards into piles Coins- Picking up coins from the tabletop, stacking coins, putting in bank or vending machine Playing board games with small pieces- scrabble, monopoly etc Playing an instrument- piano, guitar etc Handwriting/traceing/drawing Theraputty exercises- gross grasp, pinch, rolling out a snake, pulling apart, extension   Pt homework before next session to attempt therex following handout and report any questions  Pt left at end of session in w/c with exit alarm on, call light in reach and all needs met  Session 2:  Pt received in bed with son present and no pain. Son heading out and OT gave quick overview of expectations at home and provided supervision level handout for DC with ed on L inattention and pt specific tips. Son appreciative and told it owuld be stored in pt notebook for home ADL: Pt completes ADL at overall supervision Level. Skilled interventions include: cuing mostly for safety awareness as pt is prone to leaving walker to the side as well as cuing to visually attend to L hand when attempting  to reaching for something (soap, grab bar, ADL item etc). Pt impulsive and picks 3 items up from the floor with MIN A 1x d/t minor LOB forward. Pt practices TTB transfer with RW and demo good carry over after OT demo sequence.   Pt left at end of session in bed with exit alarm on, call light in reach and all needs met  Supervision level handout DC instructions for home General mobility- they should always use their RW. They may benefit from a walker tray to transport items from room to room if walking with a walker is recommended by physical therapy. The walker should be kept within reach so they can pull it close to get up and keep with them to back up to any surface they want to sit on. When getting up, they should push up from the surface they are getting up from and reach back when sitting to a new surface, no plopping.  You are their shadow. Especially in the beginning. You, as the helper should be in reach of the patient when mobilizing. You should be either beside or behind them so if they lose their balance you can assist by helping correct at the hips. This is likely closer than you are used to being- be in their personal space. Use a gait belt if that makes you feel more comfortable If you are attempting to get up/transfer and it is not going well, reset. Have them sit back down. Make sure they are close to the edge of the seat, feet are underneath them at hips distance, and they are leaning forward  to stand up. Bathing- they should sit to bathe on a shower chair, especially for washing legs/feet. Sitting will save energy and increase safety. For a tub shower with shower chair: Use the walker to walk up to shower/tub edge and leave it to the side, but close. they can use the wall to steady as they step over or a grab bar. Do your best to dry off the floor prior to getting out of the shower For tub shower with Tub bench: use walker to get to the edge of the tub bench, back up to the edge, reach  back prior to sitting down. Turn to swing legs into tub and scoot across. Reverse to exit the tub. Make sure both legs are out of the tub prior to standing to exit the bathroom Walk in shower: walk up to the shower ledge, turn around and back up to the ledge with the walker. Keep both hands-on walker while they step back one foot at a time Dressing- all should be done from a SEATED level, especially to put underwear and pants over feet.  Toileting- the RW can be walked right over the toilet for standing urination if applicable. If seated toileting is more appropriate, have them walk up to the toilet and keep walker with them as they turn to sit to toilet or BSC. Before they stand to pull up pants past hips they should pull pants/underwear up past their knees to decrease the need to bend forward to the floor. Sometimes this makes people dizzy if incontinence/bathroom accidents are an issue attempt to toilet every 2-3 hours to improve success with toileting and decrease accidents.  Energy conservation principles- Prioritize what needs to be done and what can be moved to another day Plan out their days, weeks, months to spread out taxing (physical or cognitively tiring) activities to not put too much at one time Pace activities- rest before feeling tired and have designated places to rest if they feel tired and need to take a brake Position for success: sit when able to conserve 25% more energy than standing     Patient has met 11 of 11 long term goals due to improved activity tolerance, improved balance, postural control, ability to compensate for deficits, functional use of  LEFT upper and LEFT lower extremity, improved awareness, and improved coordination.  Patient to discharge at overall Supervision level.  Patient's care partner is independent to provide the necessary cognitive assistance at discharge.  Pt has made good progress towards goals at supervision level, however would still benefit from  skilled OT as stated below to work on L attention/coordiantion and strength deficits to improve participation in higher level activities and improve independence/safety  Reasons goals not met: n/a  Recommendation:  Patient will benefit from ongoing skilled OT services in outpatient setting to continue to advance functional skills in the area of BADL, iADL, and Reduce care partner burden.  Equipment: TTB  Reasons for discharge: treatment goals met and discharge from hospital  Patient/family agrees with progress made and goals achieved: Yes  OT Discharge Precautions/Restrictions  Precautions Precautions: Fall Precaution Comments: left side weakness, dysarthric Restrictions Weight Bearing Restrictions: No General   Vital Signs   Pain Pain Assessment Pain Score: 0-No pain ADL ADL Eating: Supervision/safety Where Assessed-Eating: Bed level Grooming: Supervision/safety Where Assessed-Grooming: Sitting at sink Upper Body Bathing: Supervision/safety Where Assessed-Upper Body Bathing: Sitting at sink Lower Body Bathing: Supervision/safety Where Assessed-Lower Body Bathing: Sitting at sink Upper Body Dressing: Supervision/safety Where Assessed-Upper Body  Dressing: Sitting at sink Lower Body Dressing: Supervision/safety Where Assessed-Lower Body Dressing: Sitting at sink, Standing at sink Toileting: Supervision/safety Where Assessed-Toileting: Glass blower/designer: Close supervision Toilet Transfer Method: Counselling psychologist: Energy manager: Unable to assess Tub/Shower Transfer Method: Unable to assess Vision Baseline Vision/History: 1 Wears glasses Patient Visual Report: No change from baseline Vision Assessment?: Yes Eye Alignment: Within Functional Limits Tracking/Visual Pursuits: Decreased smoothness of horizontal tracking;Decreased smoothness of vertical tracking;Requires cues, head turns, or add eye shifts to track Convergence:  Within functional limits Perception  Perception: Impaired Inattention/Neglect: Does not attend to left visual field (mild L inattention) Praxis Praxis: Impaired Praxis Impairment Details: Motor planning Praxis-Other Comments: continues to improve since eval Cognition Overall Cognitive Status: Impaired/Different from baseline Arousal/Alertness: Awake/alert Orientation Level: Oriented X4 Year: 2023 Month: February Day of Week: Correct Attention: Sustained Memory: Appears intact Immediate Memory Recall: Sock;Blue;Bed Memory Recall Sock: Without Cue Memory Recall Blue: With Cue Memory Recall Bed: Without Cue Problem Solving: Appears intact Behaviors: Impulsive Comments: Safety awareness impaired Sensation Sensation Light Touch: Appears Intact Proprioception: Appears Intact Coordination Gross Motor Movements are Fluid and Coordinated: Yes Fine Motor Movements are Fluid and Coordinated: No Motor  Motor Motor: Hemiplegia Motor - Skilled Clinical Observations: L hemi Motor - Discharge Observations: LUE more affected than LLE Mobility  Bed Mobility Bed Mobility: Rolling Right;Supine to Sit;Sit to Supine Rolling Right: Independent Rolling Left: Independent Supine to Sit: Independent Sit to Supine: Independent Transfers Sit to Stand: Independent with assistive device Stand to Sit: Independent with assistive device  Trunk/Postural Assessment  Cervical Assessment Cervical Assessment: Within Functional Limits Thoracic Assessment Thoracic Assessment: Exceptions to [facility] (slightly kyphotic) Lumbar Assessment Lumbar Assessment: Exceptions to [facility] (posterior pelvic tilt and decreased pelvic flexibility)  Balance Balance Balance Assessed: Yes Static Sitting Balance Static Sitting - Level of Assistance: 7: Independent Dynamic Sitting Balance Dynamic Sitting - Level of Assistance: 7: Independent Static Standing Balance Static Standing - Level of Assistance: 6: Modified  independent (Device/Increase time) (with RW) Dynamic Standing Balance Dynamic Standing - Level of Assistance: 6: Modified independent (Device/Increase time) (with RW) Extremity/Trunk Assessment RUE Assessment RUE Assessment: Within Functional Limits LUE Assessment LUE Assessment: Exceptions to [facility] Active Range of Motion (AROM) Comments: 3/5 MMT, uncoordinated LUE Body System: Neuro Brunstrum levels for arm and hand: Arm;Hand Brunstrum level for arm: Stage V Relative Independence from Synergy Brunstrum level for hand: Stage V Independence from basic synergies   Tonny Branch 06/13/2021, 12:27 PM

## 2021-06-13 NOTE — Progress Notes (Signed)
Physical Therapy Discharge Summary  Patient Details  Name: Allen Torres MRN: [id number] Date of Birth: 1941-03-01  Today's Date: 06/13/2021 PT Individual Time: 0957-1100 PT Individual Time Calculation (min): 63 min  Session focused on grad day activities and finalizing d/c education and assessments. Pt very pleased with results of MBS (had just returned shortly before this session began). Denies concerns in regards to d/c planned for tomorrow. Pt does report he did not sleep well last night and is tired but agreeable to session. Pt performed basic transfers throughout session from various surfaces at overall mod I level with RW. Occasional cues for safe use of RW placement in new environment/scenarios. Simulated car transfer with supervision for safe placement of RW and cues for technique - pt self selected to step into car and PT suggested in future safest option would be to sit down first, but pt able to complete the transfer safely. Up/down ramp with RW and over mulched surface with overall close supervision to CGA and cues for safe placement for RW during turning on mulch. Performed curb step in community setting with RW with CGA for balance and cues for placement and positioning of RW on and off step for safety. Bed mobility on flat surface independent. Pt instructed in HEP and reviewed way to incorporate activity at home including walking program (pt reports liking to walk outdoors and excited to get back to this), community resources such as Chief of Staff or other programs, and overall return to activity. Issued handout with HEP and reviewed the supine exercises (bridges and lateral trunk rotation as these were unfamiliar to patient).  Access Code: [email or web address] URL: [email or web address] Date: 06/13/2021 Prepared by: Bryson Ha  Exercises Supine Bridge - 1 x daily - 7 x weekly - 3 sets - 10 reps Supine Lower Trunk Rotation - 1 x daily - 7 x weekly - 3 sets - 10 reps Standing Hip  Abduction with Counter Support - 1 x daily - 7 x weekly - 3 sets - 10 reps Standing Hamstring Curl with Chair Support - 1 x daily - 7 x weekly - 3 sets - 10 reps Standing Heel Raise with Chair Support - 1 x daily - 7 x weekly - 3 sets - 10 reps Standing Ankle Dorsiflexion with Chair Support - 1 x daily - 7 x weekly - 3 sets - 10 reps Mini Squat with Counter Support - 1 x daily - 7 x weekly - 3 sets - 10 reps Foot Roller Plantar Massage - 1 x daily - 7 x weekly - 3 sets - 10 reps  Pt also reports having tightness or issues in plantar fascia and at ball of foot. Recommended trialling a foot roller ball (provided handout of information of where it could be purchased and what it looks like) and included an exercise on HEP as well. Pt excited about the options given.  Pt performed gait all over unit > 300' with RW at supervision level and mod I for shorter distances with occasional cues for more upright posture. Pt aware of occasional decreased  L foot clearance, especially with shoes he was wearing today (and noted on the stairs).  Stair negotiation training for community access and functional strengthening with a combination of single R rail and bilateral rails and self selected step to and reciprocal pattern x 12 steps x 2 reps total. Pt able to verbalize decreased L foot clearance and occasional misplacement without cues from PT and self correct. Discussed importance of wearing good fitting shoes (  had bedroom shoes on vs his sneakers today) especially for stairs. Pt fully aware and in agreement.   Handoff to SLP at end of session.    Patient has met 10 of 10 long term goals due to improved activity tolerance, improved balance, improved postural control, ability to compensate for deficits, functional use of  left upper extremity and left lower extremity, improved awareness, improved attention, and improved coordination.  Patient to discharge at an ambulatory level Supervision.   Patient's care partner  is independent to provide the necessary  supervision  assistance at discharge. Formal family education was not completed. Recommending overall supervision due to high fall risk.  Reasons goals not met: n/a - all goals met at this time  Recommendation:  Patient will benefit from ongoing skilled PT services in home health setting to continue to advance safe functional mobility, address ongoing impairments in balance, functional mobility, gait, endurance, cognition, safety awareness, strength, and minimize fall risk.  Equipment: RW  Reasons for discharge: treatment goals met and discharge from hospital  Patient/family agrees with progress made and goals achieved: Yes  PT Discharge Precautions/Restrictions Precautions Precautions: Fall Restrictions Weight Bearing Restrictions: No  Pain  No complaints of pain. Pain Interference Pain Interference Pain Effect on Sleep: 0. Does not apply - I have not had any pain or hurting in the past 5 days Pain Interference with Therapy Activities: 0. Does not apply - I have not received rehabilitationtherapy in the past 5 days Pain Interference with Day-to-Day Activities: 1. Rarely or not at all Vision/Perception  Perception Inattention/Neglect:  (mild L inattention)  Cognition Overall Cognitive Status: Impaired/Different from baseline Arousal/Alertness: Awake/alert Attention: Sustained Memory: Appears intact Problem Solving: Appears intact Behaviors: Impulsive Comments: Safety awareness impaired Sensation Sensation Light Touch: Appears Intact (BLE) Proprioception: Appears Intact (BLE) Coordination Gross Motor Movements are Fluid and Coordinated: Yes (LEs\) Motor  Motor Motor: Hemiplegia Motor - Discharge Observations: LUE more affected than LLE  Mobility Bed Mobility Bed Mobility: Rolling Right;Supine to Sit;Sit to Supine Rolling Right: Independent Rolling Left: Independent Supine to Sit: Independent Sit to Supine:  Independent Transfers Transfers: Sit to [employer] Transfers Sit to Stand: Independent with assistive device Stand to Sit: Independent with assistive device Stand Pivot Transfers: Independent with assistive device Stand Pivot Transfer Details: Verbal cues for safe use of DME/AE Transfer (Assistive device): Rolling walker Locomotion  Gait Ambulation: Yes Gait Assistance: Supervision/Verbal cueing Gait Distance (Feet): 600 Feet Assistive device: Rolling walker Gait Assistance Details: Verbal cues for safe use of DME/AE;Verbal cues for gait pattern Gait Assistance Details: CGA without AD Gait Gait: Yes Gait Pattern: Impaired Gait Pattern: Decreased dorsiflexion - left;Poor foot clearance - left (intermittent decreasded L foot clearance) Stairs / Additional Locomotion Stairs: Yes Stairs Assistance: Supervision/Verbal cueing;Contact Guard/Touching assist Stair Management Technique: One rail Right;Two rails;Alternating pattern;Step to pattern Number of Stairs: 12 Height of Stairs: 6 Ramp: Supervision/Verbal cueing Curb: Contact Guard/Touching assist Pick up small object from the floor assist level: Supervision/Verbal cueing Wheelchair Mobility Wheelchair Mobility: No (pt ambulatory)  Trunk/Postural Assessment  Cervical Assessment Cervical Assessment: Within Functional Limits Thoracic Assessment Thoracic Assessment: Exceptions to [facility] (slightly kyphotic) Lumbar Assessment Lumbar Assessment: Exceptions to [facility] (posterior pelvic tilt and decreased pelvic flexibility)  Balance Balance Balance Assessed: Yes Static Sitting Balance Static Sitting - Level of Assistance: 7: Independent Dynamic Sitting Balance Dynamic Sitting - Level of Assistance: 7: Independent Static Standing Balance Static Standing - Level of Assistance: 6: Modified independent (Device/Increase time) (with RW) Dynamic Standing Balance Dynamic Standing -  Level of Assistance: 6: Modified independent  (Device/Increase time) (with RW) Extremity Assessment      RLE Assessment RLE Assessment: Within Functional Limits LLE Assessment LLE Assessment: Exceptions to [facility] Strength Comments: grossly 4+/5; 4/5 hamstrings (seated)    Canary Brim Ivory Broad, PT, DPT, CBIS  06/13/2021, 1:19 PM

## 2021-06-13 NOTE — Progress Notes (Signed)
Speech Language Pathology Discharge Summary  Patient Details  Name: Allen Torres MRN: [id number] Date of Birth: 1941/01/19   Patient has met 6 of 6 long term goals.  Patient to discharge at overall Supervision level.   Reasons goals not met: N/A   Clinical Impression/Discharge Summary: Patient has made functional gains and has met 6 of 6 LTGs this admission. Currently, patient is consuming Dys. 2 textures with thin liquids with minimal overt s/s of aspiration and required Min verbal cues for use of swallowing compensatory strategies. Patient is ~90-100% intelligible at the conversation level with supervision level verbal cues needed for use of speech intelligibility strategies. Supervision level verbal cues are also needed for selective attention due to verbosity which SLP suspects is baseline. Patient and family education is complete and patient will discharge home with 24 hour supervision. Patient would benefit from f/u SLP services to maximize his swallowing functioning and speech intelligibility in order to reduce caregiver burden.   Care Partner:  Caregiver Able to Provide Assistance: Yes  Type of Caregiver Assistance: Physical;Cognitive  Recommendation:  iOutpatient SLP;24 hour supervision/assistance  Rationale for SLP Follow Up: Reduce caregiver burden;Maximize functional communication;Maximize swallowing safety   Equipment: N/A   Reasons for discharge: Discharged from hospital;Treatment goals met   Patient/Family Agrees with Progress Made and Goals Achieved: Yes    Dennie Vecchio, [place] 06/13/2021, 1:19 PM

## 2021-06-13 NOTE — Progress Notes (Addendum)
Patient ID: Allen Torres, male   DOB: 1940/05/06, 81 y.o.   MRN: [id number]  Preparing for discharge tomorrow, pt has his rolling walker and home health set up via [facility]. Pt feels ready to go home and family will be providing 24/7 care at home. See in am for any last minute questions.

## 2021-06-13 NOTE — Progress Notes (Signed)
Speech Language Pathology Daily Session Note  Patient Details  Name: Allen Torres MRN: [id number] Date of Birth: Mar 24, 1941  Today's Date: 06/13/2021 SLP Individual Time: 1100-1130 SLP Individual Time Calculation (min): 30 min  Short Term Goals: Week 1: SLP Short Term Goal 1 (Week 1): STGs=LTGs due to ELOS  Skilled Therapeutic Interventions: Pt seen this date for skilled ST intervention targeting dysphagia management. Pt encountered sitting on the EOB and finishing up with PT. Agreeable to ST intervention.   SLP facilitated today's session by providing verbal and written education re: rationale and importance of adhering to aspiration precautions (small bites/sips, alternating bites/sips, not utilizing straws, limiting distractions, and check for pocketing on L side). Additionally, provided handout re: acceptable foods for Dysphagia 2 diet, as well as foods to avoid. Pt was an active participant in today's education and provided relevant examples of how he will implement aforementioned recommendations at home. Pt's daughter also provided education via phone.   At the end of the session, pt left lying in bed (HOB >30 degrees) with bed alarm on. Call bell and personal possessions all within reach.   Pain Pt denies pain during today's ST session.  Therapy/Group: Individual Therapy  Avionna Bower A Shamell Hittle 06/13/2021, 12:22 PM

## 2021-06-13 NOTE — Discharge Instructions (Addendum)
Inpatient Rehab Discharge Instructions  Allen Torres Discharge date and time: 06/14/2021  Activities/Precautions/ Functional Status: Activity: no lifting, driving, or strenuous exercise for until cleared by MD  Diet: diabetic diet, heart healthy  Wound Care: none needed  Functional status:  ___ No restrictions     ___ Walk up steps independently ___ 24/7 supervision/assistance   ___ Walk up steps with assistance ___ Intermittent supervision/assistance  ___ Bathe/dress independently ___ Walk with walker     ___ Bathe/dress with assistance ___ Walk Independently    ___ Shower independently ___ Walk with assistance    __x_ Shower with assistance __x_ No alcohol     ___ Return to work/school ________  Special Instructions:  No driving, alcohol consumption or tobacco use.   COMMUNITY REFERRALS UPON DISCHARGE:    Home Health:   PT  OT  [facility]                Agency:[facility]    Phone:[phone]   Medical Equipment/Items Ordered:ROLLING [name]                                                 Agency/Supplier:[facility]  [phone]    STROKE/TIA DISCHARGE INSTRUCTIONS SMOKING Cigarette smoking nearly doubles your risk of having a stroke & is the single most alterable risk factor  If you smoke or have smoked in the last 12 months, you are advised to quit smoking for your health. Most of the excess cardiovascular risk related to smoking disappears within a year of stopping. Ask you doctor about anti-smoking medications [place] Quit Line: 1-800-QUIT NOW Free Smoking Cessation Classes [phone]  CHOLESTEROL Know your levels; limit fat & cholesterol in your diet  Lipid Panel     Component Value Date/Time   CHOL 218 (H) 06/02/2021 0211   TRIG 287 (H) 06/02/2021 0211   HDL 29 (L) 06/02/2021 0211   CHOLHDL 7.5 06/02/2021 0211   VLDL 57 (H) 06/02/2021 0211   LDLCALC 132 (H) 06/02/2021 0211     Many patients benefit from treatment even if their cholesterol is at goal. Goal:  Total Cholesterol (CHOL) less than 160 Goal:  Triglycerides (TRIG) less than 150 Goal:  HDL greater than 40 Goal:  LDL (LDLCALC) less than 100   BLOOD PRESSURE American Stroke Association blood pressure target is less that 120/80 mm/Hg  Your discharge blood pressure is:  BP: (!) 152/78 Monitor your blood pressure Limit your salt and alcohol intake Many individuals will require more than one medication for high blood pressure  DIABETES (A1c is a blood sugar average for last 3 months) Goal HGBA1c is under 7% (HBGA1c is blood sugar average for last 3 months)  Diabetes: Diagnosis of diabetes:  Your A1c:6.1 %    Lab Results  Component Value Date   HGBA1C 6.1 (H) 06/02/2021    Your HGBA1c can be lowered with medications, healthy diet, and exercise. Check your blood sugar as directed by your physician Call your physician if you experience unexplained or low blood sugars.  PHYSICAL ACTIVITY/REHABILITATION Goal is 30 minutes at least 4 days per week  Activity: Increase activity slowly, Therapies: Physical Therapy: Home Health, Occupational Therapy: Home Health, and Speech Therapy: Home Health Return to work: n/a Activity decreases your risk of heart attack and stroke and makes your heart stronger.  It helps control your weight and  blood pressure; helps you relax and can improve your mood. Participate in a regular exercise program. Talk with your doctor about the best form of exercise for you (dancing, walking, swimming, cycling).  DIET/WEIGHT Goal is to maintain a healthy weight  Your discharge diet is:  Diet Order             DIET DYS 2 Room service appropriate? No; Fluid consistency: Thin  Diet effective now                  thin liquids Your height is:  Height: 5\' 9"  (175.3 cm) Your current weight is: Weight: 91.5 kg Your Body Mass Index (BMI) is:  BMI (Calculated): 29.78 Following the type of diet specifically designed for you will help prevent another stroke. Your goal weight  range is:   Your goal Body Mass Index (BMI) is 19-24. Healthy food habits can help reduce 3 risk factors for stroke:  High cholesterol, hypertension, and excess weight.  RESOURCES Stroke/Support Group:  Call [phone]   STROKE EDUCATION PROVIDED/REVIEWED AND GIVEN TO PATIENT Stroke warning signs and symptoms How to activate emergency medical system (call 911). Medications prescribed at discharge. Need for follow-up after discharge. Personal risk factors for stroke. Pneumonia vaccine given: No Flu vaccine given: No My questions have been answered, the writing is legible, and I understand these instructions.  I will adhere to these goals & educational materials that have been provided to me after my discharge from the hospital.      My questions have been answered and I understand these instructions. I will adhere to these goals and the provided educational materials after my discharge from the hospital.  Patient/Caregiver Signature _______________________________ Date __________  Clinician Signature _______________________________________ Date __________  Please bring this form and your medication list with you to all your follow-up doctor's appointments.

## 2021-06-13 NOTE — Progress Notes (Addendum)
Modified Barium Swallow Progress Note  Patient Details  Name: TILTON MARSALIS MRN: [id number] Date of Birth: [birth year]  Today's Date: 06/13/2021  Modified Barium Swallow completed.  Full report located under Chart Review in the Imaging Section.  Brief recommendations include the following:  Clinical Impression  Patient demonstrates a moderate oral dysphagia and a mild pharyngeal dysphagia. Oral phase is characterized by prolonged mastication with oral residue with solid textures and decreased bolus cohesion of thin liquids when taking large, sequential sips via straw.  Pharyngeal phase is characterized by inconsistent timing of swallow initiation resulting in one episode of aspiration with mixed consistencies when utilizing a straw. Aspiration dropped below the cords during the swallow but remained on the cords after that swallow was complete. Aspirated were cleared with a cued throat clear. No further aspiration observed with mixed consistencies when straw was removed. Recommend patient continue current diet of Dys. 2 textures but upgrade to thin liquids via cup.   Swallow Evaluation Recommendations       SLP Diet Recommendations: Dysphagia 2 (Fine chop) solids;Thin liquid   Liquid Administration via: Cup;No straw   Medication Administration: Crushed with puree   Supervision: Patient able to self feed;Full supervision/cueing for compensatory strategies   Compensations: Slow rate;Small sips/bites;Lingual sweep for clearance of pocketing;Minimize environmental distractions;Follow solids with liquid;Monitor for anterior loss   Postural Changes: Seated upright at 90 degrees   Oral Care Recommendations: Oral care BID        Isella Slatten 06/13/2021,11:25 AM

## 2021-06-14 DIAGNOSIS — I639 Cerebral infarction, unspecified: Secondary | ICD-10-CM | POA: Diagnosis not present

## 2021-06-14 MED ORDER — ATENOLOL 25 MG PO TABS: 25.0000 mg | ORAL_TABLET | Freq: Every day | ORAL | 0 refills | Status: DC

## 2021-06-14 MED ORDER — DOXAZOSIN MESYLATE 1 MG PO TABS: 1.0000 mg | ORAL_TABLET | Freq: Two times a day (BID) | ORAL | 0 refills | Status: DC

## 2021-06-14 MED ORDER — CLOPIDOGREL BISULFATE 75 MG PO TABS: 75.0000 mg | ORAL_TABLET | Freq: Every day | ORAL | 0 refills | Status: AC

## 2021-06-14 MED ORDER — MAGNESIUM GLUCONATE 500 MG PO TABS: 500.0000 mg | ORAL_TABLET | Freq: Every day | ORAL | 0 refills | Status: DC

## 2021-06-14 MED ORDER — TAMSULOSIN HCL 0.4 MG PO CAPS: 0.8000 mg | ORAL_CAPSULE | Freq: Every day | ORAL | 0 refills | Status: DC

## 2021-06-14 MED ORDER — GUAIFENESIN-DM 100-10 MG/5ML PO SYRP: 5.0000 mL | ORAL_SOLUTION | Freq: Four times a day (QID) | ORAL | 0 refills | Status: AC | PRN

## 2021-06-14 MED ORDER — SENNA 8.6 MG PO TABS: 1.0000 | ORAL_TABLET | Freq: Two times a day (BID) | ORAL | 0 refills | Status: AC

## 2021-06-14 MED ORDER — BIOTENE DRY MOUTH MT LIQD: 15.0000 mL | OROMUCOSAL | 0 refills | Status: DC | PRN

## 2021-06-14 MED ORDER — ACETAMINOPHEN 325 MG PO TABS: 325.0000 mg | ORAL_TABLET | ORAL | Status: AC | PRN

## 2021-06-14 MED ORDER — ASPIRIN 81 MG PO TBEC: 81.0000 mg | DELAYED_RELEASE_TABLET | Freq: Every day | ORAL | 0 refills | Status: AC

## 2021-06-14 NOTE — Progress Notes (Signed)
Inpatient Rehabilitation Care Coordinator ?Discharge Note  ? ?Patient Details  ?Name: Allen Torres ?MRN: [id number] ?Date of Birth: 10-Oct-1940 ? ? ?Discharge location: HOME WITH CHILDREN PROVIDING 24/7 SUPERVISION ? ?Length of Stay:  8 DAYS ? ?Discharge activity level: SUPERVISION LEVEL ? ?Home/community participation: ACTIVE ? ?Patient response ZG:YFVCBS Literacy - How often do you need to have someone help you when you read instructions, pamphlets, or other written material from your doctor or pharmacy?: Often ? ?Patient response WH:QPRFFM Isolation - How often do you feel lonely or isolated from those around you?: Never ? ?Services provided included: MD, RD, PT, OT, SLP, RN, CM, Pharmacy, SW ? ?Financial Services:  ?Field seismologist Utilized: Private Insurance ?[employer] MEDICARE ? ?Choices offered to/list presented to: PT AND SON ? ?Follow-up services arranged:  ?Home Health, Patient/Family has no preference for HH/DME agencies, DME ?Home Health Agency: [facility]  PT, OT,SP  ?  ?DME : [facility]  ROLLING WALKER SAID GET TUB SEAT ON OWN ?  ? ?Patient response to transportation need: ?Is the patient able to respond to transportation needs?: Yes ?In the past 12 months, has lack of transportation kept you from medical appointments or from getting medications?: No ?In the past 12 months, has lack of transportation kept you from meetings, work, or from getting things needed for daily living?: No ? ? ? ?Comments (or additional information):PT DID WELL AND SON HERE OFTEN AND OBSERVED IN THERAPIES. ALL FEEL PREPARED FOR DC HOME TODAY ? ?Patient/Family verbalized understanding of follow-up arrangements:  Yes ? ?Individual responsible for coordination of the follow-up plan: JILDA-DAUGHTER [phone] ? ?Confirmed correct DME delivered: Lucy Chris 06/14/2021   ? ?Lucy Chris ?

## 2021-06-14 NOTE — Progress Notes (Signed)
PROGRESS NOTE   Subjective/Complaints: No new complaints today Stable for d/c home with children today- he is very appreciative of his children and does not like being a burden on them  ROS: DeniesCP, SOB, N/V/D, +dry mouth, +dysarthria  Objective:   DG Swallowing Func-Speech Pathology  Result Date: 06/13/2021 Table formatting from the original result was not included. Objective Swallowing Evaluation: Type of Study: Bedside Swallow Evaluation  Patient Details Name: Allen Torres MRN: [id number] Date of Birth: 1940/11/05 Today's Date: 06/13/2021 Past Medical History: Past Medical History: Diagnosis Date  Hypertension   Renal disorder   Patient states, "chronic kidney disease" Past Surgical History: Past Surgical History: Procedure Laterality Date  BIOPSY  03/16/2020  Procedure: BIOPSY;  Surgeon: Yetta Flock, MD;  Location: [facility];  Service: Gastroenterology;;  CHOLECYSTECTOMY N/A 03/18/2020  Procedure: LAPAROSCOPIC CHOLECYSTECTOMY;  Surgeon: Johnathan Hausen, MD;  Location: [facility];  Service: General;  Laterality: N/A;  ESOPHAGOGASTRODUODENOSCOPY N/A 03/16/2020  Procedure: ESOPHAGOGASTRODUODENOSCOPY (EGD);  Surgeon: Yetta Flock, MD;  Location: [facility];  Service: Gastroenterology;  Laterality: N/A;  facial cystectomy   HPI: See H&P  Subjective: emotional  Recommendations for follow up therapy are one component of a multi-disciplinary discharge planning process, led by the attending physician.  Recommendations may be updated based on patient status, additional functional criteria and insurance authorization. Assessment / Plan / Recommendation Clinical Impressions 06/13/2021 Clinical Impression Patient demonstrates a mild oropharyngeal dysphagia. Oral phase is characterized by prolonged mastication with oral residue with solid textures and decreased bolus cohesion of thin liquids when taking large, sequential sips via straw.   Pharyngeal phase is characterized by inconsistent timing of swallow initiation resulting in one episode of aspiration with mixed consistencies when utilizing a straw. Aspiration dropped below the cords during the swallow but remained on the cords after that swallow was complete. Aspirated were cleared with a cued throat clear. No further aspiration observed with mixed consistencies when straw was removed. Recommend patient continue current diet of Dys. 2 textures but upgrade to thin liquids via cup. SLP Visit Diagnosis Dysphagia, oropharyngeal phase (R13.12) Attention and concentration deficit following -- Frontal lobe and executive function deficit following -- Impact on safety and function Mild aspiration risk   Treatment Recommendations 06/13/2021 Treatment Recommendations Therapy as outlined in treatment plan below   Prognosis 06/13/2021 Prognosis for Safe Diet Advancement Good Barriers to Reach Goals -- Barriers/Prognosis Comment -- Diet Recommendations 06/13/2021 SLP Diet Recommendations Dysphagia 2 (Fine chop) solids;Thin liquid Liquid Administration via Cup;No straw Medication Administration Crushed with puree Compensations Slow rate;Small sips/bites;Lingual sweep for clearance of pocketing;Minimize environmental distractions;Follow solids with liquid;Monitor for anterior loss Postural Changes Seated upright at 90 degrees   Other Recommendations 06/13/2021 Recommended Consults -- Oral Care Recommendations Oral care BID Other Recommendations -- Follow Up Recommendations Outpatient SLP Assistance recommended at discharge Frequent or constant Supervision/Assistance Functional Status Assessment Patient has had a recent decline in their functional status and demonstrates the ability to make significant improvements in function in a reasonable and predictable amount of time. Frequency and Duration  06/13/2021 Speech Therapy Frequency (ACUTE ONLY) min 2x/week Treatment Duration 1 week   Oral Phase 06/13/2021 Oral Phase  Impaired Oral -  Pudding Teaspoon -- Oral - Pudding Cup -- Oral - Honey Teaspoon -- Oral - Honey Cup -- Oral - Nectar Teaspoon -- Oral - Nectar Cup NT Oral - Nectar Straw -- Oral - Thin Teaspoon WFL Oral - Thin Cup WFL Oral - Thin Straw Decreased bolus cohesion Oral - Puree WFL Oral - Mech Soft Impaired mastication;Delayed oral transit Oral - Regular NT Oral - Multi-Consistency Impaired mastication;Decreased bolus cohesion;Piecemeal swallowing Oral - Pill -- Oral Phase - Comment --  Pharyngeal Phase 06/13/2021 Pharyngeal Phase Impaired Pharyngeal- Pudding Teaspoon -- Pharyngeal -- Pharyngeal- Pudding Cup -- Pharyngeal -- Pharyngeal- Honey Teaspoon -- Pharyngeal -- Pharyngeal- Honey Cup -- Pharyngeal -- Pharyngeal- Nectar Teaspoon -- Pharyngeal -- Pharyngeal- Nectar Cup NT Pharyngeal -- Pharyngeal- Nectar Straw NT Pharyngeal -- Pharyngeal- Thin Teaspoon WFL Pharyngeal -- Pharyngeal- Thin Cup [facility] Pharyngeal Material does not enter airway Pharyngeal- Thin Straw Delayed swallow initiation-pyriform sinuses;Penetration/Aspiration during swallow Pharyngeal Material does not enter airway;Material enters airway, passes BELOW cords then ejected out Pharyngeal- Puree Delayed swallow initiation-vallecula Pharyngeal -- Pharyngeal- Mechanical Soft Delayed swallow initiation-vallecula Pharyngeal -- Pharyngeal- Regular NT Pharyngeal -- Pharyngeal- Multi-consistency Delayed swallow initiation-vallecula Pharyngeal -- Pharyngeal- Pill -- Pharyngeal -- Pharyngeal Comment --  Cervical Esophageal Phase  06/13/2021 Cervical Esophageal Phase WFL Pudding Teaspoon -- Pudding Cup -- Honey Teaspoon -- Honey Cup -- Nectar Teaspoon -- Nectar Cup -- Nectar Straw -- Thin Teaspoon -- Thin Cup -- Thin Straw -- Puree -- Mechanical Soft -- Regular -- Multi-consistency -- Pill -- Cervical Esophageal Comment -- PAYNE, COURTNEY 06/13/2021, 12:05 PM    Allen Anna, MA, CCC-SLP                  Recent Labs    06/13/21 0552  WBC 6.2  HGB 13.5  HCT  38.4*  PLT 193    Recent Labs    06/13/21 0552  NA 138  K 4.2  CL 103  CO2 22  GLUCOSE 116*  BUN 46*  CREATININE 2.01*  CALCIUM 9.7     Intake/Output Summary (Last 24 hours) at 06/14/2021 [id number] Last data filed at 06/13/2021 1812 Gross per 24 hour  Intake 120 ml  Output --  Net 120 ml        Physical Exam: Vital Signs Blood pressure (!) 176/79, pulse 69, temperature 98.1 F (36.7 C), resp. rate 18, height 5\' 9"  (1.753 m), weight 91.5 kg, SpO2 97 %. Gen: no distress, normal appearing, BMI 29.79  HEENT: oral mucosa pink and moist, NCAT Cardio: Reg rate Chest: normal effort, normal rate of breathing Abd: soft, non-distended Ext: no edema Psych: pleasant, normal affect Skin: intact Neurological: Alert Dysarthria but with good intelligibility LUE clumsiness.  Right side strength 5/5, left side 4/5. Sensation intact bilaterally. Dysphagia   Assessment/Plan: 1. Functional deficits which require 3+ hours per day of interdisciplinary therapy in a comprehensive inpatient rehab setting. Physiatrist is providing close team supervision and 24 hour management of active medical problems listed below. Physiatrist and rehab team continue to assess barriers to discharge/monitor patient progress toward functional and medical goals  Care Tool:  Bathing    Body parts bathed by patient: Left arm, Chest, Abdomen, Front perineal area, Buttocks, Left upper leg, Right upper leg, Left lower leg, Right lower leg, Face, Right arm   Body parts bathed by helper: Right arm     Bathing assist Assist Level: Supervision/Verbal cueing     Upper Body Dressing/Undressing Upper body dressing   What is the patient wearing?: Pull over shirt  Upper body assist Assist Level: Set up assist    Lower Body Dressing/Undressing Lower body dressing      What is the patient wearing?: Pants, Underwear/pull up     Lower body assist Assist for lower body dressing: Supervision/Verbal cueing      Toileting Toileting    Toileting assist Assist for toileting: Supervision/Verbal cueing     Transfers Chair/bed transfer  Transfers assist     Chair/bed transfer assist level: Independent with assistive device Chair/bed transfer assistive device: Walker, Clinical biochemist   Ambulation assist      Assist level: Supervision/Verbal cueing Assistive device: Walker-rolling Max distance: 300'   Walk 10 feet activity   Assist     Assist level: Independent with assistive device Assistive device: Walker-rolling   Walk 50 feet activity   Assist    Assist level: Supervision/Verbal cueing Assistive device: Walker-rolling    Walk 150 feet activity   Assist    Assist level: Supervision/Verbal cueing Assistive device: Walker-rolling    Walk 10 feet on uneven surface  activity   Assist     Assist level: Contact Guard/Touching assist Assistive device: Walker-rolling   Wheelchair     Assist Is the patient using a wheelchair?: No Type of Wheelchair: Manual    Wheelchair assist level: Minimal Assistance - Patient > 75% Max wheelchair distance: 55 ft    Wheelchair 50 feet with 2 turns activity    Assist        Assist Level: Minimal Assistance - Patient > 75%   Wheelchair 150 feet activity     Assist      Assist Level: Total Assistance - Patient < 25%   Blood pressure (!) 176/79, pulse 69, temperature 98.1 F (36.7 C), resp. rate 18, height 5\' 9"  (1.753 m), weight 91.5 kg, SpO2 97 %.    Medical Problem List and Plan: 1. 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  Antithrombotics: -DVT/anticoagulation:  Pharmaceutical: Heparin             -antiplatelet therapy: Plavix and aspirin 81 mg for 3 weeks then aspirin alone 3. Pain Management: Tylenol 4. Tearful as thought he would die from this stroke: LCSW to evaluate and provide emotional support.  Chaplain consulted             -antipsychotic agents: n/a 5. Neuropsych: This patient is capable of making decisions on his own behalf. 6. Skin/Wound Care: Routine skin care checks 7. Fluids/Electrolytes/Nutrition: routine Is and Os 8. Hypertension: continue atenolol,doxazosin             --recommended avoiding chlorthalidone secondary to hyponatremia             -- Lisinopril held  Continue magnesium  Discussed that we will give a 30 day supple of all medications 9: Hyperlipidemia: refusing meds due to side effects. Provided list of foods and supplements that can help lower LDL. 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 Overweight: provide dietary education.  12: Constipation: scheduled senna and PRNs. Discontinue milk of magnesia 13: Gout: No current flare.  Allopurinol held, discussed avoiding soda products/added sugar.  14. BPH: Increase Flomax to 0.8mg  HS. Monitor for loose stools as he had this reaction to HCTZ.  15. Hypomagnesemia: discussed with him that his magnesium levels are low. Increase magnesium gluconate  to 500 mg HS 16.  Hyponatremia Sodium 133 on 2/22, continue to monitor 17. Dry mouth: Biotene ordered. Improved with advancing diet to thins- discussed with him 18. Dysphagia: advance to thins.    >30 minutes spent in discharge of patient including review of medications and follow-up appointments, physical examination, and in answering all patient's questions    LOS: 8 days A FACE TO FACE EVALUATION WAS [place] 06/14/2021, 9:22 AM

## 2021-06-15 NOTE — Progress Notes (Signed)
Inpatient Rehabilitation Discharge Medication Review by a Pharmacist ? ?A complete drug regimen review was completed for this patient to identify any potential clinically significant medication issues. ? ?High Risk Drug Classes Is patient taking? Indication by Medication  ?Antipsychotic No   ?Anticoagulant No   ?Antibiotic No   ?Opioid No   ?Antiplatelet Yes Aspirin, plavix- CVA prophylaxis  ?Hypoglycemics/insulin No   ?Vasoactive Medication Yes Atenolol, doxazosin, flomax- hypertension, BPH  ?Chemotherapy No   ?Other No   ? ? ? ?Type of Medication Issue Identified Description of Issue Recommendation(s)  ?Drug Interaction(s) (clinically significant) ?    ?Duplicate Therapy ?    ?Allergy ?    ?No Medication Administration End Date ?    ?Incorrect Dose ?    ?Additional Drug Therapy Needed ?    ?Significant med changes from prior encounter (inform family/care partners about these prior to discharge).    ?Other ?    ? ? ?Clinically significant medication issues were identified that warrant physician communication and completion of prescribed/recommended actions by midnight of the next day:  No ? ? ?Time spent performing this drug regimen review (minutes):  30 ? ? ?Wilberto Console BS, PharmD, BCPS ?Clinical Pharmacist ?06/15/2021 9:44 AM ?

## 2021-06-21 ENCOUNTER — Telehealth: Payer: Self-pay | Admitting: *Deleted

## 2021-06-21 NOTE — Telephone Encounter (Signed)
Allen Torres OT from [facility] called for OT POC 1wk8.  Approval given. ?

## 2021-06-23 ENCOUNTER — Telehealth: Payer: Self-pay | Admitting: *Deleted

## 2021-06-23 NOTE — Telephone Encounter (Signed)
Allen Torres ST from [facility] called for POC 1wk4.  Approval given. ?

## 2021-06-30 ENCOUNTER — Telehealth: Payer: Self-pay | Admitting: *Deleted

## 2021-06-30 NOTE — Telephone Encounter (Signed)
[facility] ST called to report missed ST visit this week due to Mr Doucet was at his PCP for BP issues. Noted.  ?

## 2021-07-01 ENCOUNTER — Encounter (HOSPITAL_COMMUNITY): Payer: Self-pay

## 2021-07-01 ENCOUNTER — Emergency Department (HOSPITAL_COMMUNITY)
Admission: EM | Admit: 2021-07-01 | Discharge: 2021-07-02 | Disposition: A | Discharge status: Home / Self Care | Payer: Medicare HMO | Attending: Emergency Medicine | Admitting: Emergency Medicine

## 2021-07-01 ENCOUNTER — Other Ambulatory Visit: Payer: Self-pay

## 2021-07-01 DIAGNOSIS — Z79899 Other long term (current) drug therapy: Secondary | ICD-10-CM | POA: Insufficient documentation

## 2021-07-01 DIAGNOSIS — Z7982 Long term (current) use of aspirin: Secondary | ICD-10-CM | POA: Diagnosis not present

## 2021-07-01 DIAGNOSIS — I10 Essential (primary) hypertension: Secondary | ICD-10-CM | POA: Diagnosis present

## 2021-07-01 NOTE — ED Provider Triage Note (Signed)
Emergency Medicine Provider Triage Evaluation Note ? ?Allen Torres , a 81 y.o. male  was evaluated in triage.  Pt complains of hypertension.  He took his atenolol and night time blood pressure meds prior to arrival.  He states that his upper abdomen is hurting more he states that that is very common for him.  He states that it is outside of the normal.  He was recently restarted on multiple of his medications at low doses. ? ? ?Physical Exam  ?BP (!) 145/81 (BP Location: Right Arm)   Pulse 68   Temp (!) 97.5 ?F (36.4 ?C) (Oral)   Resp 18   SpO2 97%  ?Gen:   Awake, no distress   ?Resp:  Normal effort  ?MSK:   Moves extremities without difficulty  ?Other:  Patient is very hard of hearing.  Normal speech. ? ?Medical Decision Making  ?Medically screening exam initiated at 10:14 PM.  Appropriate orders placed.  Allen Torres was informed that the remainder of the evaluation will be completed by another provider, this initial triage assessment does not replace that evaluation, and the importance of remaining in the ED until their evaluation is complete. ? ?Patient recently had labs showing normal kidney function, and his blood pressure has normalized here.  With his abdominal discomfort while he does report that this is normal for him with his blood pressure normalizing I have a low suspicion however based on his recent history we will check EKG. ?  ?Cristina Gong, PA-C ?07/01/21 2227 ? ?

## 2021-07-01 NOTE — ED Triage Notes (Signed)
Pt said that his blood pressure read 200/98 at home and that his chest started hurting. Now the bp is 145/81 and the chest pain is turning into discomfort.  He said he was concerned because he had a recent stroke ?

## 2021-07-02 DIAGNOSIS — I10 Essential (primary) hypertension: Secondary | ICD-10-CM | POA: Diagnosis not present

## 2021-07-02 DIAGNOSIS — T466X5A Adverse effect of antihyperlipidemic and antiarteriosclerotic drugs, initial encounter: Secondary | ICD-10-CM

## 2021-07-02 DIAGNOSIS — I77819 Aortic ectasia, unspecified site: Secondary | ICD-10-CM | POA: Insufficient documentation

## 2021-07-02 DIAGNOSIS — I77810 Thoracic aortic ectasia: Secondary | ICD-10-CM

## 2021-07-02 DIAGNOSIS — M791 Myalgia, unspecified site: Secondary | ICD-10-CM | POA: Insufficient documentation

## 2021-07-02 HISTORY — DX: Adverse effect of antihyperlipidemic and antiarteriosclerotic drugs, initial encounter: T46.6X5A

## 2021-07-02 HISTORY — DX: Thoracic aortic ectasia: I77.810

## 2021-07-02 MED ORDER — HYDRALAZINE HCL 25 MG PO TABS
25.0000 mg | ORAL_TABLET | Freq: Once | ORAL | Status: AC
  Administered 2021-07-02: 25 mg via ORAL

## 2021-07-02 NOTE — Discharge Instructions (Signed)
Please increase your lisinopril from 20 mg once a day to 40 mg once a day.  Continue to monitor your blood pressure daily.  Return if you have any new or concerning symptoms. ?

## 2021-07-02 NOTE — ED Provider Notes (Signed)
?[facility] EMERGENCY DEPARTMENT ?Provider Note ? ? ?CSN: [id number] ?Arrival date & time: 07/01/21  2111 ? ?  ? ?History ? ?Chief Complaint  ?Patient presents with  ? Hypertension  ? ? ?Allen Torres is a 81 y.o. male. ? ?The history is provided by the patient.  ?Hypertension ?He has history of hypertension, hyperlipidemia, stroke and comes in because his blood pressure was very high at home.  Blood pressure was 220/117.  He had last had his blood pressure medication adjusted about 2 weeks ago.  He states that he was taken off of blood pressure medication while he was in the hospital, and just restarted.  He denies headache, tinnitus, epistaxis.  He denies chest pain. ?  ?Home Medications ?Prior to Admission medications   ?Medication Sig Start Date End Date Taking? Authorizing Provider  ?acetaminophen (TYLENOL) 325 MG tablet Take 1-2 tablets (325-650 mg total) by mouth every 4 (four) hours as needed for mild pain. 06/14/21  Yes Setzer, Lynnell Jude, PA-C  ?allopurinol (ZYLOPRIM) 100 MG tablet Take 100 mg by mouth daily. 06/27/21  Yes [provider]  ?antiseptic oral rinse (BIOTENE) LIQD 15 mLs by Mouth Rinse route as needed for dry mouth. 06/14/21  Yes Setzer, Lynnell Jude, PA-C  ?Ascorbic Acid (VITAMIN C) 1000 MG tablet Take 1,000 mg by mouth daily.   Yes [provider]  ?aspirin EC 81 MG EC tablet Take 1 tablet (81 mg total) by mouth daily. Swallow whole. 06/14/21  Yes Setzer, Lynnell Jude, PA-C  ?atenolol (TENORMIN) 50 MG tablet Take 50 mg by mouth 2 (two) times daily. 06/19/21  Yes [provider]  ?cholecalciferol (VITAMIN D3) 25 MCG (1000 UNIT) tablet Take 1,000 Units by mouth every other day.   Yes [provider]  ?doxazosin (CARDURA) 2 MG tablet Take 2 mg by mouth 2 (two) times daily.   Yes [provider]  ?fluticasone (FLONASE) 50 MCG/ACT nasal spray Place 1 spray into both nostrils daily as needed for allergies.  02/09/20  Yes [provider]   ?guaiFENesin-dextromethorphan (ROBITUSSIN DM) 100-10 MG/5ML syrup Take 5-10 mLs by mouth every 6 (six) hours as needed for cough. 06/14/21  Yes Setzer, Lynnell Jude, PA-C  ?lisinopril (ZESTRIL) 20 MG tablet Take 20 mg by mouth daily. 06/26/21  Yes [provider]  ?magnesium gluconate (MAGONATE) 500 MG tablet Take 1 tablet (500 mg total) by mouth at bedtime. 06/14/21  Yes Setzer, Lynnell Jude, PA-C  ?meclizine (ANTIVERT) 12.5 MG tablet Take 1 tablet (12.5 mg total) by mouth 3 (three) times daily as needed for dizziness. 06/06/21  Yes Lynn Ito, MD  ?senna (SENOKOT) 8.6 MG TABS tablet Take 1 tablet (8.6 mg total) by mouth 2 (two) times daily. ?Patient taking differently: Take 1 tablet by mouth daily as needed for mild constipation. 06/14/21  Yes Setzer, Lynnell Jude, PA-C  ?tamsulosin (FLOMAX) 0.4 MG CAPS capsule Take 2 capsules (0.8 mg total) by mouth daily after supper. 06/14/21  Yes Setzer, Lynnell Jude, PA-C  ?vitamin B-12 (CYANOCOBALAMIN) 100 MCG tablet Take 100 mcg by mouth daily.   Yes [provider]  ?zinc gluconate 50 MG tablet Take 50 mg by mouth daily.   Yes [provider]  ?atenolol (TENORMIN) 25 MG tablet Take 1 tablet (25 mg total) by mouth daily. ?Patient not taking: Reported on 07/02/2021 06/14/21   Milinda Antis, PA-C  ?doxazosin (CARDURA) 1 MG tablet Take 1 tablet (1 mg total) by mouth every 12 (twelve) hours. ?Patient not taking:  Reported on 07/02/2021 06/14/21   Milinda Antis, PA-C  ?doxazosin (CARDURA) 1 MG tablet Take 1 tablet (1 mg total) by mouth every 12 (twelve) hours. ?Patient not taking: Reported on 07/02/2021 06/14/21   Milinda Antis, PA-C  ?   ? ?Allergies    ?Hydrochlorothiazide, Indomethacin, and Statins   ? ?Review of Systems   ?Review of Systems  ?All other systems reviewed and are negative. ? ?Physical Exam ?Updated Vital Signs ?BP (!) 200/91 (BP Location: Right Arm)   Pulse 66   Temp (!) 97.5 ?F (36.4 ?C) (Oral)   Resp 17   Ht 5\' 9"  (1.753 m)   Wt 94.3 kg   SpO2 97%    BMI 30.72 kg/m?  ?Physical Exam ?Vitals and nursing note reviewed.  ?81 year old male, resting comfortably and in no acute distress. Vital signs are significant for elevated blood pressure. Oxygen saturation is 97%, which is normal. ?Head is normocephalic and atraumatic. PERRLA, EOMI. Oropharynx is clear. ?Neck is nontender and supple without adenopathy or JVD. ?Back is nontender and there is no CVA tenderness. ?Lungs are clear without rales, wheezes, or rhonchi. ?Chest is nontender. ?Heart has regular rate and rhythm without murmur. ?Abdomen is soft, flat, nontender. ?Extremities have no cyanosis or edema, full range of motion is present. ?Skin is warm and dry without rash. ?Neurologic: Mental status is normal, cranial nerves are intact, moves all extremities equally. ? ?ED Results / Procedures / Treatments   ? ?Procedures ?Procedures  ? ? ?Medications Ordered in ED ?Medications  ?hydrALAZINE (APRESOLINE) tablet 25 mg (has no administration in time range)  ? ? ?ED Course/ Medical Decision Making/ A&P ?  ?                        ?Medical Decision Making ?Risk ?Prescription drug management. ? ? ?Poorly controlled blood pressure.  Old records reviewed confirming hospitalization for stroke on 06/01/2021.  Patient last had his blood pressure medication titrated about 2 weeks ago, he will need further uptitration.  He is advised to increase lisinopril from 20 mg once a day to 40 mg once a day.  He has an appointment already scheduled with his primary care provider in 9 days, and he is to keep that appointment.  In the meantime, he is to continue to monitor his blood pressure daily. ? ? ? ? ? ? ? ?Final Clinical Impression(s) / ED Diagnoses ?Final diagnoses:  ?Poorly-controlled hypertension  ? ? ?Rx / DC Orders ?ED Discharge Orders   ? ? None  ? ?  ? ? ?  ?06/03/2021, MD ?07/02/21 0413 ? ?

## 2021-07-04 ENCOUNTER — Telehealth: Payer: Self-pay

## 2021-07-04 NOTE — Telephone Encounter (Signed)
Trip the Physical Therapy Assistant [phone]) with [facility] called:  ? ?  Mr. Flora Lipps blood pressure reading was 185/85, with no complaints. Patient was seen in the ED this past weekend for elevated blood pressure reading.  ? ?His Hospital follow up with you  is on 11/20/2021 for Stroke follow up. Per discharge summary: ?   ?Specialty: Family Medicine ?Contact information: ?[address] ?[place] [address] ?[phone] ? ?I called to confirm the follow PCP appointment. It is on Tuesday 07/11/2021. Trip the PTA has been advised to call the PCP for advice.  ? ? ? ? ? ? ? ?

## 2021-07-10 ENCOUNTER — Telehealth: Payer: Self-pay | Admitting: *Deleted

## 2021-07-10 NOTE — Telephone Encounter (Signed)
[facility] called for request to extend ST visits 1wk2.  Approval given. ?

## 2021-09-19 DIAGNOSIS — I350 Nonrheumatic aortic (valve) stenosis: Secondary | ICD-10-CM

## 2021-09-19 DIAGNOSIS — Q2381 Bicuspid aortic valve: Secondary | ICD-10-CM

## 2021-09-19 DIAGNOSIS — Q231 Congenital insufficiency of aortic valve: Secondary | ICD-10-CM | POA: Insufficient documentation

## 2021-09-19 HISTORY — DX: Bicuspid aortic valve: Q23.81

## 2021-09-19 HISTORY — DX: Nonrheumatic aortic (valve) stenosis: I35.0

## 2021-11-20 ENCOUNTER — Encounter: Payer: Medicare HMO | Admitting: Physical Medicine and Rehabilitation

## 2022-02-05 DIAGNOSIS — Z8673 Personal history of transient ischemic attack (TIA), and cerebral infarction without residual deficits: Secondary | ICD-10-CM

## 2022-02-05 HISTORY — DX: Personal history of transient ischemic attack (TIA), and cerebral infarction without residual deficits: Z86.73

## 2022-12-24 ENCOUNTER — Encounter (HOSPITAL_COMMUNITY): Payer: Self-pay | Admitting: Emergency Medicine

## 2022-12-24 ENCOUNTER — Emergency Department (HOSPITAL_COMMUNITY): Payer: Medicare HMO

## 2022-12-24 ENCOUNTER — Inpatient Hospital Stay (HOSPITAL_COMMUNITY)
Admission: EM | Admit: 2022-12-24 | Discharge: 2022-12-28 | DRG: 280 | Disposition: A | Discharge status: Hospice | Payer: Medicare HMO | Attending: Internal Medicine | Admitting: Internal Medicine

## 2022-12-24 ENCOUNTER — Other Ambulatory Visit: Payer: Self-pay

## 2022-12-24 DIAGNOSIS — I5021 Acute systolic (congestive) heart failure: Secondary | ICD-10-CM | POA: Insufficient documentation

## 2022-12-24 DIAGNOSIS — Z9049 Acquired absence of other specified parts of digestive tract: Secondary | ICD-10-CM

## 2022-12-24 DIAGNOSIS — I2489 Other forms of acute ischemic heart disease: Secondary | ICD-10-CM | POA: Diagnosis present

## 2022-12-24 DIAGNOSIS — Z888 Allergy status to other drugs, medicaments and biological substances status: Secondary | ICD-10-CM

## 2022-12-24 DIAGNOSIS — I272 Pulmonary hypertension, unspecified: Secondary | ICD-10-CM | POA: Diagnosis present

## 2022-12-24 DIAGNOSIS — Z823 Family history of stroke: Secondary | ICD-10-CM

## 2022-12-24 DIAGNOSIS — I130 Hypertensive heart and chronic kidney disease with heart failure and stage 1 through stage 4 chronic kidney disease, or unspecified chronic kidney disease: Principal | ICD-10-CM | POA: Diagnosis present

## 2022-12-24 DIAGNOSIS — N1831 Chronic kidney disease, stage 3a: Secondary | ICD-10-CM | POA: Diagnosis present

## 2022-12-24 DIAGNOSIS — J9601 Acute respiratory failure with hypoxia: Secondary | ICD-10-CM | POA: Diagnosis present

## 2022-12-24 DIAGNOSIS — I7121 Aneurysm of the ascending aorta, without rupture: Secondary | ICD-10-CM | POA: Diagnosis present

## 2022-12-24 DIAGNOSIS — I440 Atrioventricular block, first degree: Secondary | ICD-10-CM | POA: Diagnosis present

## 2022-12-24 DIAGNOSIS — I214 Non-ST elevation (NSTEMI) myocardial infarction: Secondary | ICD-10-CM | POA: Diagnosis present

## 2022-12-24 DIAGNOSIS — Z8673 Personal history of transient ischemic attack (TIA), and cerebral infarction without residual deficits: Secondary | ICD-10-CM

## 2022-12-24 DIAGNOSIS — Z1152 Encounter for screening for COVID-19: Secondary | ICD-10-CM

## 2022-12-24 DIAGNOSIS — R0602 Shortness of breath: Secondary | ICD-10-CM | POA: Diagnosis not present

## 2022-12-24 DIAGNOSIS — Z6830 Body mass index (BMI) 30.0-30.9, adult: Secondary | ICD-10-CM

## 2022-12-24 DIAGNOSIS — Z8269 Family history of other diseases of the musculoskeletal system and connective tissue: Secondary | ICD-10-CM

## 2022-12-24 DIAGNOSIS — Z809 Family history of malignant neoplasm, unspecified: Secondary | ICD-10-CM

## 2022-12-24 DIAGNOSIS — Z66 Do not resuscitate: Secondary | ICD-10-CM | POA: Diagnosis present

## 2022-12-24 DIAGNOSIS — E782 Mixed hyperlipidemia: Secondary | ICD-10-CM | POA: Diagnosis present

## 2022-12-24 DIAGNOSIS — I10 Essential (primary) hypertension: Secondary | ICD-10-CM | POA: Diagnosis present

## 2022-12-24 DIAGNOSIS — I509 Heart failure, unspecified: Secondary | ICD-10-CM

## 2022-12-24 DIAGNOSIS — Z7982 Long term (current) use of aspirin: Secondary | ICD-10-CM

## 2022-12-24 DIAGNOSIS — I060 Rheumatic aortic stenosis: Secondary | ICD-10-CM | POA: Diagnosis present

## 2022-12-24 DIAGNOSIS — Z79899 Other long term (current) drug therapy: Secondary | ICD-10-CM

## 2022-12-24 DIAGNOSIS — E669 Obesity, unspecified: Secondary | ICD-10-CM | POA: Diagnosis present

## 2022-12-24 DIAGNOSIS — Z87891 Personal history of nicotine dependence: Secondary | ICD-10-CM

## 2022-12-24 DIAGNOSIS — R0600 Dyspnea, unspecified: Secondary | ICD-10-CM

## 2022-12-24 LAB — COMPREHENSIVE METABOLIC PANEL
ALT: 17 U/L (ref 0–44)
AST: 31 U/L (ref 15–41)
Albumin: 3.4 g/dL — ABNORMAL LOW (ref 3.5–5.0)
Alkaline Phosphatase: 59 U/L (ref 38–126)
Anion gap: 8 (ref 5–15)
BUN: 22 mg/dL (ref 8–23)
CO2: 24 mmol/L (ref 22–32)
Calcium: 8.6 mg/dL — ABNORMAL LOW (ref 8.9–10.3)
Chloride: 101 mmol/L (ref 98–111)
Creatinine, Ser: 1.49 mg/dL — ABNORMAL HIGH (ref 0.61–1.24)
GFR, Estimated: 47 mL/min — ABNORMAL LOW (ref >60)
Glucose, Bld: 130 mg/dL — ABNORMAL HIGH (ref 70–99)
Potassium: 4.6 mmol/L (ref 3.5–5.1)
Sodium: 133 mmol/L — ABNORMAL LOW (ref 135–145)
Total Bilirubin: 1.1 mg/dL (ref 0.3–1.2)
Total Protein: 5.9 g/dL — ABNORMAL LOW (ref 6.5–8.1)

## 2022-12-24 LAB — CBC WITH DIFFERENTIAL/PLATELET
Abs Immature Granulocytes: 0.03 10*3/uL (ref 0.00–0.07)
Basophils Absolute: 0 10*3/uL (ref 0.0–0.1)
Basophils Relative: 0 %
Eosinophils Absolute: 0.3 10*3/uL (ref 0.0–0.5)
Eosinophils Relative: 4 %
HCT: 34.5 % — ABNORMAL LOW (ref 39.0–52.0)
Hemoglobin: 11.8 g/dL — ABNORMAL LOW (ref 13.0–17.0)
Immature Granulocytes: 0 %
Lymphocytes Relative: 13 %
Lymphs Abs: 1 10*3/uL (ref 0.7–4.0)
MCH: 33.7 pg (ref 26.0–34.0)
MCHC: 34.2 g/dL (ref 30.0–36.0)
MCV: 98.6 fL (ref 80.0–100.0)
Monocytes Absolute: 1.3 10*3/uL — ABNORMAL HIGH (ref 0.1–1.0)
Monocytes Relative: 16 %
Neutro Abs: 5.2 10*3/uL (ref 1.7–7.7)
Neutrophils Relative %: 67 %
Platelets: 161 10*3/uL (ref 150–400)
RBC: 3.5 MIL/uL — ABNORMAL LOW (ref 4.22–5.81)
RDW: 13.9 % (ref 11.5–15.5)
WBC: 7.8 10*3/uL (ref 4.0–10.5)
nRBC: 0 % (ref 0.0–0.2)

## 2022-12-24 LAB — TROPONIN I (HIGH SENSITIVITY): Troponin I (High Sensitivity): 1916 ng/L (ref <18)

## 2022-12-24 LAB — SARS CORONAVIRUS 2 BY RT PCR: SARS Coronavirus 2 by RT PCR: NEGATIVE

## 2022-12-24 LAB — BRAIN NATRIURETIC PEPTIDE: B Natriuretic Peptide: 1659.5 pg/mL — ABNORMAL HIGH (ref 0.0–100.0)

## 2022-12-24 MED ORDER — ASPIRIN 81 MG PO CHEW
324.0000 mg | CHEWABLE_TABLET | Freq: Once | ORAL | Status: AC
  Administered 2022-12-25: 324 mg via ORAL
  Filled 2022-12-24: qty 4

## 2022-12-24 MED ORDER — FUROSEMIDE 10 MG/ML IJ SOLN
40.0000 mg | Freq: Once | INTRAMUSCULAR | Status: AC
  Administered 2022-12-25: 40 mg via INTRAVENOUS
  Filled 2022-12-24: qty 4

## 2022-12-24 NOTE — ED Provider Triage Note (Signed)
Emergency Medicine Provider Triage Evaluation Note  Allen Torres , a 82 y.o. male  was evaluated in triage.  Pt complains of shortness of breath.  He states he started having chest congestion approximately 1 week ago.  He has been taking OTC medications without relief.  No edema, fevers, chest pain, lower extremity edema.  Review of Systems  Positive: As above Negative: As above  Physical Exam  BP (!) 142/71 (BP Location: Right Arm)   Pulse 80   Temp 98.4 F (36.9 C)   Resp 18   Ht 5\' 9"  (1.753 m)   Wt 94.3 kg   SpO2 93%   BMI 30.72 kg/m  Gen:   Awake, no distress   Resp:  Normal effort, speaks in full sentences, end-expiratory wheezing MSK:   Moves extremities without difficulty  Other:    Medical Decision Making  Medically screening exam initiated at 9:09 PM.  Appropriate orders placed.  [name] was informed that the remainder of the evaluation will be completed by another provider, this initial triage assessment does not replace that evaluation, and the importance of remaining in the ED until their evaluation is complete.     Lenard Simmer, [place] 12/24/22 2111

## 2022-12-24 NOTE — ED Triage Notes (Signed)
Patient coming to ED for increasing SHOB.  States he started having chest congestion one week ago. Has tried OTC medications without relief.  No fevers reports.  No edema or pain reported.

## 2022-12-25 ENCOUNTER — Emergency Department (HOSPITAL_COMMUNITY): Payer: Medicare HMO

## 2022-12-25 ENCOUNTER — Inpatient Hospital Stay (HOSPITAL_COMMUNITY): Payer: Medicare HMO

## 2022-12-25 ENCOUNTER — Encounter (HOSPITAL_COMMUNITY): Payer: Self-pay | Admitting: Family Medicine

## 2022-12-25 DIAGNOSIS — Z79899 Other long term (current) drug therapy: Secondary | ICD-10-CM | POA: Diagnosis not present

## 2022-12-25 DIAGNOSIS — R0602 Shortness of breath: Secondary | ICD-10-CM | POA: Diagnosis present

## 2022-12-25 DIAGNOSIS — J9601 Acute respiratory failure with hypoxia: Secondary | ICD-10-CM | POA: Diagnosis present

## 2022-12-25 DIAGNOSIS — I5021 Acute systolic (congestive) heart failure: Secondary | ICD-10-CM

## 2022-12-25 DIAGNOSIS — Z8673 Personal history of transient ischemic attack (TIA), and cerebral infarction without residual deficits: Secondary | ICD-10-CM

## 2022-12-25 DIAGNOSIS — Z8269 Family history of other diseases of the musculoskeletal system and connective tissue: Secondary | ICD-10-CM | POA: Diagnosis not present

## 2022-12-25 DIAGNOSIS — R7989 Other specified abnormal findings of blood chemistry: Secondary | ICD-10-CM

## 2022-12-25 DIAGNOSIS — N1831 Chronic kidney disease, stage 3a: Secondary | ICD-10-CM | POA: Diagnosis present

## 2022-12-25 DIAGNOSIS — I509 Heart failure, unspecified: Secondary | ICD-10-CM

## 2022-12-25 DIAGNOSIS — I272 Pulmonary hypertension, unspecified: Secondary | ICD-10-CM | POA: Diagnosis present

## 2022-12-25 DIAGNOSIS — Z6830 Body mass index (BMI) 30.0-30.9, adult: Secondary | ICD-10-CM | POA: Diagnosis not present

## 2022-12-25 DIAGNOSIS — E669 Obesity, unspecified: Secondary | ICD-10-CM | POA: Diagnosis present

## 2022-12-25 DIAGNOSIS — Z87891 Personal history of nicotine dependence: Secondary | ICD-10-CM | POA: Diagnosis not present

## 2022-12-25 DIAGNOSIS — Z809 Family history of malignant neoplasm, unspecified: Secondary | ICD-10-CM | POA: Diagnosis not present

## 2022-12-25 DIAGNOSIS — Z888 Allergy status to other drugs, medicaments and biological substances status: Secondary | ICD-10-CM | POA: Diagnosis not present

## 2022-12-25 DIAGNOSIS — E782 Mixed hyperlipidemia: Secondary | ICD-10-CM | POA: Diagnosis present

## 2022-12-25 DIAGNOSIS — I060 Rheumatic aortic stenosis: Secondary | ICD-10-CM | POA: Diagnosis present

## 2022-12-25 DIAGNOSIS — I440 Atrioventricular block, first degree: Secondary | ICD-10-CM | POA: Diagnosis present

## 2022-12-25 DIAGNOSIS — Z66 Do not resuscitate: Secondary | ICD-10-CM | POA: Diagnosis present

## 2022-12-25 DIAGNOSIS — Z823 Family history of stroke: Secondary | ICD-10-CM | POA: Diagnosis not present

## 2022-12-25 DIAGNOSIS — Z1152 Encounter for screening for COVID-19: Secondary | ICD-10-CM | POA: Diagnosis not present

## 2022-12-25 DIAGNOSIS — I214 Non-ST elevation (NSTEMI) myocardial infarction: Secondary | ICD-10-CM | POA: Diagnosis present

## 2022-12-25 DIAGNOSIS — Z7982 Long term (current) use of aspirin: Secondary | ICD-10-CM | POA: Diagnosis not present

## 2022-12-25 DIAGNOSIS — Z9049 Acquired absence of other specified parts of digestive tract: Secondary | ICD-10-CM | POA: Diagnosis not present

## 2022-12-25 DIAGNOSIS — I7121 Aneurysm of the ascending aorta, without rupture: Secondary | ICD-10-CM | POA: Diagnosis present

## 2022-12-25 DIAGNOSIS — I350 Nonrheumatic aortic (valve) stenosis: Secondary | ICD-10-CM | POA: Diagnosis not present

## 2022-12-25 DIAGNOSIS — I2489 Other forms of acute ischemic heart disease: Secondary | ICD-10-CM | POA: Diagnosis present

## 2022-12-25 DIAGNOSIS — I130 Hypertensive heart and chronic kidney disease with heart failure and stage 1 through stage 4 chronic kidney disease, or unspecified chronic kidney disease: Secondary | ICD-10-CM | POA: Diagnosis present

## 2022-12-25 HISTORY — DX: Aneurysm of the ascending aorta, without rupture: I71.21

## 2022-12-25 HISTORY — DX: Heart failure, unspecified: I50.9

## 2022-12-25 HISTORY — DX: Acute systolic (congestive) heart failure: I50.21

## 2022-12-25 LAB — BASIC METABOLIC PANEL
Anion gap: 12 (ref 5–15)
BUN: 24 mg/dL — ABNORMAL HIGH (ref 8–23)
CO2: 21 mmol/L — ABNORMAL LOW (ref 22–32)
Calcium: 8.4 mg/dL — ABNORMAL LOW (ref 8.9–10.3)
Chloride: 100 mmol/L (ref 98–111)
Creatinine, Ser: 1.43 mg/dL — ABNORMAL HIGH (ref 0.61–1.24)
GFR, Estimated: 49 mL/min — ABNORMAL LOW (ref >60)
Glucose, Bld: 104 mg/dL — ABNORMAL HIGH (ref 70–99)
Potassium: 4.1 mmol/L (ref 3.5–5.1)
Sodium: 133 mmol/L — ABNORMAL LOW (ref 135–145)

## 2022-12-25 LAB — CBC
HCT: 30.2 % — ABNORMAL LOW (ref 39.0–52.0)
Hemoglobin: 10.2 g/dL — ABNORMAL LOW (ref 13.0–17.0)
MCH: 32.7 pg (ref 26.0–34.0)
MCHC: 33.8 g/dL (ref 30.0–36.0)
MCV: 96.8 fL (ref 80.0–100.0)
Platelets: 156 10*3/uL (ref 150–400)
RBC: 3.12 MIL/uL — ABNORMAL LOW (ref 4.22–5.81)
RDW: 13.9 % (ref 11.5–15.5)
WBC: 6.8 10*3/uL (ref 4.0–10.5)
nRBC: 0 % (ref 0.0–0.2)

## 2022-12-25 LAB — ECHOCARDIOGRAM COMPLETE
AR max vel: 0.92 cm2
AV Area VTI: 0.95 cm2
AV Area mean vel: 0.92 cm2
AV Mean grad: 18 mmHg
AV Peak grad: 32 mmHg
Ao pk vel: 2.83 m/s
Area-P 1/2: 4.36 cm2
Height: 69 in
S' Lateral: 4.5 cm
Weight: 3328 [oz_av]

## 2022-12-25 LAB — I-STAT VENOUS BLOOD GAS, ED
Acid-base deficit: 5 mmol/L — ABNORMAL HIGH (ref 0.0–2.0)
Bicarbonate: 19.4 mmol/L — ABNORMAL LOW (ref 20.0–28.0)
Calcium, Ion: 1.13 mmol/L — ABNORMAL LOW (ref 1.15–1.40)
HCT: 31 % — ABNORMAL LOW (ref 39.0–52.0)
Hemoglobin: 10.5 g/dL — ABNORMAL LOW (ref 13.0–17.0)
O2 Saturation: 85 %
Potassium: 4.3 mmol/L (ref 3.5–5.1)
Sodium: 132 mmol/L — ABNORMAL LOW (ref 135–145)
TCO2: 20 mmol/L — ABNORMAL LOW (ref 22–32)
pCO2, Ven: 33.3 mmHg — ABNORMAL LOW (ref 44–60)
pH, Ven: 7.374 (ref 7.25–7.43)
pO2, Ven: 50 mmHg — ABNORMAL HIGH (ref 32–45)

## 2022-12-25 LAB — HEPARIN LEVEL (UNFRACTIONATED)
Heparin Unfractionated: 0.26 [IU]/mL — ABNORMAL LOW (ref 0.30–0.70)
Heparin Unfractionated: 0.35 [IU]/mL (ref 0.30–0.70)

## 2022-12-25 LAB — TROPONIN I (HIGH SENSITIVITY)
Troponin I (High Sensitivity): 2106 ng/L (ref <18)
Troponin I (High Sensitivity): 2387 ng/L (ref <18)

## 2022-12-25 MED ORDER — SENNOSIDES-DOCUSATE SODIUM 8.6-50 MG PO TABS
1.0000 | ORAL_TABLET | Freq: Every evening | ORAL | Status: DC | PRN
  Filled 2022-12-25: qty 1

## 2022-12-25 MED ORDER — HEPARIN BOLUS VIA INFUSION
4000.0000 [IU] | Freq: Once | INTRAVENOUS | Status: AC
  Administered 2022-12-25: 4000 [IU] via INTRAVENOUS
  Filled 2022-12-25: qty 4000

## 2022-12-25 MED ORDER — ACETAMINOPHEN 650 MG RE SUPP: 650.0000 mg | Freq: Four times a day (QID) | RECTAL | Status: DC | PRN

## 2022-12-25 MED ORDER — ACETAMINOPHEN 325 MG PO TABS: 650.0000 mg | ORAL_TABLET | Freq: Four times a day (QID) | ORAL | Status: DC | PRN

## 2022-12-25 MED ORDER — GUAIFENESIN-DM 100-10 MG/5ML PO SYRP
5.0000 mL | ORAL_SOLUTION | ORAL | Status: DC | PRN
  Administered 2022-12-27: 5 mL via ORAL
  Filled 2022-12-25: qty 5

## 2022-12-25 MED ORDER — ONDANSETRON HCL 4 MG/2ML IJ SOLN: 4.0000 mg | Freq: Four times a day (QID) | INTRAMUSCULAR | Status: DC | PRN

## 2022-12-25 MED ORDER — ONDANSETRON HCL 4 MG PO TABS: 4.0000 mg | ORAL_TABLET | Freq: Four times a day (QID) | ORAL | Status: DC | PRN

## 2022-12-25 MED ORDER — FUROSEMIDE 10 MG/ML IJ SOLN
40.0000 mg | Freq: Two times a day (BID) | INTRAMUSCULAR | Status: DC
  Administered 2022-12-25 – 2022-12-27 (×5): 40 mg via INTRAVENOUS
  Filled 2022-12-25 (×5): qty 4

## 2022-12-25 MED ORDER — LORAZEPAM 2 MG/ML IJ SOLN: 0.5000 mg | Freq: Once | INTRAMUSCULAR | Status: DC

## 2022-12-25 MED ORDER — IOHEXOL 350 MG/ML SOLN
75.0000 mL | Freq: Once | INTRAVENOUS | Status: AC | PRN
  Administered 2022-12-25: 75 mL via INTRAVENOUS

## 2022-12-25 MED ORDER — SODIUM CHLORIDE 0.9% FLUSH
3.0000 mL | Freq: Two times a day (BID) | INTRAVENOUS | Status: DC
  Administered 2022-12-25 – 2022-12-28 (×7): 3 mL via INTRAVENOUS

## 2022-12-25 MED ORDER — ASPIRIN 81 MG PO TBEC
81.0000 mg | DELAYED_RELEASE_TABLET | Freq: Every day | ORAL | Status: DC
  Administered 2022-12-25 – 2022-12-27 (×3): 81 mg via ORAL
  Filled 2022-12-25 (×3): qty 1

## 2022-12-25 MED ORDER — DOXAZOSIN MESYLATE 4 MG PO TABS
4.0000 mg | ORAL_TABLET | Freq: Two times a day (BID) | ORAL | Status: DC
  Administered 2022-12-25 – 2022-12-28 (×7): 4 mg via ORAL
  Filled 2022-12-25 (×8): qty 1

## 2022-12-25 MED ORDER — HEPARIN (PORCINE) 25000 UT/250ML-% IV SOLN
1250.0000 [IU]/h | INTRAVENOUS | Status: DC
  Administered 2022-12-25: 1250 [IU]/h via INTRAVENOUS
  Administered 2022-12-25: 1100 [IU]/h via INTRAVENOUS
  Filled 2022-12-25 (×2): qty 250

## 2022-12-25 NOTE — ED Provider Notes (Signed)
[place] EMERGENCY DEPARTMENT AT [facility] Provider Note   CSN: [id number] Arrival date & time: 12/24/22  2029     History  Chief Complaint  Patient presents with   Shortness of Breath    Allen Torres is a 82 y.o. male.  82 year old male who presents ER today secondary to "congestion".  Patient states for the last week or so he is a progressively worsening dyspnea.  Worse with exertion and laying flat.  Hears some rattling noises and wheezing so thought it was more congestion.  Has had a little bit of sinus pressure as well that seems more like a regular headache.  No history of smoking.  Did have a stroke about 18 months ago.  Is on aspirin for that.  No known history of heart failure or coronary artery disease.  No lower extremity swelling or abdominal distention.  No other associated symptoms.   Shortness of Breath      Home Medications Prior to Admission medications   Medication Sig Start Date End Date Taking? Authorizing Provider  acetaminophen (TYLENOL) 325 MG tablet Take 1-2 tablets (325-650 mg total) by mouth every 4 (four) hours as needed for mild pain. 06/14/21   Setzer, Lynnell Jude, PA-C  allopurinol (ZYLOPRIM) 100 MG tablet Take 100 mg by mouth daily. 06/27/21   [provider]  antiseptic oral rinse (BIOTENE) LIQD 15 mLs by Mouth Rinse route as needed for dry mouth. 06/14/21   Setzer, Lynnell Jude, PA-C  Ascorbic Acid (VITAMIN C) 1000 MG tablet Take 1,000 mg by mouth daily.    [provider]  aspirin EC 81 MG EC tablet Take 1 tablet (81 mg total) by mouth daily. Swallow whole. 06/14/21   Setzer, Lynnell Jude, PA-C  atenolol (TENORMIN) 25 MG tablet Take 1 tablet (25 mg total) by mouth daily. Patient not taking: Reported on 07/02/2021 06/14/21   Milinda Antis, PA-C  atenolol (TENORMIN) 50 MG tablet Take 50 mg by mouth 2 (two) times daily. 06/19/21   [provider]  cholecalciferol (VITAMIN D3) 25 MCG (1000 UNIT) tablet Take 1,000 Units by mouth  every other day.    [provider]  doxazosin (CARDURA) 1 MG tablet Take 1 tablet (1 mg total) by mouth every 12 (twelve) hours. Patient not taking: Reported on 07/02/2021 06/14/21   Milinda Antis, PA-C  doxazosin (CARDURA) 1 MG tablet Take 1 tablet (1 mg total) by mouth every 12 (twelve) hours. Patient not taking: Reported on 07/02/2021 06/14/21   Milinda Antis, PA-C  doxazosin (CARDURA) 2 MG tablet Take 2 mg by mouth 2 (two) times daily.    [provider]  fluticasone (FLONASE) 50 MCG/ACT nasal spray Place 1 spray into both nostrils daily as needed for allergies.  02/09/20   [provider]  guaiFENesin-dextromethorphan (ROBITUSSIN DM) 100-10 MG/5ML syrup Take 5-10 mLs by mouth every 6 (six) hours as needed for cough. 06/14/21   Setzer, Lynnell Jude, PA-C  lisinopril (ZESTRIL) 20 MG tablet Take 20 mg by mouth daily. 06/26/21   [provider]  magnesium gluconate (MAGONATE) 500 MG tablet Take 1 tablet (500 mg total) by mouth at bedtime. 06/14/21   Setzer, Lynnell Jude, PA-C  meclizine (ANTIVERT) 12.5 MG tablet Take 1 tablet (12.5 mg total) by mouth 3 (three) times daily as needed for dizziness. 06/06/21   Lynn Ito, MD  senna (SENOKOT) 8.6 MG TABS tablet Take 1 tablet (8.6 mg total) by mouth 2 (two) times daily. Patient taking differently:  Take 1 tablet by mouth daily as needed for mild constipation. 06/14/21   Setzer, Lynnell Jude, PA-C  tamsulosin (FLOMAX) 0.4 MG CAPS capsule Take 2 capsules (0.8 mg total) by mouth daily after supper. 06/14/21   Setzer, Lynnell Jude, PA-C  vitamin B-12 (CYANOCOBALAMIN) 100 MCG tablet Take 100 mcg by mouth daily.    [provider]  zinc gluconate 50 MG tablet Take 50 mg by mouth daily.    [provider]      Allergies    Hydrochlorothiazide, Indomethacin, and Statins    Review of Systems   Review of Systems  Respiratory:  Positive for shortness of breath.     Physical Exam Updated Vital Signs BP (!) 114/56   Pulse 77    Temp 98.4 F (36.9 C) (Oral)   Resp (!) 32   Ht 5\' 9"  (1.753 m)   Wt 94.3 kg   SpO2 91%   BMI 30.72 kg/m  Physical Exam Vitals and nursing note reviewed.  Constitutional:      Appearance: He is well-developed.  HENT:     Head: Normocephalic and atraumatic.  Cardiovascular:     Rate and Rhythm: Normal rate.  Pulmonary:     Effort: Pulmonary effort is normal. Tachypnea present. No respiratory distress.     Breath sounds: Decreased breath sounds and rales present.  Chest:     Chest wall: No edema.  Abdominal:     General: There is no distension.  Musculoskeletal:        General: Normal range of motion.     Cervical back: Normal range of motion.     Right lower leg: No edema.     Left lower leg: No edema.  Skin:    General: Skin is warm and dry.  Neurological:     Mental Status: He is alert.     ED Results / Procedures / Treatments   Labs (all labs ordered are listed, but only abnormal results are displayed) Labs Reviewed  CBC WITH DIFFERENTIAL/PLATELET - Abnormal; Notable for the following components:      Result Value   RBC 3.50 (*)    Hemoglobin 11.8 (*)    HCT 34.5 (*)    Monocytes Absolute 1.3 (*)    All other components within normal limits  COMPREHENSIVE METABOLIC PANEL - Abnormal; Notable for the following components:   Sodium 133 (*)    Glucose, Bld 130 (*)    Creatinine, Ser 1.49 (*)    Calcium 8.6 (*)    Total Protein 5.9 (*)    Albumin 3.4 (*)    GFR, Estimated 47 (*)    All other components within normal limits  BRAIN NATRIURETIC PEPTIDE - Abnormal; Notable for the following components:   B Natriuretic Peptide 1,659.5 (*)    All other components within normal limits  TROPONIN I (HIGH SENSITIVITY) - Abnormal; Notable for the following components:   Troponin I (High Sensitivity) 1,916 (*)    All other components within normal limits  TROPONIN I (HIGH SENSITIVITY) - Abnormal; Notable for the following components:   Troponin I (High Sensitivity)  2,106 (*)    All other components within normal limits  SARS CORONAVIRUS 2 BY RT PCR  HEPARIN LEVEL (UNFRACTIONATED)    EKG EKG Interpretation Date/Time:  Monday December 24 2022 21:08:11 EDT Ventricular Rate:  77 PR Interval:  248 QRS Duration:  94 QT Interval:  386 QTC Calculation: 436 R Axis:   26  Text Interpretation: Sinus rhythm with 1st  degree A-V block Possible Left atrial enlargement Inferior infarct , age undetermined Anterolateral infarct , age undetermined Abnormal ECG When compared with ECG of 24-Dec-2022 21:02, PREVIOUS ECG IS PRESENT  some baseline wander but appears to have new ST changes in lateral leads Confirmed by Marily Memos [phone]) on 12/24/2022 11:08:42 PM  Radiology DG Chest 2 View  Result Date: 12/24/2022 CLINICAL DATA:  Dyspnea EXAM: CHEST - 2 VIEW COMPARISON:  None Available. FINDINGS: Lung volumes are small. Diffuse interstitial pulmonary infiltrate is present most in keeping with moderate interstitial pulmonary edema. Small bilateral pleural effusions are present. No pneumothorax. Cardiac size within normal limits. No acute bone abnormality. IMPRESSION: 1. Moderate interstitial pulmonary edema with small bilateral pleural effusions. Electronically Signed   By: Helyn Numbers M.D.   On: 12/24/2022 22:58    Procedures .Critical Care  Performed by: Marily Memos, MD Authorized by: Marily Memos, MD   Critical care provider statement:    Critical care time (minutes):  30   Critical care was necessary to treat or prevent imminent or life-threatening deterioration of the following conditions:  Respiratory failure, circulatory failure and cardiac failure   Critical care was time spent personally by me on the following activities:  Development of treatment plan with patient or surrogate, discussions with consultants, evaluation of patient's response to treatment, examination of patient, ordering and review of laboratory studies, ordering and review of radiographic  studies, ordering and performing treatments and interventions, pulse oximetry, re-evaluation of patient's condition and review of old charts     Medications Ordered in ED Medications  heparin ADULT infusion 100 units/mL (25000 units/280mL) (1,100 Units/hr Intravenous New Bag/Given 12/25/22 0110)  furosemide (LASIX) injection 40 mg (40 mg Intravenous Given 12/25/22 0023)  aspirin chewable tablet 324 mg (324 mg Oral Given 12/25/22 0024)  heparin bolus via infusion 4,000 Units (4,000 Units Intravenous Bolus from Bag 12/25/22 0110)    ED Course/ Medical Decision Making/ A&P                                 Medical Decision Making Amount and/or Complexity of Data Reviewed Labs: ordered. Radiology: ordered. ECG/medicine tests: ordered.  Risk OTC drugs. Prescription drug management. Decision regarding hospitalization.   Patient with new onset pulmonary edema/heart failure.  Also has rising troponins concerning for possible ACS.  Discussed with cardiology and recommended blood gas, heparin.  Agreed with Lasix but suggested a Foley for strict I's and O's.  Recommended hospitalist admission.  Do not see any indication for catheterization or cardiology admit at this time.  Patient placed on couple liters of oxygen for tachypnea and his mild hypoxia of 90 to 93%. Cardiology saw patient and note pending.   Patient still tachypneic and mildly hypoxic even while diuresing. Will start bipap. Son states patient will need some anxiolysis for same, small dose of ativan provided. Pending CTA for admission.   CTA redemonstrates pulmonary edema and bilateral pleural effusions as interpreted by myself.  Radiology read reviewed.  Discussed with hospitalist for admission.   Final Clinical Impression(s) / ED Diagnoses Final diagnoses:  NSTEMI (non-ST elevated myocardial infarction) (HCC)  SOB (shortness of breath)  Dyspnea, unspecified type    Rx / DC Orders ED Discharge Orders     None          Alainna Stawicki, Barbara Cower, MD 12/25/22 2308

## 2022-12-25 NOTE — Consult Note (Signed)
Cardiology Consultation   Patient ID: Allen Torres MRN: [id number]; DOB: 10-03-1940  Admit date: 12/24/2022 Date of Consult: 12/25/2022  PCP:  Laurena Bering, DO   [facility] Providers Cardiologist:  None        Patient Profile:   Allen Torres is a 82 y.o. 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 Clayborne Dana.  History of Present Illness:   Mr. Slayton comes in with his son. He reports SOB for about 1 week that has significantly worsened.  He now endorses orthopnea and mild bilateral lower extremity edema.  He has trouble even completing a sentence without taking a breath.  He denies any recent chest pain or pressure.  No syncope.  No history of heart failure.  He suspected he had pneumonia but has not had a fever.  ECG shows NSR, first-degree AV block, anterior infarct which looks new.  Labs significant for elevated BNP and troponin 19 100-20 100.  His chest x-ray showed interstitial edema with small bilateral pleural effusions.  COVID was negative.  Negative white count.  No fever.  He was tachypneic requiring nasal cannula.  He did get Lasix in the emergency department as well as aspirin and heparin.    Past Medical History:  Diagnosis Date   Hypertension    Renal disorder    Patient states, "chronic kidney disease"    Past Surgical History:  Procedure Laterality Date   BIOPSY  03/16/2020   Procedure: BIOPSY;  Surgeon: Benancio Deeds, MD;  Location: [facility];  Service: Gastroenterology;;   CHOLECYSTECTOMY N/A 03/18/2020   Procedure: LAPAROSCOPIC CHOLECYSTECTOMY;  Surgeon: Luretha Murphy, MD;  Location: [facility];  Service: General;  Laterality: N/A;   ESOPHAGOGASTRODUODENOSCOPY N/A 03/16/2020   Procedure: ESOPHAGOGASTRODUODENOSCOPY (EGD);  Surgeon: Benancio Deeds, MD;  Location: [facility];  Service: Gastroenterology;  Laterality: N/A;    facial cystectomy       Home Medications:  Prior to Admission medications   Medication Sig Start Date End Date Taking? Authorizing Provider  acetaminophen (TYLENOL) 325 MG tablet Take 1-2 tablets (325-650 mg total) by mouth every 4 (four) hours as needed for mild pain. 06/14/21   Setzer, Lynnell Jude, PA-C  allopurinol (ZYLOPRIM) 100 MG tablet Take 100 mg by mouth daily. 06/27/21   [provider]  antiseptic oral rinse (BIOTENE) LIQD 15 mLs by Mouth Rinse route as needed for dry mouth. 06/14/21   Setzer, Lynnell Jude, PA-C  Ascorbic Acid (VITAMIN C) 1000 MG tablet Take 1,000 mg by mouth daily.    [provider]  aspirin EC 81 MG EC tablet Take 1 tablet (81 mg total) by mouth daily. Swallow whole. 06/14/21   Setzer, Lynnell Jude, PA-C  atenolol (TENORMIN) 25 MG tablet Take 1 tablet (25 mg total) by mouth daily. Patient not taking: Reported on 07/02/2021 06/14/21   Milinda Antis, PA-C  atenolol (TENORMIN) 50 MG tablet Take 50 mg by mouth 2 (two) times daily. 06/19/21   [provider]  cholecalciferol (VITAMIN D3) 25 MCG (1000 UNIT) tablet Take 1,000 Units by mouth every other day.    [provider]  doxazosin (CARDURA) 1 MG tablet Take 1 tablet (1 mg total) by mouth every 12 (twelve) hours. Patient not taking: Reported on 07/02/2021 06/14/21   Milinda Antis, PA-C  doxazosin (CARDURA) 1 MG tablet Take 1  tablet (1 mg total) by mouth every 12 (twelve) hours. Patient not taking: Reported on 07/02/2021 06/14/21   Milinda Antis, PA-C  doxazosin (CARDURA) 2 MG tablet Take 2 mg by mouth 2 (two) times daily.    [provider]  fluticasone (FLONASE) 50 MCG/ACT nasal spray Place 1 spray into both nostrils daily as needed for allergies.  02/09/20   [provider]  guaiFENesin-dextromethorphan (ROBITUSSIN DM) 100-10 MG/5ML syrup Take 5-10 mLs by mouth every 6 (six) hours as needed for cough. 06/14/21   Setzer, Lynnell Jude, PA-C  lisinopril (ZESTRIL) 20 MG tablet Take 20 mg by  mouth daily. 06/26/21   [provider]  magnesium gluconate (MAGONATE) 500 MG tablet Take 1 tablet (500 mg total) by mouth at bedtime. 06/14/21   Setzer, Lynnell Jude, PA-C  meclizine (ANTIVERT) 12.5 MG tablet Take 1 tablet (12.5 mg total) by mouth 3 (three) times daily as needed for dizziness. 06/06/21   Lynn Ito, MD  senna (SENOKOT) 8.6 MG TABS tablet Take 1 tablet (8.6 mg total) by mouth 2 (two) times daily. Patient taking differently: Take 1 tablet by mouth daily as needed for mild constipation. 06/14/21   Setzer, Lynnell Jude, PA-C  tamsulosin (FLOMAX) 0.4 MG CAPS capsule Take 2 capsules (0.8 mg total) by mouth daily after supper. 06/14/21   Setzer, Lynnell Jude, PA-C  vitamin B-12 (CYANOCOBALAMIN) 100 MCG tablet Take 100 mcg by mouth daily.    [provider]  zinc gluconate 50 MG tablet Take 50 mg by mouth daily.    [provider]    Inpatient Medications: Scheduled Meds:  heparin  4,000 Units Intravenous Once   Continuous Infusions:  heparin     PRN Meds:   Allergies:    Allergies  Allergen Reactions   Hydrochlorothiazide     Other reaction(s): Other (See Comments) Loose stools   Indomethacin     "messed up my liver" Other reaction(s): Other (See Comments) Patient states that this messed up his liver     Statins Other (See Comments)    Patient states that he hurt so bad, hard to get up in the morning.  Felt like his system was shut down and under able to have a bowel movement    Social History:   Social History   Socioeconomic History   Marital status: Widowed    Spouse name: Not on file   Number of children: Not on file   Years of education: Not on file   Highest education level: Not on file  Occupational History   Not on file  Tobacco Use   Smoking status: Former   Smokeless tobacco: Never  Vaping Use   Vaping status: Never Used  Substance and Sexual Activity   Alcohol use: Never   Drug use: Never   Sexual activity: Not on file  Other  Topics Concern   Not on file  Social History Narrative   Not on file   Social Determinants of Health   Financial Resource Strain: Not on file  Food Insecurity: Not on file  Transportation Needs: Not on file  Physical Activity: Not on file  Stress: Not on file  Social Connections: Not on file  Intimate Partner Violence: Not on file    Family History:    Family History  Problem Relation Age of Onset   Stroke Mother    Cancer Father    Lupus Sister      ROS:  Please see the history of present illness.  All other ROS reviewed and negative.     Physical Exam/Data:   Vitals:   12/24/22 2315 12/24/22 2330 12/24/22 2345 12/25/22 0015  BP: (!) 145/92 (!) 123/106 (!) 143/75 (!) 114/56  Pulse: 83 80 84 77  Resp: (!) 39 (!) 27 (!) 33 (!) 32  Temp:      SpO2: 92% 93% (!) 89% 91%  Weight:      Height:       No intake or output data in the 24 hours ending 12/25/22 0057    12/24/2022    9:08 PM 07/01/2021   10:25 PM 06/07/2021   10:06 AM  Last 3 Weights  Weight (lbs) 208 lb 208 lb 201 lb 11.5 oz  Weight (kg) 94.348 kg 94.348 kg 91.5 kg     Body mass index is 30.72 kg/m.  General:  Well nourished, well developed, tachypnic, looks mildly uncomfortable. HEENT: normal Neck: JVP elevated to mandible Vascular: No carotid bruits; Distal pulses 2+ bilaterally Cardiac:  normal S1, S2; RRR; Soft RUSB SEM Lungs:  bibasilar decreased breath sounds with mid crackles.  Abd: soft, nontender, no hepatomegaly  Ext: Mild BLE edema, warm Musculoskeletal:  No deformities, BUE and BLE strength normal and equal Skin: warm and dry  Neuro:  CNs 2-12 intact, no focal abnormalities noted Psych:  Normal affect   EKG:  The EKG was personally reviewed and demonstrates:  NSR 1st degree AVB, anterior infarct  Relevant CV Studies: ECHO 05/2021  IMPRESSIONS   1. Left ventricular ejection fraction, by estimation, is 60 to 65%. The  left ventricle has normal function. Left ventricular endocardial  border  not optimally defined to evaluate regional wall motion. 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 Left ventricular diastolic parameters  are consistent with Grade I diastolic dysfunction (impaired relaxation).  Elevated left atrial pressure.   2. Right ventricular systolic function is normal. The right ventricular  size is normal. There is normal pulmonary artery systolic pressure.   3. The mitral valve is normal in structure. No evidence of mitral valve  regurgitation. No evidence of mitral stenosis.   4. The aortic valve is tricuspid. Aortic valve regurgitation is not  visualized. Mild to moderate aortic valve stenosis. Aortic valve mean  gradient measures 12.0 mmHg. Aortic valve peak gradient measures 21.3  mmHg. Aortic valve area, by VTI measures 1.08   cm.      Lower than expected gradient due to decreased SVI   5. Aortic dilatation noted. There is mild dilatation of the aortic root,  measuring 41 mm. 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   Laboratory Data:  High Sensitivity Troponin:   Recent Labs  Lab 12/24/22 2136 12/24/22 2327  TROPONINIHS 1,916* 2,106*     Chemistry Recent Labs  Lab 12/24/22 2136  NA 133*  K 4.6  CL 101  CO2 24  GLUCOSE 130*  BUN 22  CREATININE 1.49*  CALCIUM 8.6*  GFRNONAA 47*  ANIONGAP 8    Recent Labs  Lab 12/24/22 2136  PROT 5.9*  ALBUMIN 3.4*  AST 31  ALT 17  ALKPHOS 59  BILITOT 1.1   Lipids No results for input(s): "CHOL", "TRIG", "HDL", "LABVLDL", "LDLCALC", "CHOLHDL" in the last 168 hours.  Hematology Recent Labs  Lab 12/24/22 2136  WBC 7.8  RBC 3.50*  HGB 11.8*  HCT 34.5*  MCV 98.6  MCH 33.7  MCHC 34.2  RDW 13.9  PLT 161  Thyroid No results for input(s): "TSH", "FREET4" in the last 168 hours.  BNP Recent Labs  Lab 12/24/22 2136  BNP 1,659.5*    DDimer No results for input(s): "DDIMER" in the last 168  hours.   Radiology/Studies:  DG Chest 2 View  Result Date: 12/24/2022 CLINICAL DATA:  Dyspnea EXAM: CHEST - 2 VIEW COMPARISON:  None Available. FINDINGS: Lung volumes are small. Diffuse interstitial pulmonary infiltrate is present most in keeping with moderate interstitial pulmonary edema. Small bilateral pleural effusions are present. No pneumothorax. Cardiac size within normal limits. No acute bone abnormality. IMPRESSION: 1. Moderate interstitial pulmonary edema with small bilateral pleural effusions. Electronically Signed   By: Helyn Numbers M.D.   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 Hx of Mod AS, unclear if worse Elevated trop, no chest pain but qs anterior on ECG, Query remote infarct -> new HF AHRF suspected 2/2 to heart failure  I agree with diuresis. Would place foley and check a VBG/ABG given tachypnea and hypoxia. Continue aggressive diuresis with careful monitoring to possibly need bipap. Reasonable to start heparin given degree of trop elevation while rule out PE and ACS. ECHO in the morning. Depending on echo, can decide on LHC once optimize. Monitor on tele   Risk Assessment/Risk Scores:  New York Heart Association (NYHA) Functional Class NYHA Class IV        For questions or updates, please contact [facility] Please consult [email or web address] for contact info under    Signed, Adline Mango, MD  12/25/2022 12:57 AM

## 2022-12-25 NOTE — ED Notes (Signed)
ED TO INPATIENT HANDOFF REPORT  ED Nurse Name and Phone #: [name] [phone]  S Name/Age/Gender Allen Torres 82 y.o. male Room/Bed: 009C/009C  Code Status   Code Status: Limited: Do not attempt resuscitation (DNR) -DNR-LIMITED -Do Not Intubate/DNI   Home/SNF/Other Home Patient oriented to: self, place, time, and situation Is this baseline? Yes   Triage Complete: Triage complete  Chief Complaint Acute CHF (congestive heart failure) (HCC) [I50.9]  Triage Note Patient coming to ED for increasing SHOB.  States he started having chest congestion one week ago. Has tried OTC medications without relief.  No fevers reports.  No edema or pain reported.    Allergies Allergies  Allergen Reactions   Amlodipine Other (See Comments)    Ear ringing worsened   Hydrochlorothiazide Other (See Comments)    Loose stools   Indomethacin Other (See Comments)    "messed up my liver"      Statins Other (See Comments)    Patient states that he hurt so bad, hard to get up in the morning.  Felt like his system was shut down and under able to have a bowel movement    Level of Care/Admitting Diagnosis ED Disposition     ED Disposition  Admit   Condition  --   Comment  Hospital Area: [facility] [[id number]]  Level of Care: Progressive [102]  Admit to Progressive based on following criteria: RESPIRATORY PROBLEMS hypoxemic/hypercapnic respiratory failure that is responsive to NIPPV (BiPAP) or High Flow Nasal Cannula (6-80 lpm). Frequent assessment/intervention, no > Q2 hrs < Q4 hrs, to maintain oxygenation and pulmonary hygiene.  May admit patient to [facility] or [facility] if equivalent level of care is available:: No  Covid Evaluation: Confirmed COVID Negative  Diagnosis: Acute CHF (congestive heart failure) [facility]) [[id number]]  Admitting Physician: Briscoe Deutscher [[id number]]  Attending Physician: Briscoe Deutscher [[id number]]  Certification:: I certify this patient will need  inpatient services for at least 2 midnights  Expected Medical Readiness: 12/28/2022          B Medical/Surgery History Past Medical History:  Diagnosis Date   Hypertension    Renal disorder    Patient states, "chronic kidney disease"   Past Surgical History:  Procedure Laterality Date   BIOPSY  03/16/2020   Procedure: BIOPSY;  Surgeon: Benancio Deeds, MD;  Location: [facility];  Service: Gastroenterology;;   CHOLECYSTECTOMY N/A 03/18/2020   Procedure: LAPAROSCOPIC CHOLECYSTECTOMY;  Surgeon: Luretha Murphy, MD;  Location: [facility];  Service: General;  Laterality: N/A;   ESOPHAGOGASTRODUODENOSCOPY N/A 03/16/2020   Procedure: ESOPHAGOGASTRODUODENOSCOPY (EGD);  Surgeon: Benancio Deeds, MD;  Location: [facility];  Service: Gastroenterology;  Laterality: N/A;   facial cystectomy       A IV Location/Drains/Wounds Patient Lines/Drains/Airways Status     Active Line/Drains/Airways     Name Placement date Placement time Site Days   Peripheral IV 12/24/22 20 G 1" Right Antecubital 12/24/22  2327  Antecubital  1   Peripheral IV 12/25/22 Left Antecubital 12/25/22  0201  Antecubital  less than 1   Incision - 4 Ports Abdomen Right;Lateral Right;Medial Umbilicus Left;Upper 03/18/20  1439  -- 1012            Intake/Output Last 24 hours  Intake/Output Summary (Last 24 hours) at 12/25/2022 1625 Last data filed at 12/25/2022 1441 Gross per 24 hour  Intake 500 ml  Output 300 ml  Net 200 ml    Labs/Imaging Results for orders placed or  performed during the hospital encounter of 12/24/22 (from the past 48 hour(s))  Troponin I (High Sensitivity)     Status: Abnormal   Collection Time: 12/24/22  9:36 PM  Result Value Ref Range   Troponin I (High Sensitivity) 1,916 (HH) <18 ng/L    Comment: CRITICAL RESULT CALLED TO, READ BACK BY AND VERIFIED WITH August Saucer, T RN 2238 12/24/22 AMIREHSANI F (NOTE) Elevated high sensitivity troponin I (hsTnI) values and significant  changes  across serial measurements may suggest ACS but many other  chronic and acute conditions are known to elevate hsTnI results.  Refer to the "Links" section for chest pain algorithms and additional  guidance. Performed at [facility], [address], [place], [place] [address]   CBC with Differential     Status: Abnormal   Collection Time: 12/24/22  9:36 PM  Result Value Ref Range   WBC 7.8 4.0 - 10.5 K/uL   RBC 3.50 (L) 4.22 - 5.81 MIL/uL   Hemoglobin 11.8 (L) 13.0 - 17.0 g/dL   HCT 19.1 (L) 47.8 - 29.5 %   MCV 98.6 80.0 - 100.0 fL   MCH 33.7 26.0 - 34.0 pg   MCHC 34.2 30.0 - 36.0 g/dL   RDW 62.1 30.8 - 65.7 %   Platelets 161 150 - 400 K/uL   nRBC 0.0 0.0 - 0.2 %   Neutrophils Relative % 67 %   Neutro Abs 5.2 1.7 - 7.7 K/uL   Lymphocytes Relative 13 %   Lymphs Abs 1.0 0.7 - 4.0 K/uL   Monocytes Relative 16 %   Monocytes Absolute 1.3 (H) 0.1 - 1.0 K/uL   Eosinophils Relative 4 %   Eosinophils Absolute 0.3 0.0 - 0.5 K/uL   Basophils Relative 0 %   Basophils Absolute 0.0 0.0 - 0.1 K/uL   Immature Granulocytes 0 %   Abs Immature Granulocytes 0.03 0.00 - 0.07 K/uL    Comment: Performed at [facility], [address], [place], [place] [address]  Comprehensive metabolic panel     Status: Abnormal   Collection Time: 12/24/22  9:36 PM  Result Value Ref Range   Sodium 133 (L) 135 - 145 mmol/L   Potassium 4.6 3.5 - 5.1 mmol/L   Chloride 101 98 - 111 mmol/L   CO2 24 22 - 32 mmol/L   Glucose, Bld 130 (H) 70 - 99 mg/dL    Comment: Glucose reference range applies only to samples taken after fasting for at least 8 hours.   BUN 22 8 - 23 mg/dL   Creatinine, Ser 2.95 (H) 0.61 - 1.24 mg/dL   Calcium 8.6 (L) 8.9 - 10.3 mg/dL   Total Protein 5.9 (L) 6.5 - 8.1 g/dL   Albumin 3.4 (L) 3.5 - 5.0 g/dL   AST 31 15 - 41 U/L   ALT 17 0 - 44 U/L   Alkaline Phosphatase 59 38 - 126 U/L   Total Bilirubin 1.1 0.3 - 1.2 mg/dL   GFR, Estimated 47 (L) >60 mL/min    Comment:  (NOTE) Calculated using the CKD-EPI Creatinine Equation (2021)    Anion gap 8 5 - 15    Comment: Performed at [facility], [address], [place], [place] [address]  Brain natriuretic peptide     Status: Abnormal   Collection Time: 12/24/22  9:36 PM  Result Value Ref Range   B Natriuretic Peptide 1,659.5 (H) 0.0 - 100.0 pg/mL    Comment: Performed at [facility],  [address] [place], [place] [address]  SARS Coronavirus 2 by RT PCR (hospital order, performed in [facility] hospital lab) *cepheid single result test* Anterior Nasal Swab     Status: None   Collection Time: 12/24/22  9:46 PM   Specimen: Anterior Nasal Swab  Result Value Ref Range   SARS Coronavirus 2 by RT PCR NEGATIVE NEGATIVE    Comment: Performed at [facility], [address], [place], [place] [address]  Troponin I (High Sensitivity)     Status: Abnormal   Collection Time: 12/24/22 11:27 PM  Result Value Ref Range   Troponin I (High Sensitivity) 2,106 (HH) <18 ng/L    Comment: CRITICAL VALUE NOTED. VALUE IS CONSISTENT WITH PREVIOUSLY REPORTED/CALLED VALUE (NOTE) Elevated high sensitivity troponin I (hsTnI) values and significant  changes across serial measurements may suggest ACS but many other  chronic and acute conditions are known to elevate hsTnI results.  Refer to the "Links" section for chest pain algorithms and additional  guidance. Performed at [facility], [address] [place], [place] [address]   I-Stat venous blood gas, [facility], [facility], [facility])     Status: Abnormal   Collection Time: 12/25/22  2:16 AM  Result Value Ref Range   pH, Ven 7.374 7.25 - 7.43   pCO2, Ven 33.3 (L) 44 - 60 mmHg   pO2, Ven 50 (H) 32 - 45 mmHg   Bicarbonate 19.4 (L) 20.0 - 28.0 mmol/L   TCO2 20 (L) 22 - 32 mmol/L   O2 Saturation 85 %   Acid-base deficit 5.0 (H) 0.0 - 2.0 mmol/L   Sodium 132 (L) 135 - 145 mmol/L   Potassium 4.3 3.5 - 5.1 mmol/L   Calcium, Ion 1.13 (L) 1.15 - 1.40 mmol/L   HCT 31.0  (L) 39.0 - 52.0 %   Hemoglobin 10.5 (L) 13.0 - 17.0 g/dL   Sample type VENOUS   Basic metabolic panel     Status: Abnormal   Collection Time: 12/25/22  6:20 AM  Result Value Ref Range   Sodium 133 (L) 135 - 145 mmol/L   Potassium 4.1 3.5 - 5.1 mmol/L   Chloride 100 98 - 111 mmol/L   CO2 21 (L) 22 - 32 mmol/L   Glucose, Bld 104 (H) 70 - 99 mg/dL    Comment: Glucose reference range applies only to samples taken after fasting for at least 8 hours.   BUN 24 (H) 8 - 23 mg/dL   Creatinine, Ser 8.29 (H) 0.61 - 1.24 mg/dL   Calcium 8.4 (L) 8.9 - 10.3 mg/dL   GFR, Estimated 49 (L) >60 mL/min    Comment: (NOTE) Calculated using the CKD-EPI Creatinine Equation (2021)    Anion gap 12 5 - 15    Comment: Performed at [facility], [address], [place], [place] [address]  CBC     Status: Abnormal   Collection Time: 12/25/22  6:20 AM  Result Value Ref Range   WBC 6.8 4.0 - 10.5 K/uL   RBC 3.12 (L) 4.22 - 5.81 MIL/uL   Hemoglobin 10.2 (L) 13.0 - 17.0 g/dL   HCT 08.6 (L) 57.8 - 46.9 %   MCV 96.8 80.0 - 100.0 fL   MCH 32.7 26.0 - 34.0 pg   MCHC 33.8 30.0 - 36.0 g/dL   RDW 62.9 52.8 - 41.3 %   Platelets 156 150 - 400 K/uL   nRBC 0.0 0.0 - 0.2 %    Comment: Performed at  [facility], [address] [place], [place] [address]  Troponin I (High Sensitivity)     Status: Abnormal   Collection Time: 12/25/22  6:20 AM  Result Value Ref Range   Troponin I (High Sensitivity) 2,387 (HH) <18 ng/L    Comment: CRITICAL VALUE NOTED. VALUE IS CONSISTENT WITH PREVIOUSLY REPORTED/CALLED VALUE (NOTE) Elevated high sensitivity troponin I (hsTnI) values and significant  changes across serial measurements may suggest ACS but many other  chronic and acute conditions are known to elevate hsTnI results.  Refer to the "Links" section for chest pain algorithms and additional  guidance. Performed at [facility], [address], [place], [place] [address]   Heparin level (unfractionated)      Status: Abnormal   Collection Time: 12/25/22  9:04 AM  Result Value Ref Range   Heparin Unfractionated 0.26 (L) 0.30 - 0.70 IU/mL    Comment: (NOTE) The clinical reportable range upper limit is being lowered to >1.10 to align with the FDA approved guidance for the current laboratory assay.  If heparin results are below expected values, and patient dosage has  been confirmed, suggest follow up testing of antithrombin III levels. Performed at [facility], [address], [place], [place] [address]    CT Angio Chest PE W and/or Wo Contrast  Result Date: 12/25/2022 CLINICAL DATA:  High probability for PE.  Shortness of breath. EXAM: CT ANGIOGRAPHY CHEST WITH CONTRAST TECHNIQUE: Multidetector CT imaging of the chest was performed using the standard protocol during bolus administration of intravenous contrast. Multiplanar CT image reconstructions and MIPs were obtained to evaluate the vascular anatomy. RADIATION DOSE REDUCTION: This exam was performed according to the departmental dose-optimization program which includes automated exposure control, adjustment of the mA and/or kV according to patient size and/or use of iterative reconstruction technique. CONTRAST:  75mL OMNIPAQUE IOHEXOL 350 MG/ML SOLN COMPARISON:  None. FINDINGS: Cardiovascular: Heart is enlarged. There is no pericardial effusion. There is aneurysmal dilatation of the ascending aorta measuring 4.7 cm. There are atherosclerotic calcifications of the aorta. There is adequate opacification of the pulmonary arteries the segmental level. There is no evidence for pulmonary embolism. Mediastinum/Nodes: No enlarged mediastinal, hilar, or axillary lymph nodes. Thyroid gland, trachea, and esophagus demonstrate no significant findings. There is a small hiatal hernia. Lungs/Pleura: There are small to moderate-sized bilateral pleural effusions. There is smooth interlobular septal thickening throughout both lungs with patchy airspace and  ground-glass opacities bilaterally in the central predominance, right greater than left. There central peribronchial wall thickening bilaterally. Upper Abdomen: No acute abnormality. Musculoskeletal: No chest wall abnormality. No acute or significant osseous findings. Review of the MIP images confirms the above findings. IMPRESSION: 1. No evidence for pulmonary embolism. 2. Findings compatible with pulmonary edema. 3. Small to moderate-sized bilateral pleural effusions. 4. Small hiatal hernia. 5. Cardiomegaly. 6. 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 Ascending thoracic aortic aneurysm. Recommend semi-annual imaging followup by CTA or MRA and referral to cardiothoracic surgery if not already obtained. This recommendation follows 2010 ACCF/AHA/AATS/ACR/ASA/SCA/SCAI/SIR/STS/SVM Guidelines for the Diagnosis and Management of Patients With Thoracic Aortic Disease. Circulation. 2010; 121: B147-W295. Aortic aneurysm NOS (ICD10-I71.9) Aortic Atherosclerosis (ICD10-I70.0). Electronically Signed   By: Darliss Cheney M.D.   On: 12/25/2022 03:25   DG Chest 2 View  Result Date: 12/24/2022 CLINICAL DATA:  Dyspnea EXAM: CHEST - 2 VIEW COMPARISON:  None Available. FINDINGS: Lung volumes are small. Diffuse interstitial pulmonary infiltrate is present most in keeping with moderate interstitial pulmonary edema. Small  bilateral pleural effusions are present. No pneumothorax. Cardiac size within normal limits. No acute bone abnormality. IMPRESSION: 1. Moderate interstitial pulmonary edema with small bilateral pleural effusions. Electronically Signed   By: Helyn Numbers M.D.   On: 12/24/2022 22:58    Pending Labs Unresulted Labs (From admission, onward)     Start     Ordered   12/26/22 0500  Heparin level (unfractionated)  Daily,   R      12/25/22 0044   12/26/22 0500  CBC  Daily,   R      12/25/22 0044   12/26/22 0500  Magnesium  Tomorrow morning,   R        12/25/22 1029   12/25/22 1930  Heparin level  (unfractionated)  Once-Timed,   TIMED        12/25/22 1048   12/25/22 0909  Heparin level (unfractionated)  ONCE - URGENT,   URGENT        12/25/22 0908   12/25/22 0500  Basic metabolic panel  Daily,   R      12/25/22 0345            Vitals/Pain Today's Vitals   12/25/22 1121 12/25/22 1200 12/25/22 1329 12/25/22 1500  BP: (!) 101/55 113/60  119/78  Pulse: 73 69  69  Resp: (!) 24 (!) 25  (!) 23  Temp:   98.3 F (36.8 C)   TempSrc:   Oral   SpO2: 96% 93%  96%  Weight:      Height:      PainSc:        Isolation Precautions No active isolations  Medications Medications  heparin ADULT infusion 100 units/mL (25000 units/287mL) (1,250 Units/hr Intravenous Rate/Dose Change 12/25/22 1125)  LORazepam (ATIVAN) injection 0.5 mg (0 mg Intravenous Hold 12/25/22 0234)  aspirin EC tablet 81 mg (81 mg Oral Given 12/25/22 1005)  doxazosin (CARDURA) tablet 4 mg (4 mg Oral Given 12/25/22 1121)  sodium chloride flush (NS) 0.9 % injection 3 mL (3 mLs Intravenous Given 12/25/22 0954)  acetaminophen (TYLENOL) tablet 650 mg (has no administration in time range)    Or  acetaminophen (TYLENOL) suppository 650 mg (has no administration in time range)  senna-docusate (Senokot-S) tablet 1 tablet (has no administration in time range)  ondansetron (ZOFRAN) tablet 4 mg (has no administration in time range)    Or  ondansetron (ZOFRAN) injection 4 mg (has no administration in time range)  furosemide (LASIX) injection 40 mg (40 mg Intravenous Given 12/25/22 0733)  furosemide (LASIX) injection 40 mg (40 mg Intravenous Given 12/25/22 0023)  aspirin chewable tablet 324 mg (324 mg Oral Given 12/25/22 0024)  heparin bolus via infusion 4,000 Units (4,000 Units Intravenous Bolus from Bag 12/25/22 0110)  iohexol (OMNIPAQUE) 350 MG/ML injection 75 mL (75 mLs Intravenous Contrast Given 12/25/22 0201)    Mobility walks with person assist now due to sob, monitoring etc. Usually walks at home with no assisstance      Focused Assessments Cardiac Assessment Handoff:    No results found for: "CKTOTAL", "CKMB", "CKMBINDEX", "TROPONINI" No results found for: "DDIMER" Does the Patient currently have chest pain? No   , Pulmonary Assessment Handoff:  Lung sounds: Bilateral Breath Sounds: Fine crackles O2 Device: Nasal Cannula O2 Flow Rate (L/min): 4 L/min  Was previously on bipap    R Recommendations: See Admitting Provider Note  Report given to:   Additional Notes: heparin drip due to elevated trop

## 2022-12-25 NOTE — Progress Notes (Signed)
ANTICOAGULATION CONSULT NOTE - Initial Consult  Pharmacy Consult for Heparin Indication: chest pain/ACS  Allergies  Allergen Reactions   Hydrochlorothiazide     Other reaction(s): Other (See Comments) Loose stools   Indomethacin     "messed up my liver" Other reaction(s): Other (See Comments) Patient states that this messed up his liver     Statins Other (See Comments)    Patient states that he hurt so bad, hard to get up in the morning.  Felt like his system was shut down and under able to have a bowel movement    Patient Measurements: Height: 5\' 9"  (175.3 cm) Weight: 94.3 kg (208 lb) IBW/kg (Calculated) : 70.7 Heparin Dosing Weight: 90 kg  Vital Signs: Temp: 98.4 F (36.9 C) (09/09 2047) BP: 114/56 (09/10 0015) Pulse Rate: 77 (09/10 0015)  Labs: Recent Labs    12/24/22 2136 12/24/22 2327  HGB 11.8*  --   HCT 34.5*  --   PLT 161  --   CREATININE 1.49*  --   TROPONINIHS 1,916* 2,106*    Estimated Creatinine Clearance: 44.1 mL/min (A) (by C-G formula based on SCr of 1.49 mg/dL (H)).   Medical History: Past Medical History:  Diagnosis Date   Hypertension    Renal disorder    Patient states, "chronic kidney disease"    Medications:  No current facility-administered medications on file prior to encounter.   Current Outpatient Medications on File Prior to Encounter  Medication Sig Dispense Refill   acetaminophen (TYLENOL) 325 MG tablet Take 1-2 tablets (325-650 mg total) by mouth every 4 (four) hours as needed for mild pain.     allopurinol (ZYLOPRIM) 100 MG tablet Take 100 mg by mouth daily.     antiseptic oral rinse (BIOTENE) LIQD 15 mLs by Mouth Rinse route as needed for dry mouth. 237 mL 0   Ascorbic Acid (VITAMIN C) 1000 MG tablet Take 1,000 mg by mouth daily.     aspirin EC 81 MG EC tablet Take 1 tablet (81 mg total) by mouth daily. Swallow whole. 30 tablet 0   atenolol (TENORMIN) 25 MG tablet Take 1 tablet (25 mg total) by mouth daily. (Patient not  taking: Reported on 07/02/2021) 30 tablet 0   atenolol (TENORMIN) 50 MG tablet Take 50 mg by mouth 2 (two) times daily.     cholecalciferol (VITAMIN D3) 25 MCG (1000 UNIT) tablet Take 1,000 Units by mouth every other day.     doxazosin (CARDURA) 1 MG tablet Take 1 tablet (1 mg total) by mouth every 12 (twelve) hours. (Patient not taking: Reported on 07/02/2021) 60 tablet 0   doxazosin (CARDURA) 1 MG tablet Take 1 tablet (1 mg total) by mouth every 12 (twelve) hours. (Patient not taking: Reported on 07/02/2021) 60 tablet 0   doxazosin (CARDURA) 2 MG tablet Take 2 mg by mouth 2 (two) times daily.     fluticasone (FLONASE) 50 MCG/ACT nasal spray Place 1 spray into both nostrils daily as needed for allergies.      guaiFENesin-dextromethorphan (ROBITUSSIN DM) 100-10 MG/5ML syrup Take 5-10 mLs by mouth every 6 (six) hours as needed for cough. 118 mL 0   lisinopril (ZESTRIL) 20 MG tablet Take 20 mg by mouth daily.     magnesium gluconate (MAGONATE) 500 MG tablet Take 1 tablet (500 mg total) by mouth at bedtime. 30 tablet 0   meclizine (ANTIVERT) 12.5 MG tablet Take 1 tablet (12.5 mg total) by mouth 3 (three) times daily as needed for dizziness. 30 tablet  0   senna (SENOKOT) 8.6 MG TABS tablet Take 1 tablet (8.6 mg total) by mouth 2 (two) times daily. (Patient taking differently: Take 1 tablet by mouth daily as needed for mild constipation.) 60 tablet 0   tamsulosin (FLOMAX) 0.4 MG CAPS capsule Take 2 capsules (0.8 mg total) by mouth daily after supper. 30 capsule 0   vitamin B-12 (CYANOCOBALAMIN) 100 MCG tablet Take 100 mcg by mouth daily.     zinc gluconate 50 MG tablet Take 50 mg by mouth daily.       Assessment: 82 y.o. male with SOB and elevated troponin for heparin  Goal of Therapy:  Heparin level 0.3-0.7 units/ml Monitor platelets by anticoagulation protocol: Yes   Plan:  Heparin 4000 units IV bolus, then start heparin 1100 units/hr Check heparin level in 8 hours.   Eddie Candle 12/25/2022,12:42 AM

## 2022-12-25 NOTE — Progress Notes (Signed)
   12/25/22 0338  BiPAP/CPAP/SIPAP  $ Non-Invasive Ventilator  Non-Invasive Vent Subsequent  BiPAP/CPAP/SIPAP Pt Type Adult  BiPAP/CPAP/SIPAP V60  Mask Type Full face mask  Mask Size Large  Set Rate 14 breaths/min  Respiratory Rate 22 breaths/min  IPAP 14 cmH20  EPAP 6 cmH2O  FiO2 (%) 30 %  Minute Ventilation 14  Leak 50  Peak Inspiratory Pressure (PIP) 16  Tidal Volume (Vt) 540  Patient Home Equipment No  Auto Titrate No  Press High Alarm 25 cmH2O  Press Low Alarm 5 cmH2O  BiPAP/CPAP /SiPAP Vitals  Pulse Rate 69  Resp (!) 22  SpO2 95 %  MEWS Score/Color  MEWS Score 1  MEWS Score Color Chilton Si

## 2022-12-25 NOTE — Progress Notes (Signed)
   12/25/22 0207  BiPAP/CPAP/SIPAP  $ Non-Invasive Ventilator  Non-Invasive Vent Initial  $ Face Mask Large  Yes  BiPAP/CPAP/SIPAP Pt Type Adult  BiPAP/CPAP/SIPAP V60  Mask Type Full face mask  Mask Size Large  Set Rate 14 breaths/min  Respiratory Rate 24 breaths/min  IPAP 14 cmH20  EPAP 6 cmH2O  FiO2 (%) 30 %  Minute Ventilation 16.6  Leak 36  Peak Inspiratory Pressure (PIP) 15  Tidal Volume (Vt) 610  Patient Home Equipment No  Auto Titrate No  Press High Alarm 25 cmH2O  Press Low Alarm 5 cmH2O  BiPAP/CPAP /SiPAP Vitals  Pulse Rate 83  Resp (!) 24  SpO2 99 %  MEWS Score/Color  MEWS Score 1  MEWS Score Color Chilton Si

## 2022-12-25 NOTE — ED Notes (Signed)
Patient refuses Foley catheter, Dr. Clayborne Dana notified of refusal, no new orders

## 2022-12-25 NOTE — ED Notes (Signed)
Patient to CT.

## 2022-12-25 NOTE — Consult Note (Signed)
ANTICOAGULATION CONSULT NOTE - Consult  Pharmacy Consult for heparin Indication: chest pain/ACS  Allergies  Allergen Reactions   Amlodipine Other (See Comments)    Ear ringing worsened   Hydrochlorothiazide Other (See Comments)    Loose stools   Indomethacin Other (See Comments)    "messed up my liver"      Statins Other (See Comments)    Patient states that he hurt so bad, hard to get up in the morning.  Felt like his system was shut down and under able to have a bowel movement    Patient Measurements: Height: 5\' 9"  (175.3 cm) Weight: 94.3 kg (208 lb) IBW/kg (Calculated) : 70.7 Heparin Dosing Weight: 90 kg  Vital Signs: Temp: 97.8 F (36.6 C) (09/10 0857) Temp Source: Oral (09/10 0857) BP: 131/86 (09/10 0800) Pulse Rate: 76 (09/10 0800)  Labs: Recent Labs    12/24/22 2136 12/24/22 2327 12/25/22 0216 12/25/22 0620 12/25/22 0904  HGB 11.8*  --  10.5* 10.2*  --   HCT 34.5*  --  31.0* 30.2*  --   PLT 161  --   --  156  --   HEPARINUNFRC  --   --   --   --  0.26*  CREATININE 1.49*  --   --  1.43*  --   TROPONINIHS 1,916* 2,106*  --  2,387*  --     Estimated Creatinine Clearance: 45.9 mL/min (A) (by C-G formula based on SCr of 1.43 mg/dL (H)).  Medical History: Past Medical History:  Diagnosis Date   Hypertension    Renal disorder    Patient states, "chronic kidney disease"    Assessment: 82 yo male presents with SOB and elevated troponins.  Pharmacy consulted for heparin dosing for ACS.  Not on anticoagulation prior to admission.  Heparin level 0.26 is subtherapeutic on 1100 units/hr.  Hgb dropped 11.8 > 10.2, platelets stable (156).  No infusion issues or signs of bleeding per RN.  Goal of Therapy:  Heparin level 0.3-0.7 units/ml Monitor platelets by anticoagulation protocol: Yes   Plan:  Increase heparin infusion to 1250 units/hr 8 hour heparin level Daily CBC, heparin level Monitor for s/sx of bleeding  Trixie Rude, PharmD Clinical  Pharmacist 12/25/2022  10:42 AM

## 2022-12-25 NOTE — Progress Notes (Signed)
Patient admitted earlier this morning for acute CHF exacerbation and acute hypoxic respiratory failure. I have reviewed patient's medical records including this morning's H&P, current vitals, labs and medications myself.  Continue IV Lasix.  Follow 2D echo and further cardiology recommendations.  Continue heparin drip.  Repeat a.m. labs.

## 2022-12-25 NOTE — H&P (Signed)
History and Physical    JOHNSTON HOMAN VHQ:[id number] DOB: [birth year] DOA: 12/24/2022  PCP: Laurena Bering, DO   Patient coming from: Home   Chief Complaint: SOB   HPI: EMMET HINH is a 82 y.o. male with medical history significant for hypertension and CKD 3A who presents with shortness of breath.  Patient had been experiencing shortness of breath and mild cough for roughly a week.  He has been worsening progressively, reached the point where he was dyspneic at rest, was having trouble speaking due to shortness of breath, and this prompted his presentation to the ED.  He denies any recent chest discomfort.  He denies fevers.  His son notes that his legs have been swelling.  ED Course: Upon arrival to the ED, patient is found to be afebrile and saturating upper 80s on room air with tachypnea, labored respirations, and stable blood pressure.  EKG demonstrates sinus rhythm with first-degree AV nodal block.  CTA chest is negative for PE but notable for pulmonary edema, small to moderate bilateral pleural effusions, and 4.7 cm ascending thoracic aortic aneurysm.  Labs are most notable for creatinine 1.49, BNP 1660, and troponin 1916.  Cardiology was consulted by the ED physician and the patient was given 324 mg of aspirin, 40 mg IV Lasix, and was started on IV heparin and BiPAP.  Review of Systems:  All other systems reviewed and apart from HPI, are negative.  Past Medical History:  Diagnosis Date   Hypertension    Renal disorder    Patient states, "chronic kidney disease"    Past Surgical History:  Procedure Laterality Date   BIOPSY  03/16/2020   Procedure: BIOPSY;  Surgeon: Benancio Deeds, MD;  Location: [facility];  Service: Gastroenterology;;   CHOLECYSTECTOMY N/A 03/18/2020   Procedure: LAPAROSCOPIC CHOLECYSTECTOMY;  Surgeon: Luretha Murphy, MD;  Location: [facility];  Service: General;  Laterality: N/A;   ESOPHAGOGASTRODUODENOSCOPY N/A 03/16/2020   Procedure:  ESOPHAGOGASTRODUODENOSCOPY (EGD);  Surgeon: Benancio Deeds, MD;  Location: [facility];  Service: Gastroenterology;  Laterality: N/A;   facial cystectomy      Social History:   reports that he has quit smoking. He has never used smokeless tobacco. He reports that he does not drink alcohol and does not use drugs.  Allergies  Allergen Reactions   Hydrochlorothiazide     Other reaction(s): Other (See Comments) Loose stools   Indomethacin     "messed up my liver" Other reaction(s): Other (See Comments) Patient states that this messed up his liver     Statins Other (See Comments)    Patient states that he hurt so bad, hard to get up in the morning.  Felt like his system was shut down and under able to have a bowel movement    Family History  Problem Relation Age of Onset   Stroke Mother    Cancer Father    Lupus Sister      Prior to Admission medications   Medication Sig Start Date End Date Taking? Authorizing Provider  acetaminophen (TYLENOL) 325 MG tablet Take 1-2 tablets (325-650 mg total) by mouth every 4 (four) hours as needed for mild pain. 06/14/21   Setzer, Lynnell Jude, PA-C  allopurinol (ZYLOPRIM) 100 MG tablet Take 100 mg by mouth daily. 06/27/21   [provider]  antiseptic oral rinse (BIOTENE) LIQD 15 mLs by Mouth Rinse route as needed for dry mouth. 06/14/21   Setzer, Lynnell Jude, PA-C  Ascorbic Acid (VITAMIN C) 1000 MG tablet  Take 1,000 mg by mouth daily.    [provider]  aspirin EC 81 MG EC tablet Take 1 tablet (81 mg total) by mouth daily. Swallow whole. 06/14/21   Setzer, Lynnell Jude, PA-C  atenolol (TENORMIN) 25 MG tablet Take 1 tablet (25 mg total) by mouth daily. Patient not taking: Reported on 07/02/2021 06/14/21   Milinda Antis, PA-C  atenolol (TENORMIN) 50 MG tablet Take 50 mg by mouth 2 (two) times daily. 06/19/21   [provider]  cholecalciferol (VITAMIN D3) 25 MCG (1000 UNIT) tablet Take 1,000 Units by mouth every other day.     [provider]  doxazosin (CARDURA) 1 MG tablet Take 1 tablet (1 mg total) by mouth every 12 (twelve) hours. Patient not taking: Reported on 07/02/2021 06/14/21   Milinda Antis, PA-C  doxazosin (CARDURA) 1 MG tablet Take 1 tablet (1 mg total) by mouth every 12 (twelve) hours. Patient not taking: Reported on 07/02/2021 06/14/21   Milinda Antis, PA-C  doxazosin (CARDURA) 2 MG tablet Take 2 mg by mouth 2 (two) times daily.    [provider]  fluticasone (FLONASE) 50 MCG/ACT nasal spray Place 1 spray into both nostrils daily as needed for allergies.  02/09/20   [provider]  guaiFENesin-dextromethorphan (ROBITUSSIN DM) 100-10 MG/5ML syrup Take 5-10 mLs by mouth every 6 (six) hours as needed for cough. 06/14/21   Setzer, Lynnell Jude, PA-C  lisinopril (ZESTRIL) 20 MG tablet Take 20 mg by mouth daily. 06/26/21   [provider]  magnesium gluconate (MAGONATE) 500 MG tablet Take 1 tablet (500 mg total) by mouth at bedtime. 06/14/21   Setzer, Lynnell Jude, PA-C  meclizine (ANTIVERT) 12.5 MG tablet Take 1 tablet (12.5 mg total) by mouth 3 (three) times daily as needed for dizziness. 06/06/21   Lynn Ito, MD  senna (SENOKOT) 8.6 MG TABS tablet Take 1 tablet (8.6 mg total) by mouth 2 (two) times daily. Patient taking differently: Take 1 tablet by mouth daily as needed for mild constipation. 06/14/21   Setzer, Lynnell Jude, PA-C  tamsulosin (FLOMAX) 0.4 MG CAPS capsule Take 2 capsules (0.8 mg total) by mouth daily after supper. 06/14/21   Setzer, Lynnell Jude, PA-C  vitamin B-12 (CYANOCOBALAMIN) 100 MCG tablet Take 100 mcg by mouth daily.    [provider]  zinc gluconate 50 MG tablet Take 50 mg by mouth daily.    [provider]    Physical Exam: Vitals:   12/25/22 0207 12/25/22 0210 12/25/22 0300 12/25/22 0338  BP:  138/69 (!) 106/51   Pulse: 83 81 73 69  Resp: (!) 24   (!) 22  Temp:      TempSrc:      SpO2: 99% 97% 94% 95%  Weight:      Height:         Constitutional: NAD, no pallor or diaphoresis  Eyes: PERTLA, lids and conjunctivae normal ENMT: Mucous membranes are moist. Posterior pharynx clear of any exudate or lesions.   Neck: supple, no masses  Respiratory: Fine rales bilaterally. No wheezing. Labored respirations.   Cardiovascular: S1 & S2 heard, regular rate and rhythm. Pretibial pitting edema bilaterally.  Abdomen: No distension, no tenderness, soft. Bowel sounds active.  Musculoskeletal: no clubbing / cyanosis. No joint deformity upper and lower extremities.   Skin: no significant rashes, lesions, ulcers. Warm, dry, well-perfused. Neurologic: CN 2-12 grossly intact. Moving all extremities. Alert and oriented.  Psychiatric: Pleasant. Cooperative.    Labs and Imaging on  Admission: I have personally reviewed following labs and imaging studies  CBC: Recent Labs  Lab 12/24/22 2136 12/25/22 0216  WBC 7.8  --   NEUTROABS 5.2  --   HGB 11.8* 10.5*  HCT 34.5* 31.0*  MCV 98.6  --   PLT 161  --    Basic Metabolic Panel: Recent Labs  Lab 12/24/22 2136 12/25/22 0216  NA 133* 132*  K 4.6 4.3  CL 101  --   CO2 24  --   GLUCOSE 130*  --   BUN 22  --   CREATININE 1.49*  --   CALCIUM 8.6*  --    GFR: Estimated Creatinine Clearance: 44.1 mL/min (A) (by C-G formula based on SCr of 1.49 mg/dL (H)). Liver Function Tests: Recent Labs  Lab 12/24/22 2136  AST 31  ALT 17  ALKPHOS 59  BILITOT 1.1  PROT 5.9*  ALBUMIN 3.4*   No results for input(s): "LIPASE", "AMYLASE" in the last 168 hours. No results for input(s): "AMMONIA" in the last 168 hours. Coagulation Profile: No results for input(s): "INR", "PROTIME" in the last 168 hours. Cardiac Enzymes: No results for input(s): "CKTOTAL", "CKMB", "CKMBINDEX", "TROPONINI" in the last 168 hours. BNP (last 3 results) No results for input(s): "PROBNP" in the last 8760 hours. HbA1C: No results for input(s): "HGBA1C" in the last 72 hours. CBG: No results for input(s):  "GLUCAP" in the last 168 hours. Lipid Profile: No results for input(s): "CHOL", "HDL", "LDLCALC", "TRIG", "CHOLHDL", "LDLDIRECT" in the last 72 hours. Thyroid Function Tests: No results for input(s): "TSH", "T4TOTAL", "FREET4", "T3FREE", "THYROIDAB" in the last 72 hours. Anemia Panel: No results for input(s): "VITAMINB12", "FOLATE", "FERRITIN", "TIBC", "IRON", "RETICCTPCT" in the last 72 hours. Urine analysis:    Component Value Date/Time   COLORURINE YELLOW 06/01/2021 2145   APPEARANCEUR CLEAR 06/01/2021 2145   LABSPEC 1.011 06/01/2021 2145   PHURINE 5.0 06/01/2021 2145   GLUCOSEU NEGATIVE 06/01/2021 2145   HGBUR NEGATIVE 06/01/2021 2145   BILIRUBINUR NEGATIVE 06/01/2021 2145   KETONESUR NEGATIVE 06/01/2021 2145   PROTEINUR NEGATIVE 06/01/2021 2145   NITRITE NEGATIVE 06/01/2021 2145   LEUKOCYTESUR NEGATIVE 06/01/2021 2145   Sepsis Labs: @LABRCNTIP (procalcitonin:4,lacticidven:4) ) Recent Results (from the past 240 hour(s))  SARS Coronavirus 2 by RT PCR (hospital order, performed in [facility] hospital lab) *cepheid single result test* Anterior Nasal Swab     Status: None   Collection Time: 12/24/22  9:46 PM   Specimen: Anterior Nasal Swab  Result Value Ref Range Status   SARS Coronavirus 2 by RT PCR NEGATIVE NEGATIVE Final    Comment: Performed at [facility], [address], [place], [place] [address]     Radiological Exams on Admission: CT Angio Chest PE W and/or Wo Contrast  Result Date: 12/25/2022 CLINICAL DATA:  High probability for PE.  Shortness of breath. EXAM: CT ANGIOGRAPHY CHEST WITH CONTRAST TECHNIQUE: Multidetector CT imaging of the chest was performed using the standard protocol during bolus administration of intravenous contrast. Multiplanar CT image reconstructions and MIPs were obtained to evaluate the vascular anatomy. RADIATION DOSE REDUCTION: This exam was performed according to the departmental dose-optimization program which includes automated  exposure control, adjustment of the mA and/or kV according to patient size and/or use of iterative reconstruction technique. CONTRAST:  75mL OMNIPAQUE IOHEXOL 350 MG/ML SOLN COMPARISON:  None. FINDINGS: Cardiovascular: Heart is enlarged. There is no pericardial effusion. There is aneurysmal dilatation of the ascending aorta measuring 4.7 cm. There are atherosclerotic calcifications of the aorta.  There is adequate opacification of the pulmonary arteries the segmental level. There is no evidence for pulmonary embolism. Mediastinum/Nodes: No enlarged mediastinal, hilar, or axillary lymph nodes. Thyroid gland, trachea, and esophagus demonstrate no significant findings. There is a small hiatal hernia. Lungs/Pleura: There are small to moderate-sized bilateral pleural effusions. There is smooth interlobular septal thickening throughout both lungs with patchy airspace and ground-glass opacities bilaterally in the central predominance, right greater than left. There central peribronchial wall thickening bilaterally. Upper Abdomen: No acute abnormality. Musculoskeletal: No chest wall abnormality. No acute or significant osseous findings. Review of the MIP images confirms the above findings. IMPRESSION: 1. No evidence for pulmonary embolism. 2. Findings compatible with pulmonary edema. 3. Small to moderate-sized bilateral pleural effusions. 4. Small hiatal hernia. 5. Cardiomegaly. 6. 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 Ascending thoracic aortic aneurysm. Recommend semi-annual imaging followup by CTA or MRA and referral to cardiothoracic surgery if not already obtained. This recommendation follows 2010 ACCF/AHA/AATS/ACR/ASA/SCA/SCAI/SIR/STS/SVM Guidelines for the Diagnosis and Management of Patients With Thoracic Aortic Disease. Circulation. 2010; 121: H086-V784. Aortic aneurysm NOS (ICD10-I71.9) Aortic Atherosclerosis (ICD10-I70.0). Electronically Signed   By: Darliss Cheney M.D.   On: 12/25/2022 03:25   DG Chest 2  View  Result Date: 12/24/2022 CLINICAL DATA:  Dyspnea EXAM: CHEST - 2 VIEW COMPARISON:  None Available. FINDINGS: Lung volumes are small. Diffuse interstitial pulmonary infiltrate is present most in keeping with moderate interstitial pulmonary edema. Small bilateral pleural effusions are present. No pneumothorax. Cardiac size within normal limits. No acute bone abnormality. IMPRESSION: 1. Moderate interstitial pulmonary edema with small bilateral pleural effusions. Electronically Signed   By: Helyn Numbers M.D.   On: 12/24/2022 22:58    EKG: Independently reviewed. Sinus rhythm, 1st degree AV block.   Assessment/Plan   1. Acute CHF; acute hypoxic respiratory failure  - Continue diuresis with IV Lasix, continue BiPAP as needed, monitor weight and I/Os, check echocardiogram    2. Elevated troponin  - Appreciate cardiology consultation  - Patient denies chest discomfort  - Continue IV heparin for now, continue ASA, follow-up echo results   3. CKD 3A  - Appears close to baseline  - Renally-dose medications, monitor closely while diuresing    4. Hx of CVA  - Continue ASA    5. Thoracic aortic aneurysm  - 4.7 cm ascending thoracic aortic aneurysm noted on CT in ED  - Outpatient follow-up recommended     DVT prophylaxis: IV heparin  Code Status: DNR, discussed with patient in presence of his son Level of Care: Level of care: Progressive Family Communication: Son at bedside  Disposition Plan:  Patient is from: Home  Anticipated d/c is to: TBD Anticipated d/c date is: 12/28/22 Patient currently: Pending improved respiratory status, echocardiogram  Consults called: Cardiology  Admission status: Inpatient     Briscoe Deutscher, MD [facility]  12/25/2022, 3:47 AM

## 2022-12-25 NOTE — Progress Notes (Signed)
Cardiology note: Full consultation note from Dr. Piedad Climes reviewed from this morning.  Agree with his assessment and plan.  The patient appears to have new symptoms of heart failure and volume overload with pulmonary edema.  Agree with continued IV diuresis, check 2D echocardiogram, continue IV heparin in the setting of significant troponin elevation, and anticipate cardiac catheterization once his volume status is improved.  Might pursue cardiac catheterization as early as tomorrow.  Okay to feed the patient today as he needs further diuresis before cardiac catheterization is performed.  Allen Torres 12/25/2022 9:10 AM

## 2022-12-26 ENCOUNTER — Other Ambulatory Visit (HOSPITAL_COMMUNITY): Payer: Self-pay

## 2022-12-26 ENCOUNTER — Inpatient Hospital Stay (HOSPITAL_COMMUNITY)
Admission: EM | Disposition: A | Discharge status: Hospice | Payer: Self-pay | Source: Home / Self Care | Attending: Internal Medicine

## 2022-12-26 DIAGNOSIS — N1831 Chronic kidney disease, stage 3a: Secondary | ICD-10-CM | POA: Diagnosis not present

## 2022-12-26 DIAGNOSIS — I509 Heart failure, unspecified: Secondary | ICD-10-CM | POA: Diagnosis not present

## 2022-12-26 DIAGNOSIS — I5021 Acute systolic (congestive) heart failure: Secondary | ICD-10-CM | POA: Diagnosis not present

## 2022-12-26 HISTORY — PX: RIGHT/LEFT HEART CATH AND CORONARY ANGIOGRAPHY: CATH118266

## 2022-12-26 HISTORY — PX: THORACIC AORTOGRAM: CATH118269

## 2022-12-26 LAB — POCT I-STAT EG7
Acid-Base Excess: 0 mmol/L (ref 0.0–2.0)
Acid-Base Excess: 1 mmol/L (ref 0.0–2.0)
Bicarbonate: 24.7 mmol/L (ref 20.0–28.0)
Bicarbonate: 25.1 mmol/L (ref 20.0–28.0)
Calcium, Ion: 1.19 mmol/L (ref 1.15–1.40)
Calcium, Ion: 1.19 mmol/L (ref 1.15–1.40)
HCT: 32 % — ABNORMAL LOW (ref 39.0–52.0)
HCT: 32 % — ABNORMAL LOW (ref 39.0–52.0)
Hemoglobin: 10.9 g/dL — ABNORMAL LOW (ref 13.0–17.0)
Hemoglobin: 10.9 g/dL — ABNORMAL LOW (ref 13.0–17.0)
O2 Saturation: 56 %
O2 Saturation: 60 %
Potassium: 4.2 mmol/L (ref 3.5–5.1)
Potassium: 4.2 mmol/L (ref 3.5–5.1)
Sodium: 136 mmol/L (ref 135–145)
Sodium: 136 mmol/L (ref 135–145)
TCO2: 26 mmol/L (ref 22–32)
TCO2: 26 mmol/L (ref 22–32)
pCO2, Ven: 37.6 mmHg — ABNORMAL LOW (ref 44–60)
pCO2, Ven: 38.1 mmHg — ABNORMAL LOW (ref 44–60)
pH, Ven: 7.42 (ref 7.25–7.43)
pH, Ven: 7.432 — ABNORMAL HIGH (ref 7.25–7.43)
pO2, Ven: 29 mmHg — CL (ref 32–45)
pO2, Ven: 30 mmHg — CL (ref 32–45)

## 2022-12-26 LAB — POCT I-STAT 7, (LYTES, BLD GAS, ICA,H+H)
Acid-base deficit: 1 mmol/L (ref 0.0–2.0)
Bicarbonate: 22.6 mmol/L (ref 20.0–28.0)
Calcium, Ion: 1.19 mmol/L (ref 1.15–1.40)
HCT: 31 % — ABNORMAL LOW (ref 39.0–52.0)
Hemoglobin: 10.5 g/dL — ABNORMAL LOW (ref 13.0–17.0)
O2 Saturation: 93 %
Potassium: 4.2 mmol/L (ref 3.5–5.1)
Sodium: 135 mmol/L (ref 135–145)
TCO2: 24 mmol/L (ref 22–32)
pCO2 arterial: 32.8 mmHg (ref 32–48)
pH, Arterial: 7.446 (ref 7.35–7.45)
pO2, Arterial: 64 mmHg — ABNORMAL LOW (ref 83–108)

## 2022-12-26 LAB — CBC
HCT: 31.4 % — ABNORMAL LOW (ref 39.0–52.0)
HCT: 33.3 % — ABNORMAL LOW (ref 39.0–52.0)
Hemoglobin: 10.6 g/dL — ABNORMAL LOW (ref 13.0–17.0)
Hemoglobin: 11.6 g/dL — ABNORMAL LOW (ref 13.0–17.0)
MCH: 32 pg (ref 26.0–34.0)
MCH: 33.9 pg (ref 26.0–34.0)
MCHC: 33.8 g/dL (ref 30.0–36.0)
MCHC: 34.8 g/dL (ref 30.0–36.0)
MCV: 94.9 fL (ref 80.0–100.0)
MCV: 97.4 fL (ref 80.0–100.0)
Platelets: 170 10*3/uL (ref 150–400)
Platelets: 176 10*3/uL (ref 150–400)
RBC: 3.31 MIL/uL — ABNORMAL LOW (ref 4.22–5.81)
RBC: 3.42 MIL/uL — ABNORMAL LOW (ref 4.22–5.81)
RDW: 13.6 % (ref 11.5–15.5)
RDW: 13.5 % (ref 11.5–15.5)
WBC: 6.4 10*3/uL (ref 4.0–10.5)
WBC: 6.3 10*3/uL (ref 4.0–10.5)
nRBC: 0 % (ref 0.0–0.2)
nRBC: 0 % (ref 0.0–0.2)

## 2022-12-26 LAB — BASIC METABOLIC PANEL
Anion gap: 9 (ref 5–15)
BUN: 24 mg/dL — ABNORMAL HIGH (ref 8–23)
CO2: 24 mmol/L (ref 22–32)
Calcium: 8.5 mg/dL — ABNORMAL LOW (ref 8.9–10.3)
Chloride: 101 mmol/L (ref 98–111)
Creatinine, Ser: 1.44 mg/dL — ABNORMAL HIGH (ref 0.61–1.24)
GFR, Estimated: 49 mL/min — ABNORMAL LOW (ref >60)
Glucose, Bld: 94 mg/dL (ref 70–99)
Potassium: 3.7 mmol/L (ref 3.5–5.1)
Sodium: 134 mmol/L — ABNORMAL LOW (ref 135–145)

## 2022-12-26 LAB — MAGNESIUM: Magnesium: 2 mg/dL (ref 1.7–2.4)

## 2022-12-26 LAB — HEPARIN LEVEL (UNFRACTIONATED): Heparin Unfractionated: 0.34 [IU]/mL (ref 0.30–0.70)

## 2022-12-26 LAB — CREATININE, SERUM
Creatinine, Ser: 1.57 mg/dL — ABNORMAL HIGH (ref 0.61–1.24)
GFR, Estimated: 44 mL/min — ABNORMAL LOW (ref >60)

## 2022-12-26 SURGERY — RIGHT/LEFT HEART CATH AND CORONARY ANGIOGRAPHY
Anesthesia: LOCAL

## 2022-12-26 MED ORDER — HEPARIN SODIUM (PORCINE) 1000 UNIT/ML IJ SOLN
INTRAMUSCULAR | Status: AC
  Filled 2022-12-26: qty 10

## 2022-12-26 MED ORDER — HYDRALAZINE HCL 20 MG/ML IJ SOLN: 10.0000 mg | INTRAMUSCULAR | Status: AC | PRN

## 2022-12-26 MED ORDER — FENTANYL CITRATE (PF) 100 MCG/2ML IJ SOLN
INTRAMUSCULAR | Status: AC
  Filled 2022-12-26: qty 2

## 2022-12-26 MED ORDER — LIDOCAINE HCL (PF) 1 % IJ SOLN
INTRAMUSCULAR | Status: AC
  Filled 2022-12-26: qty 30

## 2022-12-26 MED ORDER — SODIUM CHLORIDE 0.9 % WEIGHT BASED INFUSION
1.0000 mL/kg/h | INTRAVENOUS | Status: AC
  Administered 2022-12-26: 1 mL/kg/h via INTRAVENOUS

## 2022-12-26 MED ORDER — SODIUM CHLORIDE 0.9 % IV SOLN: INTRAVENOUS | Status: DC

## 2022-12-26 MED ORDER — IOHEXOL 350 MG/ML SOLN
INTRAVENOUS | Status: DC | PRN
  Administered 2022-12-26: 18 mL

## 2022-12-26 MED ORDER — VERAPAMIL HCL 2.5 MG/ML IV SOLN
INTRAVENOUS | Status: DC | PRN
  Administered 2022-12-26: 10 mL via INTRA_ARTERIAL

## 2022-12-26 MED ORDER — SODIUM CHLORIDE 0.9% FLUSH
3.0000 mL | Freq: Two times a day (BID) | INTRAVENOUS | Status: DC
  Administered 2022-12-27 – 2022-12-28 (×3): 3 mL via INTRAVENOUS

## 2022-12-26 MED ORDER — VERAPAMIL HCL 2.5 MG/ML IV SOLN
INTRAVENOUS | Status: AC
  Filled 2022-12-26: qty 2

## 2022-12-26 MED ORDER — POTASSIUM CHLORIDE CRYS ER 20 MEQ PO TBCR
40.0000 meq | EXTENDED_RELEASE_TABLET | Freq: Once | ORAL | Status: AC
  Administered 2022-12-26: 40 meq via ORAL
  Filled 2022-12-26: qty 2

## 2022-12-26 MED ORDER — HEPARIN (PORCINE) IN NACL 1000-0.9 UT/500ML-% IV SOLN
INTRAVENOUS | Status: DC | PRN
  Administered 2022-12-26 (×2): 500 mL

## 2022-12-26 MED ORDER — LIDOCAINE HCL (PF) 1 % IJ SOLN
INTRAMUSCULAR | Status: DC | PRN
  Administered 2022-12-26 (×2): 5 mL via INTRADERMAL

## 2022-12-26 MED ORDER — VANCOMYCIN HCL 1500 MG/300ML IV SOLN: 1500.0000 mg | Freq: Once | INTRAVENOUS | Status: DC

## 2022-12-26 MED ORDER — LABETALOL HCL 5 MG/ML IV SOLN: 10.0000 mg | INTRAVENOUS | Status: AC | PRN

## 2022-12-26 MED ORDER — SODIUM CHLORIDE 0.9 % IV SOLN: 250.0000 mL | INTRAVENOUS | Status: DC | PRN

## 2022-12-26 MED ORDER — MIDAZOLAM HCL 2 MG/2ML IJ SOLN
INTRAMUSCULAR | Status: AC
  Filled 2022-12-26: qty 2

## 2022-12-26 MED ORDER — SODIUM CHLORIDE 0.9% FLUSH: 3.0000 mL | INTRAVENOUS | Status: DC | PRN

## 2022-12-26 MED ORDER — ENOXAPARIN SODIUM 40 MG/0.4ML IJ SOSY
40.0000 mg | PREFILLED_SYRINGE | INTRAMUSCULAR | Status: DC
  Administered 2022-12-27 – 2022-12-28 (×2): 40 mg via SUBCUTANEOUS
  Filled 2022-12-26 (×2): qty 0.4

## 2022-12-26 SURGICAL SUPPLY — 19 items
CATH BALLN WEDGE 5F 110CM (CATHETERS) IMPLANT
CATH INFINITI 5FR AL1 (CATHETERS) IMPLANT
CATH INFINITI 5FR ANG PIGTAIL (CATHETERS) IMPLANT
CATH INFINITI 5FR JL4 (CATHETERS) IMPLANT
CATH INFINITI AMBI 5FR TG (CATHETERS) IMPLANT
CATH INFINITI JR4 5F (CATHETERS) IMPLANT
CLOSURE MYNX CONTROL 5F (Vascular Products) IMPLANT
GLIDESHEATH SLEND SS 6F .021 (SHEATH) IMPLANT
GUIDEWIRE INQWIRE 1.5J.035X260 (WIRE) IMPLANT
INQWIRE 1.5J .035X260CM (WIRE) ×1
KIT MICROPUNCTURE NIT STIFF (SHEATH) IMPLANT
PACK CARDIAC CATHETERIZATION (CUSTOM PROCEDURE TRAY) ×1 IMPLANT
SET ATX-X65L (MISCELLANEOUS) IMPLANT
SHEATH GLIDE SLENDER 4/5FR (SHEATH) IMPLANT
SHEATH PINNACLE 5F 10CM (SHEATH) IMPLANT
SHEATH PROBE COVER 6X72 (BAG) IMPLANT
WIRE EMERALD 3MM-J .035X150CM (WIRE) IMPLANT
WIRE EMERALD ST .035X150CM (WIRE) IMPLANT
WIRE HI TORQ VERSACORE-J 145CM (WIRE) IMPLANT

## 2022-12-26 NOTE — Progress Notes (Signed)
PROGRESS NOTE    Allen Torres  WUJ:[id number] DOB: 11-09-1940 DOA: 12/24/2022 PCP: Allen Bering, DO  1/M with history of hypertension, CVA, CKD 3a to the ED with with dyspnea on exertion x1 week -In the ED noted to be hypoxic, last x-ray noted no effusions, CTA chest negative for PE above for pulmonary edema lateral pleural effusions, creat 1.49, BNP 1660 and troponin 1916   Subjective: -Feels better, breathing is improving, wants to go home  Assessment and Plan:  Acute systolic CHF -New diagnosis, echo with EF of 25-30%, normal RV and severe aortic stenosis -Cards consulting, diuresing on IV Lasix, 1.2 L negative -Remains volume overloaded, continue IV Lasix today -Going for right and left heart cath this afternoon, add GDMT over next 1 to 2 days  Severe aortic stenosis -Cath today, anticipate need for TAVR workup   Elevated troponin  -Suspect recent NSTEMI, remains on IV heparin  -Plan for right and left heart cath, continue aspirin    CKD 3A  -Stable, monitor after cath   Hx of CVA  - Continue ASA     Thoracic aortic aneurysm  - 4.7 cm ascending thoracic aortic aneurysm noted on CT in ED  - Outpatient follow-up recommended    Mild chronic anemia -Stable, monitor   DVT prophylaxis: IV heparin Code Status: DNR Family Communication: No family at bedside Disposition Plan: Home likely 2 to 3 days  Consultants:    Procedures:   Antimicrobials:    Objective: Vitals:   12/25/22 2032 12/26/22 0112 12/26/22 0510 12/26/22 0735  BP: 125/67 117/64 (!) 128/58 (!) 147/74  Pulse:  85 81 85  Resp: 18 18 18 20   Temp: 99.5 F (37.5 C) 98 F (36.7 C) 98.5 F (36.9 C) 97.8 F (36.6 C)  TempSrc: Oral Oral Oral Oral  SpO2: 96% 94% 93% 94%  Weight:   95.2 kg   Height:        Intake/Output Summary (Last 24 hours) at 12/26/2022 1200 Last data filed at 12/26/2022 9562 Gross per 24 hour  Intake 726.02 ml  Output 2250 ml  Net -1523.98 ml   Filed Weights    12/24/22 2108 12/25/22 1824 12/26/22 0510  Weight: 94.3 kg 96 kg 95.2 kg    Examination:  General exam: Pleasant elderly male sitting up in bed,  positive JVD CVS: S1-S2, rate loud systolic murmur decreased breath sounds at the bases Abdomen: Soft, obese, nontender, bowel sounds present  Ext: trace edema Neuro:  residual left-sided weakness facial droop Psychiatry:  Mood & affect appropriate.     Data Reviewed:   CBC: Recent Labs  Lab 12/24/22 2136 12/25/22 0216 12/25/22 0620 12/26/22 0332  WBC 7.8  --  6.8 6.4  NEUTROABS 5.2  --   --   --   HGB 11.8* 10.5* 10.2* 10.6*  HCT 34.5* 31.0* 30.2* 31.4*  MCV 98.6  --  96.8 94.9  PLT 161  --  156 170   Basic Metabolic Panel: Recent Labs  Lab 12/24/22 2136 12/25/22 0216 12/25/22 0620 12/26/22 0332  NA 133* 132* 133* 134*  K 4.6 4.3 4.1 3.7  CL 101  --  100 101  CO2 24  --  21* 24  GLUCOSE 130*  --  104* 94  BUN 22  --  24* 24*  CREATININE 1.49*  --  1.43* 1.44*  CALCIUM 8.6*  --  8.4* 8.5*  MG  --   --   --  2.0   GFR: Estimated Creatinine  Clearance: 45 mL/min (A) (by C-G formula based on SCr of 1.44 mg/dL (H)). Liver Function Tests: Recent Labs  Lab 12/24/22 2136  AST 31  ALT 17  ALKPHOS 59  BILITOT 1.1  PROT 5.9*  ALBUMIN 3.4*   No results for input(s): "LIPASE", "AMYLASE" in the last 168 hours. No results for input(s): "AMMONIA" in the last 168 hours. Coagulation Profile: No results for input(s): "INR", "PROTIME" in the last 168 hours. Cardiac Enzymes: No results for input(s): "CKTOTAL", "CKMB", "CKMBINDEX", "TROPONINI" in the last 168 hours. BNP (last 3 results) No results for input(s): "PROBNP" in the last 8760 hours. HbA1C: No results for input(s): "HGBA1C" in the last 72 hours. CBG: No results for input(s): "GLUCAP" in the last 168 hours. Lipid Profile: No results for input(s): "CHOL", "HDL", "LDLCALC", "TRIG", "CHOLHDL", "LDLDIRECT" in the last 72 hours. Thyroid Function Tests: No results  for input(s): "TSH", "T4TOTAL", "FREET4", "T3FREE", "THYROIDAB" in the last 72 hours. Anemia Panel: No results for input(s): "VITAMINB12", "FOLATE", "FERRITIN", "TIBC", "IRON", "RETICCTPCT" in the last 72 hours. Urine analysis:    Component Value Date/Time   COLORURINE YELLOW 06/01/2021 2145   APPEARANCEUR CLEAR 06/01/2021 2145   LABSPEC 1.011 06/01/2021 2145   PHURINE 5.0 06/01/2021 2145   GLUCOSEU NEGATIVE 06/01/2021 2145   HGBUR NEGATIVE 06/01/2021 2145   BILIRUBINUR NEGATIVE 06/01/2021 2145   KETONESUR NEGATIVE 06/01/2021 2145   PROTEINUR NEGATIVE 06/01/2021 2145   NITRITE NEGATIVE 06/01/2021 2145   LEUKOCYTESUR NEGATIVE 06/01/2021 2145   Sepsis Labs: @LABRCNTIP (procalcitonin:4,lacticidven:4)  ) Recent Results (from the past 240 hour(s))  SARS Coronavirus 2 by RT PCR (hospital order, performed in [facility] hospital lab) *cepheid single result test* Anterior Nasal Swab     Status: None   Collection Time: 12/24/22  9:46 PM   Specimen: Anterior Nasal Swab  Result Value Ref Range Status   SARS Coronavirus 2 by RT PCR NEGATIVE NEGATIVE Final    Comment: Performed at [facility], [address], [place], [place] [address]     Radiology Studies: ECHOCARDIOGRAM COMPLETE  Result Date: 12/25/2022    ECHOCARDIOGRAM REPORT   Patient Name:   Allen Torres Date of Exam: 12/25/2022 Medical Rec #:  [id number]     Height:       69.0 in Accession #:    [id number]    Weight:       208.0 lb Date of Birth:  22-Mar-1941    BSA:          2.101 m Patient Age:    81 years      BP:           131/86 mmHg Patient Gender: M             HR:           74 bpm. Exam Location:  Inpatient Procedure: 2D Echo, Color Doppler and Cardiac Doppler Indications:    CHF, Elevated Troponin  History:        Patient has prior history of Echocardiogram examinations, most                 recent 06/03/2021. CHF, Stroke and CKD; Risk Factors:Dyslipidemia                 and Hypertension.  Sonographer:    Milbert Coulter  Referring Phys: [id number] Allen Torres  Sonographer Comments: Image acquisition challenging due to respiratory motion and Image acquisition challenging due to patient body habitus. IMPRESSIONS  1. Left ventricular ejection  fraction, by estimation, is 25 to 30%. The left ventricle has severely decreased function. Left ventricular endocardial border not optimally defined to evaluate regional wall motion. There is moderate concentric left ventricular hypertrophy. Left ventricular diastolic parameters are consistent with Grade II diastolic dysfunction (pseudonormalization).  2. Aortic valve appears bicuspid and is severely calcified. There is severely restricted leaflet mobility. Vmax 2.8 m/s, MG 18 mmHG, AVA 0.95 cm2, DI 0.25. Findings are concerning for severe low flow low gradient aortic stenosis. The aortic valve is bicuspid. There is severe calcifcation of the aortic valve. There is severe thickening of the aortic valve. Aortic valve regurgitation is not visualized. Severe aortic valve stenosis.  3. Right ventricular systolic function is normal. The right ventricular size is mildly enlarged. There is mildly elevated pulmonary artery systolic pressure. The estimated right ventricular systolic pressure is 36.6 mmHg.  4. Left atrial size was severely dilated.  5. The mitral valve is degenerative. Mild mitral valve regurgitation. No evidence of mitral stenosis. Moderate mitral annular calcification.  6. The inferior vena cava is normal in size with greater than 50% respiratory variability, suggesting right atrial pressure of 3 mmHg.  7. Aortic dilatation noted. There is mild dilatation of the aortic root, measuring 41 mm. Comparison(s): Changes from prior study are noted. The left ventricular function is significantly worse. Aortic stenosis appears to be severe low flow low gradient AS. FINDINGS  Left Ventricle: Left ventricular ejection fraction, by estimation, is 25 to 30%. The left ventricle has severely decreased  function. Left ventricular endocardial border not optimally defined to evaluate regional wall motion. The left ventricular internal cavity size was normal in size. There is moderate concentric left ventricular hypertrophy. Left ventricular diastolic parameters are consistent with Grade II diastolic dysfunction (pseudonormalization). Right Ventricle: The right ventricular size is mildly enlarged. No increase in right ventricular wall thickness. Right ventricular systolic function is normal. There is mildly elevated pulmonary artery systolic pressure. The tricuspid regurgitant velocity is 2.90 m/s, and with an assumed right atrial pressure of 3 mmHg, the estimated right ventricular systolic pressure is 36.6 mmHg. Left Atrium: Left atrial size was severely dilated. Right Atrium: Right atrial size was normal in size. Pericardium: There is no evidence of pericardial effusion. Presence of epicardial fat layer. Mitral Valve: The mitral valve is degenerative in appearance. There is mild calcification of the anterior and posterior mitral valve leaflet(s). Moderate mitral annular calcification. Mild mitral valve regurgitation. No evidence of mitral valve stenosis. Tricuspid Valve: The tricuspid valve is grossly normal. Tricuspid valve regurgitation is mild . No evidence of tricuspid stenosis. Aortic Valve: Aortic valve appears bicuspid and is severely calcified. There is severely restricted leaflet mobility. Vmax 2.8 m/s, MG 18 mmHG, AVA 0.95 cm2, DI 0.25. Findings are concerning for severe low flow low gradient aortic stenosis. The aortic valve is bicuspid. There is severe calcifcation of the aortic valve. There is severe thickening of the aortic valve. Aortic valve regurgitation is not visualized. Severe aortic stenosis is present. Aortic valve mean gradient measures 18.0 mmHg. Aortic valve peak gradient measures 32.0 mmHg. Aortic valve area, by VTI measures 0.95 cm. Pulmonic Valve: The pulmonic valve was grossly normal.  Pulmonic valve regurgitation is trivial. No evidence of pulmonic stenosis. Aorta: Aortic dilatation noted. There is mild dilatation of the aortic root, measuring 41 mm. Venous: The inferior vena cava is normal in size with greater than 50% respiratory variability, suggesting right atrial pressure of 3 mmHg. IAS/Shunts: The atrial septum is grossly normal.  LEFT VENTRICLE PLAX  2D LVIDd:         5.00 cm   Diastology LVIDs:         4.50 cm   LV e' medial:    3.70 cm/s LV PW:         1.40 cm   LV E/e' medial:  34.3 LV IVS:        1.40 cm   LV e' lateral:   9.03 cm/s LVOT diam:     2.20 cm   LV E/e' lateral: 14.1 LV SV:         56 LV SV Index:   27 LVOT Area:     3.80 cm  RIGHT VENTRICLE RV S prime:     11.40 cm/s TAPSE (M-mode): 2.4 cm LEFT ATRIUM              Index        RIGHT ATRIUM           Index LA diam:        3.30 cm  1.57 cm/m   RA Area:     18.00 cm LA Vol (A2C):   167.0 ml 79.50 ml/m  RA Volume:   48.80 ml  23.23 ml/m LA Vol (A4C):   76.8 ml  36.56 ml/m LA Biplane Vol: 126.0 ml 59.98 ml/m  AORTIC VALVE AV Area (Vmax):    0.92 cm AV Area (Vmean):   0.92 cm AV Area (VTI):     0.95 cm AV Vmax:           283.00 cm/s AV Vmean:          194.500 cm/s AV VTI:            0.590 m AV Peak Grad:      32.0 mmHg AV Mean Grad:      18.0 mmHg LVOT Vmax:         68.50 cm/s LVOT Vmean:        46.900 cm/s LVOT VTI:          0.147 m LVOT/AV VTI ratio: 0.25  AORTA Ao Root diam: 4.10 cm Ao Asc diam:  3.90 cm MITRAL VALVE                TRICUSPID VALVE MV Area (PHT): 4.36 cm     TR Peak grad:   33.6 mmHg MV Decel Time: 174 msec     TR Vmax:        290.00 cm/s MV E velocity: 127.00 cm/s MV A velocity: 95.20 cm/s   SHUNTS MV E/A ratio:  1.33         Systemic VTI:  0.15 m                             Systemic Diam: 2.20 cm Lennie Odor MD Electronically signed by Lennie Odor MD Signature Date/Time: 12/25/2022/5:07:51 PM    Final    CT Angio Chest PE W and/or Wo Contrast  Result Date: 12/25/2022 CLINICAL DATA:  High  probability for PE.  Shortness of breath. EXAM: CT ANGIOGRAPHY CHEST WITH CONTRAST TECHNIQUE: Multidetector CT imaging of the chest was performed using the standard protocol during bolus administration of intravenous contrast. Multiplanar CT image reconstructions and MIPs were obtained to evaluate the vascular anatomy. RADIATION DOSE REDUCTION: This exam was performed according to the departmental dose-optimization program which includes automated exposure control, adjustment of the mA and/or kV according to patient size and/or use of iterative  reconstruction technique. CONTRAST:  75mL OMNIPAQUE IOHEXOL 350 MG/ML SOLN COMPARISON:  None. FINDINGS: Cardiovascular: Heart is enlarged. There is no pericardial effusion. There is aneurysmal dilatation of the ascending aorta measuring 4.7 cm. There are atherosclerotic calcifications of the aorta. There is adequate opacification of the pulmonary arteries the segmental level. There is no evidence for pulmonary embolism. Mediastinum/Nodes: No enlarged mediastinal, hilar, or axillary lymph nodes. Thyroid gland, trachea, and esophagus demonstrate no significant findings. There is a small hiatal hernia. Lungs/Pleura: There are small to moderate-sized bilateral pleural effusions. There is smooth interlobular septal thickening throughout both lungs with patchy airspace and ground-glass opacities bilaterally in the central predominance, right greater than left. There central peribronchial wall thickening bilaterally. Upper Abdomen: No acute abnormality. Musculoskeletal: No chest wall abnormality. No acute or significant osseous findings. Review of the MIP images confirms the above findings. IMPRESSION: 1. No evidence for pulmonary embolism. 2. Findings compatible with pulmonary edema. 3. Small to moderate-sized bilateral pleural effusions. 4. Small hiatal hernia. 5. Cardiomegaly. 6. 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 Ascending thoracic aortic aneurysm. Recommend semi-annual  imaging followup by CTA or MRA and referral to cardiothoracic surgery if not already obtained. This recommendation follows 2010 ACCF/AHA/AATS/ACR/ASA/SCA/SCAI/SIR/STS/SVM Guidelines for the Diagnosis and Management of Patients With Thoracic Aortic Disease. Circulation. 2010; 121: Z610-R604. Aortic aneurysm NOS (ICD10-I71.9) Aortic Atherosclerosis (ICD10-I70.0). Electronically Signed   By: Darliss Cheney M.D.   On: 12/25/2022 03:25   DG Chest 2 View  Result Date: 12/24/2022 CLINICAL DATA:  Dyspnea EXAM: CHEST - 2 VIEW COMPARISON:  None Available. FINDINGS: Lung volumes are small. Diffuse interstitial pulmonary infiltrate is present most in keeping with moderate interstitial pulmonary edema. Small bilateral pleural effusions are present. No pneumothorax. Cardiac size within normal limits. No acute bone abnormality. IMPRESSION: 1. Moderate interstitial pulmonary edema with small bilateral pleural effusions. Electronically Signed   By: Helyn Numbers M.D.   On: 12/24/2022 22:58     Scheduled Meds:  aspirin EC  81 mg Oral Daily   doxazosin  4 mg Oral BID   furosemide  40 mg Intravenous BID   LORazepam  0.5 mg Intravenous Once   sodium chloride flush  3 mL Intravenous Q12H   Continuous Infusions:  heparin 1,250 Units/hr (12/25/22 1943)     LOS: 1 day    Time spent:    Zannie Cove, MD [facility]   12/26/2022, 12:00 PM

## 2022-12-26 NOTE — Consult Note (Signed)
ANTICOAGULATION CONSULT NOTE - Consult  Pharmacy Consult for heparin Indication: chest pain/ACS  Allergies  Allergen Reactions   Amlodipine Other (See Comments)    Ear ringing worsened   Hydrochlorothiazide Other (See Comments)    Loose stools   Indomethacin Other (See Comments)    "messed up my liver"      Statins Other (See Comments)    Patient states that he hurt so bad, hard to get up in the morning.  Felt like his system was shut down and under able to have a bowel movement    Patient Measurements: Height: 5\' 8"  (172.7 cm) Weight: 95.2 kg (209 lb 14.1 oz) IBW/kg (Calculated) : 68.4 Heparin Dosing Weight: 90 kg  Vital Signs: Temp: 98.5 F (36.9 C) (09/11 0510) Temp Source: Oral (09/11 0510) BP: 128/58 (09/11 0510) Pulse Rate: 81 (09/11 0510)  Labs: Recent Labs    12/24/22 2136 12/24/22 2327 12/25/22 0216 12/25/22 0620 12/25/22 0904 12/25/22 2045 12/26/22 0332  HGB 11.8*  --  10.5* 10.2*  --   --  10.6*  HCT 34.5*  --  31.0* 30.2*  --   --  31.4*  PLT 161  --   --  156  --   --  170  HEPARINUNFRC  --   --   --   --  0.26* 0.35 0.34  CREATININE 1.49*  --   --  1.43*  --   --  1.44*  TROPONINIHS 1,916* 2,106*  --  2,387*  --   --   --     Estimated Creatinine Clearance: 45 mL/min (A) (by C-G formula based on SCr of 1.44 mg/dL (H)).  Medical History: Past Medical History:  Diagnosis Date   Hypertension    Renal disorder    Patient states, "chronic kidney disease"    Assessment: 82 yo male presents with SOB and elevated troponins.  Pharmacy consulted for heparin dosing for ACS.  Not on anticoagulation prior to admission.  Heparin level 0.34 is therapeutic on 1250 units/hr.  CBC stable - Hgb 10.6, platelets 170.  No infusion issues noted.  Goal of Therapy:  Heparin level 0.3-0.7 units/ml Monitor platelets by anticoagulation protocol: Yes   Plan:  Continue heparin infusion 1250 units/hr Daily CBC, heparin level Monitor for s/sx of bleeding  Trixie Rude, PharmD Clinical Pharmacist 12/26/2022  7:32 AM

## 2022-12-26 NOTE — Plan of Care (Signed)
  Problem: Skin Integrity: Goal: Risk for impaired skin integrity will decrease Outcome: Progressing   Problem: Education: Goal: Ability to demonstrate management of disease process will improve Outcome: Not Progressing Goal: Ability to verbalize understanding of medication therapies will improve Outcome: Not Progressing   Problem: Activity: Goal: Capacity to carry out activities will improve Outcome: Not Progressing   Problem: Education: Goal: Knowledge of General Education information will improve Description: Including pain rating scale, medication(s)/side effects and non-pharmacologic comfort measures Outcome: Not Progressing   Problem: Health Behavior/Discharge Planning: Goal: Ability to manage health-related needs will improve Outcome: Not Progressing   Problem: Clinical Measurements: Goal: Respiratory complications will improve Outcome: Not Progressing   Problem: Activity: Goal: Risk for activity intolerance will decrease Outcome: Not Progressing   Problem: Coping: Goal: Level of anxiety will decrease Outcome: Not Progressing   Problem: Elimination: Goal: Will not experience complications related to urinary retention Outcome: Not Progressing

## 2022-12-26 NOTE — Progress Notes (Signed)
   12/26/22 2300  BiPAP/CPAP/SIPAP  Reason BIPAP/CPAP not in use Non-compliant (pt states he wont be able to sleep comfortably. VS are stable RT will monitor)

## 2022-12-26 NOTE — Progress Notes (Signed)
   Patient Name: Allen Torres Date of Encounter: 12/26/2022 [facility] Cardiologist: None   Interval Summary  .    Breathing better today.  Did not sleep very well.  Eager to get home to his own bed.  No chest pain.  Vital Signs .    Vitals:   12/25/22 2032 12/26/22 0112 12/26/22 0510 12/26/22 0735  BP: 125/67 117/64 (!) 128/58 (!) 147/74  Pulse:  85 81 85  Resp: 18 18 18 20   Temp: 99.5 F (37.5 C) 98 F (36.7 C) 98.5 F (36.9 C) 97.8 F (36.6 C)  TempSrc: Oral Oral Oral Oral  SpO2: 96% 94% 93% 94%  Weight:   95.2 kg   Height:        Intake/Output Summary (Last 24 hours) at 12/26/2022 0850 Last data filed at 12/26/2022 6578 Gross per 24 hour  Intake 726.02 ml  Output 2250 ml  Net -1523.98 ml      12/26/2022    5:10 AM 12/25/2022    6:24 PM 12/24/2022    9:08 PM  Last 3 Weights  Weight (lbs) 209 lb 14.1 oz 211 lb 10.3 oz 208 lb  Weight (kg) 95.2 kg 96 kg 94.348 kg      Telemetry/ECG    Sinus rhythm with no significant arrhythmia - Personally Reviewed  Physical Exam .   GEN: No acute distress.   Neck: JVP moderately elevated Cardiac: RRR, 2/6 midsystolic murmur at the right upper sternal border, no diastolic murmur Respiratory: Clear to auscultation bilaterally. GI: Soft, nontender, non-distended  MS: No edema  Assessment & Plan .     1.  Acute heart failure with reduced ejection fraction: Patient volume overloaded on presentation.  Diuresing with IV Lasix.  Leg edema has resolved.  JVP remains moderately elevated.  Echo reviewed with newly diagnosed severe LV dysfunction, LVEF less than 30% and probable severe low-flow low gradient aortic stenosis.  Patient needs right and left heart catheterization for further assessment of his hemodynamics, transaortic valve gradient, and coronary angiography to evaluate for ischemic heart disease. I have reviewed the risks, indications, and alternatives to cardiac catheterization, possible angioplasty, and stenting  with the patient. Risks include but are not limited to bleeding, infection, vascular injury, stroke, myocardial infection, arrhythmia, kidney injury, radiation-related injury in the case of prolonged fluoroscopy use, emergency cardiac surgery, and death. The patient understands the risks of serious complication is 1-2 in 1000 with diagnostic cardiac cath and 1-2% or less with angioplasty/stenting.   2.  Possible severe low-flow low gradient aortic stenosis: Plan as above with cardiac catheterization for further assessment. Will ask that aortic valve is crossed for gradients to assess for LFLG AS.  Likely will need CT angiography studies as well. 3.  Elevated high-sensitivity troponin peak 2387, fairly flat trend, suspect demand ischemia in the setting of heart failure and aortic stenosis.  Patient with no angina.as above, coronary angiography today.  4.  Chronic kidney disease stage IIIa.  GFR estimated 47-49.  Avoid nephrotoxins.  Cautious diuresis in the setting of newly diagnosed heart failure.  Dispo: R/L heart catheterization today, continue diuresis, begin GDMT post-cath pending hemodynamic assessment.   For questions or updates, please contact [facility] Please consult [email or web address] for contact info under        Signed, Tonny Bollman, MD

## 2022-12-26 NOTE — Progress Notes (Signed)
Pt continues to refuse heart cath, cardiology and hospitalist have both been notified. Also, RN told cath lab RN as well

## 2022-12-26 NOTE — TOC Benefit Eligibility Note (Signed)
Patient Product/process development scientist completed.    The patient is insured through [employer]. Patient has Medicare and is not eligible for a copay card, but may be able to apply for patient assistance, if available.    Ran test claim for Farxiga 10 mg and the current 30 day co-pay is $95.00.  Ran test claim for Jardiance 10 mg and the current 30 day co-pay is $45.00.  Ran test claim for Entresto 24-26 mg and the current 30 day co-pay is $45.00.    This test claim was processed through [facility]- copay amounts may vary at other pharmacies due to pharmacy/plan contracts, or as the patient moves through the different stages of their insurance plan.     Roland Earl, CPHT Pharmacy Technician III Certified Patient Advocate [facility] Pharmacy Patient Advocate Team Direct Number: [phone]  Fax: [phone]

## 2022-12-26 NOTE — Brief Op Note (Signed)
12/26/2022  4:56 PM  PATIENT:  Allen Torres  82 y.o. male referred for right heart catheterization after presenting with heart failure symptoms found to have reduced EF, likely severe aortic stenosis and troponin levels of over 2000.  He is being referred for right left heart catheterization as part of a possible prevalvular replacement procedure.  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  Aneurysmal dilation seen with thoracic aortography. Procedure aborted, although catheters were placed to attempt angiography, I was not able to engage either the right or left coronary artery, nor was able to cross the aortic valve via the radial access.  Was not able to reach the aortic root via femoral access.  PROCEDURE:  Procedure(s): RIGHT/LEFT HEART CATH AND CORONARY ANGIOGRAPHY (N/A) THORACIC AORTOGRAM (N/A)  SURGEON:  Surgeons and Role:    * Marykay Lex, MD - Primary   ASSISTANTS: Dr. Excell Seltzer who assisted with Mynx closure device and decision making for catheter placement   Time Out: Verified patient identification, verified procedure, site/side was marked, verified correct patient position, special equipment/implants available, medications/allergies/relevent history reviewed, required imaging and test results available. Performed.  Access:  * Right Brachial Vein: 5Fr  sheath placed via direct ultrasound guidance, modified Seldinger technique * RIGHT Radial Artery: 6 Fr sheath -- Seldinger technique using Micropuncture Kit Direct ultrasound guidance used.  Permanent image obtained and placed on chart. 10 mL radial cocktail IA;   *Right common Femoral Artery: 5 Fr  Sheath - fluoroscopically guided modified Seldinger Technique    Right Heart Catheterization: 5 Fr Swan Ganz catheter advanced under fluoroscopy with balloon inflated to the RA, RV, then PCWP-PA for hemodynamic measurement.  * Simultaneous FA & PA blood gases checked for SaO2% to calculate FICK CO/CI   * Catheter removed completely out of the body with balloon deflated.  Catheter Placement for Coronary Angiography: Via the right radial access, over combination of long exchange J-wire and versa core wire I was able to advance the catheter through the extremely tortuous innominate artery system to the aortic knob and down to the aortic root.  Upon arriving at the aortic root, I was unable to torque the catheter even with wire in place.  I therefore attempted to cross the aortic valve with both standard wire and straight wire.  Was able to advance the wire slightly across the valve, but the catheter would not advance to the valve itself and therefore pushed the wire back out.  At that point I decided to abort radial access with likely futility.  Attention was turned to the femoral access.  However while advancing the wire over the catheter into the thoracic aorta, the catheter folded upon itself and what appeared to be an aneurysmal segment.  Using a JR4 catheter I was able to advance a wire into what appeared to be the left subclavian artery, but I was not able to angle the catheter toward the ascending aorta.  The catheter then pulled back into the thoracic aorta and I aortic angiogram was performed revealing extreme tortuosity with aneurysmal dilation of the descending thoracic aorta that seems to fold up on itself.  There was a pretty significant blood pressure gradient between the femoral artery/aorta and the subclavian artery  from the radial access. Several attempts with different wire and catheter were made to try to advance into the aortic root and I was not able to get there.  I decided to then abort  the procedure.  Upon completion of Angiogaphy, the catheter was removed completely out of the body over a wire, without complication.  Femoral / Brachial Sheath(s) removed in the Cath Lab with Mynx closure device used for femoral arterial access, and manual pressure for hemostasis of the brachial sheath..    Radial sheath removed in the Cardiac Catheterization lab with TR Band placed for hemostasis.  TR Band: 1615  Hours; 14 mL air reverse Barbeau C  MEDICATIONS * SQ Lidocaine 3mL * Radial Cocktail: 3 mg Verapmil in 10 mL NS * Isovue Contrast: Less than 20 mL 25 mcg IV fentanyl.  ANESTHESIA: 3 mL SQ lidocaine for radial access, 2 mL for brachial venous access, 10 mL for femoral arterial access.  25 mg and IV fentanyl given for pain  EBL:  < 50 mL    DICTATION: .Note written in EPIC  PLAN OF CARE:  Patient will return to his nursing unit.  The plan will be to gently hydrate and perform CT angiogram of the aorta and coronary arteries in order to fully assess both.  He does not seem to be a very stable candidate for femoral access TAVR.  -> IV heparin plan to restart 6 hours after Mynx.  PATIENT DISPOSITION:  PACU - hemodynamically stable.    Bryan Lemma, MD

## 2022-12-26 NOTE — TOC Initial Note (Signed)
Transition of Care [facility]) - Initial/Assessment Note    Patient Details  Name: Allen Torres MRN: [id number] Date of Birth: [birth year]  [facility]) CM/SW Contact:    Leone Haven, RN Phone Number: 12/26/2022, 3:49 PM  Clinical Narrative:                 From home with daughter , has PCP and insurance on file, states has no HH services in place at this time , he has walker and cane at home (do not use) .  States daughter will transport him home at [employer] and family is support system, states gets medications from [facility].   Pta self ambulatory   Expected Discharge Plan: Home/Self Care Barriers to Discharge: Continued Medical Work up   Patient Goals and CMS Choice Patient states their goals for this hospitalization and ongoing recovery are:: return home   Choice offered to / list presented to : NA      Expected Discharge Plan and Services In-house Referral: NA Discharge Planning Services: CM Consult Post Acute Care Choice: NA Living arrangements for the past 2 months: Single Family Home                 DME Arranged: N/A DME Agency: NA       HH Arranged: NA          Prior Living Arrangements/Services Living arrangements for the past 2 months: Single Family Home Lives with:: Adult Children (daughter) Patient language and need for interpreter reviewed:: Yes Do you feel safe going back to the place where you live?: Yes      Need for Family Participation in Patient Care: Yes (Comment) Care giver support system in place?: Yes (comment) Current home services: DME (walker, cane) Criminal Activity/Legal Involvement Pertinent to Current Situation/Hospitalization: No - Comment as needed  Activities of Daily Living Home Assistive Devices/Equipment: None ADL Screening (condition at time of admission) Patient's cognitive ability adequate to safely complete daily activities?: Yes Is the patient deaf or have difficulty hearing?: Yes Does the patient have  difficulty seeing, even when wearing glasses/contacts?: Yes Does the patient have difficulty concentrating, remembering, or making decisions?: No Patient able to express need for assistance with ADLs?: Yes Does the patient have difficulty dressing or bathing?: Yes Independently performs ADLs?: Yes (appropriate for developmental age) Does the patient have difficulty walking or climbing stairs?: Yes Weakness of Legs: Both Weakness of Arms/Hands: None  Permission Sought/Granted Permission sought to share information with : Case Manager Permission granted to share information with : Yes, Verbal Permission Granted              Emotional Assessment Appearance:: Appears stated age Attitude/Demeanor/Rapport: Engaged Affect (typically observed): Appropriate Orientation: : Oriented to Self, Oriented to Place, Oriented to  Time, Oriented to Situation Alcohol / Substance Use: Not Applicable Psych Involvement: No (comment)  Admission diagnosis:  SOB (shortness of breath) [R06.02] NSTEMI (non-ST elevated myocardial infarction) (HCC) [I21.4] Acute CHF (congestive heart failure) (HCC) [I50.9] Dyspnea, unspecified type [R06.00] Patient Active Problem List   Diagnosis Date Noted   Acute CHF (congestive heart failure) (HCC) 12/25/2022   Thoracic ascending aortic aneurysm (HCC) 12/25/2022   Acute HFrEF (heart failure with reduced ejection fraction) (HCC) 12/25/2022   Acute embolic stroke (HCC) 06/07/2021   History of stroke 06/01/2021   Facial droop due to acute stroke (HCC) 06/01/2021   Dysarthria due to acute stroke (HCC) 06/01/2021   DNR (do not resuscitate)/DNI(Do not intubate) 06/01/2021  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 03/18/2020   Abnormal CT scan    Hyponatremia 03/14/2020   Essential hypertension 03/14/2020   Stage 3a chronic kidney disease (HCC) 05/06/2015   Mixed hyperlipidemia 05/06/2015   PCP:  Laurena Bering, DO Pharmacy:   [facility] - [place], [place] - [address] [place] [address] Phone: [phone] Fax: [phone]  [facility] - [place], [place] - [address] [place] [address] Phone: [phone] Fax: [phone]  [facility] [address] [place] [address] Phone: [phone] Fax: [phone]     Social Determinants of Health (SDOH) Social History: SDOH Screenings   Food Insecurity: No Food Insecurity (12/25/2022)  Housing: Low Risk  (12/25/2022)  Transportation Needs: No Transportation Needs (12/25/2022)  Utilities: Not At Risk (12/25/2022)  Tobacco Use: Medium Risk (12/25/2022)   SDOH Interventions:     Readmission Risk Interventions     No data to display

## 2022-12-26 NOTE — H&P (View-Only) (Signed)
   Patient Name: Allen Torres Date of Encounter: 12/26/2022 [facility] Cardiologist: None   Interval Summary  .    Breathing better today.  Did not sleep very well.  Eager to get home to his own bed.  No chest pain.  Vital Signs .    Vitals:   12/25/22 2032 12/26/22 0112 12/26/22 0510 12/26/22 0735  BP: 125/67 117/64 (!) 128/58 (!) 147/74  Pulse:  85 81 85  Resp: 18 18 18 20   Temp: 99.5 F (37.5 C) 98 F (36.7 C) 98.5 F (36.9 C) 97.8 F (36.6 C)  TempSrc: Oral Oral Oral Oral  SpO2: 96% 94% 93% 94%  Weight:   95.2 kg   Height:        Intake/Output Summary (Last 24 hours) at 12/26/2022 0850 Last data filed at 12/26/2022 6578 Gross per 24 hour  Intake 726.02 ml  Output 2250 ml  Net -1523.98 ml      12/26/2022    5:10 AM 12/25/2022    6:24 PM 12/24/2022    9:08 PM  Last 3 Weights  Weight (lbs) 209 lb 14.1 oz 211 lb 10.3 oz 208 lb  Weight (kg) 95.2 kg 96 kg 94.348 kg      Telemetry/ECG    Sinus rhythm with no significant arrhythmia - Personally Reviewed  Physical Exam .   GEN: No acute distress.   Neck: JVP moderately elevated Cardiac: RRR, 2/6 midsystolic murmur at the right upper sternal border, no diastolic murmur Respiratory: Clear to auscultation bilaterally. GI: Soft, nontender, non-distended  MS: No edema  Assessment & Plan .     1.  Acute heart failure with reduced ejection fraction: Patient volume overloaded on presentation.  Diuresing with IV Lasix.  Leg edema has resolved.  JVP remains moderately elevated.  Echo reviewed with newly diagnosed severe LV dysfunction, LVEF less than 30% and probable severe low-flow low gradient aortic stenosis.  Patient needs right and left heart catheterization for further assessment of his hemodynamics, transaortic valve gradient, and coronary angiography to evaluate for ischemic heart disease. I have reviewed the risks, indications, and alternatives to cardiac catheterization, possible angioplasty, and stenting  with the patient. Risks include but are not limited to bleeding, infection, vascular injury, stroke, myocardial infection, arrhythmia, kidney injury, radiation-related injury in the case of prolonged fluoroscopy use, emergency cardiac surgery, and death. The patient understands the risks of serious complication is 1-2 in 1000 with diagnostic cardiac cath and 1-2% or less with angioplasty/stenting.   2.  Possible severe low-flow low gradient aortic stenosis: Plan as above with cardiac catheterization for further assessment. Will ask that aortic valve is crossed for gradients to assess for LFLG AS.  Likely will need CT angiography studies as well. 3.  Elevated high-sensitivity troponin peak 2387, fairly flat trend, suspect demand ischemia in the setting of heart failure and aortic stenosis.  Patient with no angina.as above, coronary angiography today.  4.  Chronic kidney disease stage IIIa.  GFR estimated 47-49.  Avoid nephrotoxins.  Cautious diuresis in the setting of newly diagnosed heart failure.  Dispo: R/L heart catheterization today, continue diuresis, begin GDMT post-cath pending hemodynamic assessment.   For questions or updates, please contact [facility] Please consult [email or web address] for contact info under        Signed, Tonny Bollman, MD

## 2022-12-26 NOTE — Interval H&P Note (Signed)
History and Physical Interval Note:  12/26/2022 2:51 PM  Allen Torres  has presented today for surgery, with the diagnosis of heart failure.  The various methods of treatment have been discussed with the patient and family. After consideration of risks, benefits and other options for treatment, the patient has consented to  Procedure(s): RIGHT/LEFT HEART CATH AND CORONARY ANGIOGRAPHY (N/A)  PERCUTANEOUS CORONARY INTERVENTION  as a surgical intervention.  The patient's history has been reviewed, patient examined, no change in status, stable for surgery.  I have reviewed the patient's chart and labs.  Questions were answered to the patient's satisfaction.    Cath Lab Visit (complete for each Cath Lab visit)  Clinical Evaluation Leading to the Procedure:   ACS: Yes.    Non-ACS:    Anginal Classification: No Symptoms => presented with heart failure symptoms NYHA class III  Anti-ischemic medical therapy: Minimal Therapy (1 class of medications)  Non-Invasive Test Results: Equivocal test results high risk findings of severely reduced EF, aortic stenosis and elevated troponin  Prior CABG: No previous CABG    Bryan Lemma

## 2022-12-27 ENCOUNTER — Inpatient Hospital Stay (HOSPITAL_COMMUNITY): Payer: Medicare HMO

## 2022-12-27 DIAGNOSIS — I350 Nonrheumatic aortic (valve) stenosis: Secondary | ICD-10-CM | POA: Diagnosis not present

## 2022-12-27 DIAGNOSIS — I5021 Acute systolic (congestive) heart failure: Secondary | ICD-10-CM | POA: Diagnosis not present

## 2022-12-27 DIAGNOSIS — I509 Heart failure, unspecified: Secondary | ICD-10-CM | POA: Diagnosis not present

## 2022-12-27 LAB — LIPID PANEL
Cholesterol: 231 mg/dL — ABNORMAL HIGH (ref 0–200)
HDL: 34 mg/dL — ABNORMAL LOW (ref >40)
LDL Cholesterol: 167 mg/dL — ABNORMAL HIGH (ref 0–99)
Total CHOL/HDL Ratio: 6.8 ratio
Triglycerides: 148 mg/dL (ref <150)
VLDL: 30 mg/dL (ref 0–40)

## 2022-12-27 LAB — BASIC METABOLIC PANEL
Anion gap: 13 (ref 5–15)
BUN: 29 mg/dL — ABNORMAL HIGH (ref 8–23)
CO2: 21 mmol/L — ABNORMAL LOW (ref 22–32)
Calcium: 8.9 mg/dL (ref 8.9–10.3)
Chloride: 101 mmol/L (ref 98–111)
Creatinine, Ser: 1.68 mg/dL — ABNORMAL HIGH (ref 0.61–1.24)
GFR, Estimated: 41 mL/min — ABNORMAL LOW (ref >60)
Glucose, Bld: 91 mg/dL (ref 70–99)
Potassium: 4 mmol/L (ref 3.5–5.1)
Sodium: 135 mmol/L (ref 135–145)

## 2022-12-27 LAB — CBC
HCT: 32 % — ABNORMAL LOW (ref 39.0–52.0)
Hemoglobin: 10.8 g/dL — ABNORMAL LOW (ref 13.0–17.0)
MCH: 32.2 pg (ref 26.0–34.0)
MCHC: 33.8 g/dL (ref 30.0–36.0)
MCV: 95.5 fL (ref 80.0–100.0)
Platelets: 182 10*3/uL (ref 150–400)
RBC: 3.35 MIL/uL — ABNORMAL LOW (ref 4.22–5.81)
RDW: 13.5 % (ref 11.5–15.5)
WBC: 6.4 10*3/uL (ref 4.0–10.5)
nRBC: 0 % (ref 0.0–0.2)

## 2022-12-27 MED ORDER — METOPROLOL TARTRATE 5 MG/5ML IV SOLN
5.0000 mg | Freq: Once | INTRAVENOUS | Status: AC
  Administered 2022-12-27: 5 mg via INTRAVENOUS

## 2022-12-27 MED ORDER — IOHEXOL 350 MG/ML SOLN
95.0000 mL | Freq: Once | INTRAVENOUS | Status: AC | PRN
  Administered 2022-12-27: 95 mL via INTRAVENOUS

## 2022-12-27 MED ORDER — LORATADINE 10 MG PO TABS
10.0000 mg | ORAL_TABLET | Freq: Every day | ORAL | Status: DC
  Administered 2022-12-27 – 2022-12-28 (×2): 10 mg via ORAL
  Filled 2022-12-27 (×2): qty 1

## 2022-12-27 MED ORDER — FLUTICASONE PROPIONATE 50 MCG/ACT NA SUSP
2.0000 | Freq: Every day | NASAL | Status: DC
  Administered 2022-12-27 – 2022-12-28 (×2): 2 via NASAL
  Filled 2022-12-27: qty 16

## 2022-12-27 MED ORDER — METOPROLOL TARTRATE 5 MG/5ML IV SOLN
INTRAVENOUS | Status: AC
  Filled 2022-12-27: qty 10

## 2022-12-27 MED ORDER — METOPROLOL TARTRATE 50 MG PO TABS
50.0000 mg | ORAL_TABLET | Freq: Once | ORAL | Status: AC
  Administered 2022-12-27: 50 mg via ORAL
  Filled 2022-12-27: qty 1

## 2022-12-27 MED ORDER — CLOPIDOGREL BISULFATE 75 MG PO TABS
75.0000 mg | ORAL_TABLET | Freq: Every day | ORAL | Status: DC
  Administered 2022-12-28: 75 mg via ORAL
  Filled 2022-12-27: qty 1

## 2022-12-27 MED FILL — Midazolam HCl Inj 2 MG/2ML (Base Equivalent): INTRAMUSCULAR | Qty: 2 | Status: AC

## 2022-12-27 NOTE — Progress Notes (Addendum)
PROGRESS NOTE    Allen Torres  JXB:[id number] DOB: 08-14-1940 DOA: 12/24/2022 PCP: Laurena Bering, DO  1/M with history of hypertension, CVA, CKD 3a to the ED with with dyspnea on exertion x1 week -In the ED noted to be hypoxic, last x-ray noted no effusions, CTA chest negative for PE above for pulmonary edema lateral pleural effusions, creat 1.49, BNP 1660 and troponin 1916   Subjective: -Feels better overall, wants to go home  Assessment and Plan:  Acute systolic CHF -New diagnosis, echo with EF of 25-30%, normal RV and severe aortic stenosis -Cards consulting,  -Volume status improving, urine output inaccurate, weight down 7 LB -Cards following, unsuccessful attempts at radial and femoral cath yesterday -Plan for coronary CTA today -Discussed potential need for palliative care eval with daughter as well  Severe aortic stenosis -See discussion above   Elevated troponin  -Suspect recent NSTEMI, was on IV heparin X48 hours, unsuccessful attempts at radial and femoral cath yesterday -Discontinue IV heparin   CKD 3A  -Creatinine slightly higher, holding diuretics  Chronic anemia Stable, monitor   Hx of CVA  - Continue ASA     Thoracic aortic aneurysm  - 4.7 cm ascending thoracic aortic aneurysm noted on CT in ED  - Outpatient follow-up recommended    Mild chronic anemia -Stable, monitor   DVT prophylaxis: lovenox Code Status: DNR Family Communication: Daughter at bedside Disposition Plan: Home likely 48h  Consultants:    Procedures:   Antimicrobials:    Objective: Vitals:   12/27/22 0040 12/27/22 0350 12/27/22 0709 12/27/22 1151  BP: (!) 118/57 110/63 (!) 120/57 (!) 141/81  Pulse: 81 77 80 83  Resp: 18 18 16 18   Temp: 99 F (37.2 C) (!) 97.5 F (36.4 C) 97.7 F (36.5 C) 99.1 F (37.3 C)  TempSrc: Oral Oral Oral Oral  SpO2: 95% 93% 95% 95%  Weight:  92.6 kg    Height:        Intake/Output Summary (Last 24 hours) at 12/27/2022 1158 Last data  filed at 12/27/2022 1040 Gross per 24 hour  Intake 1722.61 ml  Output 1900 ml  Net -177.39 ml   Filed Weights   12/25/22 1824 12/26/22 0510 12/27/22 0350  Weight: 96 kg 95.2 kg 92.6 kg    Examination:  General exam: Pleasant elderly male sitting up in bed,  HEENT: positive JVD CVS: S1-S2, rate loud systolic murmur decreased breath sounds at the bases Abdomen: Soft, obese, nontender, bowel sounds present  Ext: trace edema Neuro:  residual left-sided weakness facial droop Psychiatry:  Mood & affect appropriate.     Data Reviewed:   CBC: Recent Labs  Lab 12/24/22 2136 12/25/22 0216 12/25/22 0620 12/26/22 0332 12/26/22 1526 12/26/22 1534 12/26/22 1535 12/26/22 1903 12/27/22 0318  WBC 7.8  --  6.8 6.4  --   --   --  6.3 6.4  NEUTROABS 5.2  --   --   --   --   --   --   --   --   HGB 11.8*   < > 10.2* 10.6* 10.5* 10.9* 10.9* 11.6* 10.8*  HCT 34.5*   < > 30.2* 31.4* 31.0* 32.0* 32.0* 33.3* 32.0*  MCV 98.6  --  96.8 94.9  --   --   --  97.4 95.5  PLT 161  --  156 170  --   --   --  176 182   < > = values in this interval not displayed.   Basic  Metabolic Panel: Recent Labs  Lab 12/24/22 2136 12/25/22 0216 12/25/22 0620 12/26/22 0332 12/26/22 1526 12/26/22 1534 12/26/22 1535 12/26/22 1903 12/27/22 0318  NA 133*   < > 133* 134* 135 136 136  --  135  K 4.6   < > 4.1 3.7 4.2 4.2 4.2  --  4.0  CL 101  --  100 101  --   --   --   --  101  CO2 24  --  21* 24  --   --   --   --  21*  GLUCOSE 130*  --  104* 94  --   --   --   --  91  BUN 22  --  24* 24*  --   --   --   --  29*  CREATININE 1.49*  --  1.43* 1.44*  --   --   --  1.57* 1.68*  CALCIUM 8.6*  --  8.4* 8.5*  --   --   --   --  8.9  MG  --   --   --  2.0  --   --   --   --   --    < > = values in this interval not displayed.   GFR: Estimated Creatinine Clearance: 38.1 mL/min (A) (by C-G formula based on SCr of 1.68 mg/dL (H)). Liver Function Tests: Recent Labs  Lab 12/24/22 2136  AST 31  ALT 17   ALKPHOS 59  BILITOT 1.1  PROT 5.9*  ALBUMIN 3.4*   No results for input(s): "LIPASE", "AMYLASE" in the last 168 hours. No results for input(s): "AMMONIA" in the last 168 hours. Coagulation Profile: No results for input(s): "INR", "PROTIME" in the last 168 hours. Cardiac Enzymes: No results for input(s): "CKTOTAL", "CKMB", "CKMBINDEX", "TROPONINI" in the last 168 hours. BNP (last 3 results) No results for input(s): "PROBNP" in the last 8760 hours. HbA1C: No results for input(s): "HGBA1C" in the last 72 hours. CBG: No results for input(s): "GLUCAP" in the last 168 hours. Lipid Profile: Recent Labs    12/27/22 0318  CHOL 231*  HDL 34*  LDLCALC 167*  TRIG 148  CHOLHDL 6.8   Thyroid Function Tests: No results for input(s): "TSH", "T4TOTAL", "FREET4", "T3FREE", "THYROIDAB" in the last 72 hours. Anemia Panel: No results for input(s): "VITAMINB12", "FOLATE", "FERRITIN", "TIBC", "IRON", "RETICCTPCT" in the last 72 hours. Urine analysis:    Component Value Date/Time   COLORURINE YELLOW 06/01/2021 2145   APPEARANCEUR CLEAR 06/01/2021 2145   LABSPEC 1.011 06/01/2021 2145   PHURINE 5.0 06/01/2021 2145   GLUCOSEU NEGATIVE 06/01/2021 2145   HGBUR NEGATIVE 06/01/2021 2145   BILIRUBINUR NEGATIVE 06/01/2021 2145   KETONESUR NEGATIVE 06/01/2021 2145   PROTEINUR NEGATIVE 06/01/2021 2145   NITRITE NEGATIVE 06/01/2021 2145   LEUKOCYTESUR NEGATIVE 06/01/2021 2145   Sepsis Labs: @LABRCNTIP (procalcitonin:4,lacticidven:4)  ) Recent Results (from the past 240 hour(s))  SARS Coronavirus 2 by RT PCR (hospital order, performed in [facility] hospital lab) *cepheid single result test* Anterior Nasal Swab     Status: None   Collection Time: 12/24/22  9:46 PM   Specimen: Anterior Nasal Swab  Result Value Ref Range Status   SARS Coronavirus 2 by RT PCR NEGATIVE NEGATIVE Final    Comment: Performed at [facility], [address] [place], [place] [address]     Radiology  Studies: CARDIAC CATHETERIZATION  Result Date: 12/27/2022 Extremely tortuous right innominate and thoracic arch system, unable to reach  the aortic root via right radial access Aneurysmal dilation of the thoracic aorta with extensive angulation making it impossible to reach the aortic and therefore the ascending aorta.  Aneurysmal dilation seen with thoracic aortography. Procedure aborted, although catheters were placed to attempt angiography, I was not able to engage either the right or left coronary artery, nor was able to cross the aortic valve via the radial access.  Was not able to reach the aortic root via femoral access. 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 PLAN OF CARE:  Patient will return to his nursing unit.  The plan will be to gently hydrate and perform CT angiogram of the aorta and coronary arteries in order to fully assess both.  He does not seem to be a very stable candidate for femoral access TAVR. 3 Bryan Lemma, MD   PERIPHERAL VASCULAR CATHETERIZATION  Result Date: 12/27/2022 Extremely tortuous right innominate and thoracic arch system, unable to reach the aortic root via right radial access Aneurysmal dilation of the thoracic aorta with extensive angulation making it impossible to reach the aortic and therefore the ascending aorta.  Aneurysmal dilation seen with thoracic aortography. Procedure aborted, although catheters were placed to attempt angiography, I was not able to engage either the right or left coronary artery, nor was able to cross the aortic valve via the radial access.  Was not able to reach the aortic root via femoral access. 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 PLAN OF CARE:  Patient will return to his nursing unit.  The plan will be to gently hydrate and perform CT angiogram of the aorta and coronary arteries in order to fully assess both.  He does not seem to be a very stable candidate for femoral access TAVR. 3  Bryan Lemma, MD   ECHOCARDIOGRAM COMPLETE  Result Date: 12/25/2022    ECHOCARDIOGRAM REPORT   Patient Name:   Allen Torres Date of Exam: 12/25/2022 Medical Rec #:  [id number]     Height:       69.0 in Accession #:    [id number]    Weight:       208.0 lb Date of Birth:  [birth year]    BSA:          2.101 m Patient Age:    81 years      BP:           131/86 mmHg Patient Gender: M             HR:           74 bpm. Exam Location:  Inpatient Procedure: 2D Echo, Color Doppler and Cardiac Doppler Indications:    CHF, Elevated Troponin  History:        Patient has prior history of Echocardiogram examinations, most                 recent 06/03/2021. CHF, Stroke and CKD; Risk Factors:Dyslipidemia                 and Hypertension.  Sonographer:    Milbert Coulter Referring Phys: [id number] TIMOTHY S OPYD  Sonographer Comments: Image acquisition challenging due to respiratory motion and Image acquisition challenging due to patient body habitus. IMPRESSIONS  1. Left ventricular ejection fraction, by estimation, is 25 to 30%. The left ventricle has severely decreased function. Left ventricular endocardial border not optimally defined to evaluate regional wall motion. There is moderate concentric left ventricular hypertrophy. Left ventricular diastolic parameters are consistent with Grade II diastolic dysfunction (  pseudonormalization).  2. Aortic valve appears bicuspid and is severely calcified. There is severely restricted leaflet mobility. Vmax 2.8 m/s, MG 18 mmHG, AVA 0.95 cm2, DI 0.25. Findings are concerning for severe low flow low gradient aortic stenosis. The aortic valve is bicuspid. There is severe calcifcation of the aortic valve. There is severe thickening of the aortic valve. Aortic valve regurgitation is not visualized. Severe aortic valve stenosis.  3. Right ventricular systolic function is normal. The right ventricular size is mildly enlarged. There is mildly elevated pulmonary artery systolic pressure. The estimated  right ventricular systolic pressure is 36.6 mmHg.  4. Left atrial size was severely dilated.  5. The mitral valve is degenerative. Mild mitral valve regurgitation. No evidence of mitral stenosis. Moderate mitral annular calcification.  6. The inferior vena cava is normal in size with greater than 50% respiratory variability, suggesting right atrial pressure of 3 mmHg.  7. Aortic dilatation noted. There is mild dilatation of the aortic root, measuring 41 mm. Comparison(s): Changes from prior study are noted. The left ventricular function is significantly worse. Aortic stenosis appears to be severe low flow low gradient AS. FINDINGS  Left Ventricle: Left ventricular ejection fraction, by estimation, is 25 to 30%. The left ventricle has severely decreased function. Left ventricular endocardial border not optimally defined to evaluate regional wall motion. The left ventricular internal cavity size was normal in size. There is moderate concentric left ventricular hypertrophy. Left ventricular diastolic parameters are consistent with Grade II diastolic dysfunction (pseudonormalization). Right Ventricle: The right ventricular size is mildly enlarged. No increase in right ventricular wall thickness. Right ventricular systolic function is normal. There is mildly elevated pulmonary artery systolic pressure. The tricuspid regurgitant velocity is 2.90 m/s, and with an assumed right atrial pressure of 3 mmHg, the estimated right ventricular systolic pressure is 36.6 mmHg. Left Atrium: Left atrial size was severely dilated. Right Atrium: Right atrial size was normal in size. Pericardium: There is no evidence of pericardial effusion. Presence of epicardial fat layer. Mitral Valve: The mitral valve is degenerative in appearance. There is mild calcification of the anterior and posterior mitral valve leaflet(s). Moderate mitral annular calcification. Mild mitral valve regurgitation. No evidence of mitral valve stenosis. Tricuspid  Valve: The tricuspid valve is grossly normal. Tricuspid valve regurgitation is mild . No evidence of tricuspid stenosis. Aortic Valve: Aortic valve appears bicuspid and is severely calcified. There is severely restricted leaflet mobility. Vmax 2.8 m/s, MG 18 mmHG, AVA 0.95 cm2, DI 0.25. Findings are concerning for severe low flow low gradient aortic stenosis. The aortic valve is bicuspid. There is severe calcifcation of the aortic valve. There is severe thickening of the aortic valve. Aortic valve regurgitation is not visualized. Severe aortic stenosis is present. Aortic valve mean gradient measures 18.0 mmHg. Aortic valve peak gradient measures 32.0 mmHg. Aortic valve area, by VTI measures 0.95 cm. Pulmonic Valve: The pulmonic valve was grossly normal. Pulmonic valve regurgitation is trivial. No evidence of pulmonic stenosis. Aorta: Aortic dilatation noted. There is mild dilatation of the aortic root, measuring 41 mm. Venous: The inferior vena cava is normal in size with greater than 50% respiratory variability, suggesting right atrial pressure of 3 mmHg. IAS/Shunts: The atrial septum is grossly normal.  LEFT VENTRICLE PLAX 2D LVIDd:         5.00 cm   Diastology LVIDs:         4.50 cm   LV e' medial:    3.70 cm/s LV PW:  1.40 cm   LV E/e' medial:  34.3 LV IVS:        1.40 cm   LV e' lateral:   9.03 cm/s LVOT diam:     2.20 cm   LV E/e' lateral: 14.1 LV SV:         56 LV SV Index:   27 LVOT Area:     3.80 cm  RIGHT VENTRICLE RV S prime:     11.40 cm/s TAPSE (M-mode): 2.4 cm LEFT ATRIUM              Index        RIGHT ATRIUM           Index LA diam:        3.30 cm  1.57 cm/m   RA Area:     18.00 cm LA Vol (A2C):   167.0 ml 79.50 ml/m  RA Volume:   48.80 ml  23.23 ml/m LA Vol (A4C):   76.8 ml  36.56 ml/m LA Biplane Vol: 126.0 ml 59.98 ml/m  AORTIC VALVE AV Area (Vmax):    0.92 cm AV Area (Vmean):   0.92 cm AV Area (VTI):     0.95 cm AV Vmax:           283.00 cm/s AV Vmean:          194.500 cm/s AV  VTI:            0.590 m AV Peak Grad:      32.0 mmHg AV Mean Grad:      18.0 mmHg LVOT Vmax:         68.50 cm/s LVOT Vmean:        46.900 cm/s LVOT VTI:          0.147 m LVOT/AV VTI ratio: 0.25  AORTA Ao Root diam: 4.10 cm Ao Asc diam:  3.90 cm MITRAL VALVE                TRICUSPID VALVE MV Area (PHT): 4.36 cm     TR Peak grad:   33.6 mmHg MV Decel Time: 174 msec     TR Vmax:        290.00 cm/s MV E velocity: 127.00 cm/s MV A velocity: 95.20 cm/s   SHUNTS MV E/A ratio:  1.33         Systemic VTI:  0.15 m                             Systemic Diam: 2.20 cm Lennie Odor MD Electronically signed by Lennie Odor MD Signature Date/Time: 12/25/2022/5:07:51 PM    Final      Scheduled Meds:  [START ON 12/28/2022] clopidogrel  75 mg Oral Daily   doxazosin  4 mg Oral BID   enoxaparin (LOVENOX) injection  40 mg Subcutaneous Q24H   fluticasone  2 spray Each Nare Daily   loratadine  10 mg Oral Daily   LORazepam  0.5 mg Intravenous Once   sodium chloride flush  3 mL Intravenous Q12H   sodium chloride flush  3 mL Intravenous Q12H   Continuous Infusions:  sodium chloride       LOS: 2 days    Time spent:    Zannie Cove, MD [facility]   12/27/2022, 11:58 AM

## 2022-12-27 NOTE — Progress Notes (Addendum)
Rounding Note    Patient Name: Allen Torres Date of Encounter: 12/27/2022  [facility] Cardiologist: None   Subjective   No complaints this morning. Hopeful to go home soon. Discussed plan for CCTa with patient and family at the bedside.   Inpatient Medications    Scheduled Meds:  aspirin EC  81 mg Oral Daily   doxazosin  4 mg Oral BID   enoxaparin (LOVENOX) injection  40 mg Subcutaneous Q24H   furosemide  40 mg Intravenous BID   loratadine  10 mg Oral Daily   LORazepam  0.5 mg Intravenous Once   sodium chloride flush  3 mL Intravenous Q12H   sodium chloride flush  3 mL Intravenous Q12H   Continuous Infusions:  sodium chloride     PRN Meds: sodium chloride, acetaminophen **OR** acetaminophen, guaiFENesin-dextromethorphan, ondansetron **OR** ondansetron (ZOFRAN) IV, senna-docusate, sodium chloride flush   Vital Signs    Vitals:   12/26/22 2140 12/27/22 0040 12/27/22 0350 12/27/22 0709  BP: 124/64 (!) 118/57 110/63 (!) 120/57  Pulse:  81 77 80  Resp:  18 18 16   Temp:  99 F (37.2 C) (!) 97.5 F (36.4 C) 97.7 F (36.5 C)  TempSrc:  Oral Oral Oral  SpO2:  95% 93% 95%  Weight:   92.6 kg   Height:        Intake/Output Summary (Last 24 hours) at 12/27/2022 1019 Last data filed at 12/27/2022 0820 Gross per 24 hour  Intake 1722.61 ml  Output 1500 ml  Net 222.61 ml      12/27/2022    3:50 AM 12/26/2022    5:10 AM 12/25/2022    6:24 PM  Last 3 Weights  Weight (lbs) 204 lb 1.6 oz 209 lb 14.1 oz 211 lb 10.3 oz  Weight (kg) 92.579 kg 95.2 kg 96 kg      Telemetry    Sinus Rhythm - Personally Reviewed  Physical Exam   GEN: No acute distress.   Neck: No JVD Cardiac: RRR, + 3/6 systolic murmur RUSB, no rubs, or gallops.  Respiratory: Clear to auscultation bilaterally. GI: Soft, nontender, non-distended  MS: No edema; No deformity. Right radial/femoral cath site stable Neuro:  Nonfocal  Psych: Normal affect   Labs    High Sensitivity Troponin:    Recent Labs  Lab 12/24/22 2136 12/24/22 2327 12/25/22 0620  TROPONINIHS 1,916* 2,106* 2,387*     Chemistry Recent Labs  Lab 12/24/22 2136 12/25/22 0216 12/25/22 0620 12/26/22 0332 12/26/22 1526 12/26/22 1534 12/26/22 1535 12/26/22 1903 12/27/22 0318  NA 133*   < > 133* 134*   < > 136 136  --  135  K 4.6   < > 4.1 3.7   < > 4.2 4.2  --  4.0  CL 101  --  100 101  --   --   --   --  101  CO2 24  --  21* 24  --   --   --   --  21*  GLUCOSE 130*  --  104* 94  --   --   --   --  91  BUN 22  --  24* 24*  --   --   --   --  29*  CREATININE 1.49*  --  1.43* 1.44*  --   --   --  1.57* 1.68*  CALCIUM 8.6*  --  8.4* 8.5*  --   --   --   --  8.9  MG  --   --   --  2.0  --   --   --   --   --   PROT 5.9*  --   --   --   --   --   --   --   --   ALBUMIN 3.4*  --   --   --   --   --   --   --   --   AST 31  --   --   --   --   --   --   --   --   ALT 17  --   --   --   --   --   --   --   --   ALKPHOS 59  --   --   --   --   --   --   --   --   BILITOT 1.1  --   --   --   --   --   --   --   --   GFRNONAA 47*  --  49* 49*  --   --   --  44* 41*  ANIONGAP 8  --  12 9  --   --   --   --  13   < > = values in this interval not displayed.    Lipids No results for input(s): "CHOL", "TRIG", "HDL", "LABVLDL", "LDLCALC", "CHOLHDL" in the last 168 hours.  Hematology Recent Labs  Lab 12/26/22 0332 12/26/22 1526 12/26/22 1535 12/26/22 1903 12/27/22 0318  WBC 6.4  --   --  6.3 6.4  RBC 3.31*  --   --  3.42* 3.35*  HGB 10.6*   < > 10.9* 11.6* 10.8*  HCT 31.4*   < > 32.0* 33.3* 32.0*  MCV 94.9  --   --  97.4 95.5  MCH 32.0  --   --  33.9 32.2  MCHC 33.8  --   --  34.8 33.8  RDW 13.6  --   --  13.5 13.5  PLT 170  --   --  176 182   < > = values in this interval not displayed.   Thyroid No results for input(s): "TSH", "FREET4" in the last 168 hours.  BNP Recent Labs  Lab 12/24/22 2136  BNP 1,659.5*    DDimer No results for input(s): "DDIMER" in the last 168 hours.   Radiology     ECHOCARDIOGRAM COMPLETE  Result Date: 12/25/2022    ECHOCARDIOGRAM REPORT   Patient Name:   NEKODA GOLDWIRE Date of Exam: 12/25/2022 Medical Rec #:  [id number]     Height:       69.0 in Accession #:    [id number]    Weight:       208.0 lb Date of Birth:  [birth year]    BSA:          2.101 m Patient Age:    81 years      BP:           131/86 mmHg Patient Gender: M             HR:           74 bpm. Exam Location:  Inpatient Procedure: 2D Echo, Color Doppler and Cardiac Doppler Indications:    CHF, Elevated Troponin  History:        Patient has prior history of Echocardiogram examinations, most  recent 06/03/2021. CHF, Stroke and CKD; Risk Factors:Dyslipidemia                 and Hypertension.  Sonographer:    Milbert Coulter Referring Phys: [id number] TIMOTHY S OPYD  Sonographer Comments: Image acquisition challenging due to respiratory motion and Image acquisition challenging due to patient body habitus. IMPRESSIONS  1. Left ventricular ejection fraction, by estimation, is 25 to 30%. The left ventricle has severely decreased function. Left ventricular endocardial border not optimally defined to evaluate regional wall motion. There is moderate concentric left ventricular hypertrophy. Left ventricular diastolic parameters are consistent with Grade II diastolic dysfunction (pseudonormalization).  2. Aortic valve appears bicuspid and is severely calcified. There is severely restricted leaflet mobility. Vmax 2.8 m/s, MG 18 mmHG, AVA 0.95 cm2, DI 0.25. Findings are concerning for severe low flow low gradient aortic stenosis. The aortic valve is bicuspid. There is severe calcifcation of the aortic valve. There is severe thickening of the aortic valve. Aortic valve regurgitation is not visualized. Severe aortic valve stenosis.  3. Right ventricular systolic function is normal. The right ventricular size is mildly enlarged. There is mildly elevated pulmonary artery systolic pressure. The estimated right ventricular  systolic pressure is 36.6 mmHg.  4. Left atrial size was severely dilated.  5. The mitral valve is degenerative. Mild mitral valve regurgitation. No evidence of mitral stenosis. Moderate mitral annular calcification.  6. The inferior vena cava is normal in size with greater than 50% respiratory variability, suggesting right atrial pressure of 3 mmHg.  7. Aortic dilatation noted. There is mild dilatation of the aortic root, measuring 41 mm. Comparison(s): Changes from prior study are noted. The left ventricular function is significantly worse. Aortic stenosis appears to be severe low flow low gradient AS. FINDINGS  Left Ventricle: Left ventricular ejection fraction, by estimation, is 25 to 30%. The left ventricle has severely decreased function. Left ventricular endocardial border not optimally defined to evaluate regional wall motion. The left ventricular internal cavity size was normal in size. There is moderate concentric left ventricular hypertrophy. Left ventricular diastolic parameters are consistent with Grade II diastolic dysfunction (pseudonormalization). Right Ventricle: The right ventricular size is mildly enlarged. No increase in right ventricular wall thickness. Right ventricular systolic function is normal. There is mildly elevated pulmonary artery systolic pressure. The tricuspid regurgitant velocity is 2.90 m/s, and with an assumed right atrial pressure of 3 mmHg, the estimated right ventricular systolic pressure is 36.6 mmHg. Left Atrium: Left atrial size was severely dilated. Right Atrium: Right atrial size was normal in size. Pericardium: There is no evidence of pericardial effusion. Presence of epicardial fat layer. Mitral Valve: The mitral valve is degenerative in appearance. There is mild calcification of the anterior and posterior mitral valve leaflet(s). Moderate mitral annular calcification. Mild mitral valve regurgitation. No evidence of mitral valve stenosis. Tricuspid Valve: The tricuspid  valve is grossly normal. Tricuspid valve regurgitation is mild . No evidence of tricuspid stenosis. Aortic Valve: Aortic valve appears bicuspid and is severely calcified. There is severely restricted leaflet mobility. Vmax 2.8 m/s, MG 18 mmHG, AVA 0.95 cm2, DI 0.25. Findings are concerning for severe low flow low gradient aortic stenosis. The aortic valve is bicuspid. There is severe calcifcation of the aortic valve. There is severe thickening of the aortic valve. Aortic valve regurgitation is not visualized. Severe aortic stenosis is present. Aortic valve mean gradient measures 18.0 mmHg. Aortic valve peak gradient measures 32.0 mmHg. Aortic valve area, by VTI measures 0.95 cm. Pulmonic Valve:  The pulmonic valve was grossly normal. Pulmonic valve regurgitation is trivial. No evidence of pulmonic stenosis. Aorta: Aortic dilatation noted. There is mild dilatation of the aortic root, measuring 41 mm. Venous: The inferior vena cava is normal in size with greater than 50% respiratory variability, suggesting right atrial pressure of 3 mmHg. IAS/Shunts: The atrial septum is grossly normal.  LEFT VENTRICLE PLAX 2D LVIDd:         5.00 cm   Diastology LVIDs:         4.50 cm   LV e' medial:    3.70 cm/s LV PW:         1.40 cm   LV E/e' medial:  34.3 LV IVS:        1.40 cm   LV e' lateral:   9.03 cm/s LVOT diam:     2.20 cm   LV E/e' lateral: 14.1 LV SV:         56 LV SV Index:   27 LVOT Area:     3.80 cm  RIGHT VENTRICLE RV S prime:     11.40 cm/s TAPSE (M-mode): 2.4 cm LEFT ATRIUM              Index        RIGHT ATRIUM           Index LA diam:        3.30 cm  1.57 cm/m   RA Area:     18.00 cm LA Vol (A2C):   167.0 ml 79.50 ml/m  RA Volume:   48.80 ml  23.23 ml/m LA Vol (A4C):   76.8 ml  36.56 ml/m LA Biplane Vol: 126.0 ml 59.98 ml/m  AORTIC VALVE AV Area (Vmax):    0.92 cm AV Area (Vmean):   0.92 cm AV Area (VTI):     0.95 cm AV Vmax:           283.00 cm/s AV Vmean:          194.500 cm/s AV VTI:            0.590 m  AV Peak Grad:      32.0 mmHg AV Mean Grad:      18.0 mmHg LVOT Vmax:         68.50 cm/s LVOT Vmean:        46.900 cm/s LVOT VTI:          0.147 m LVOT/AV VTI ratio: 0.25  AORTA Ao Root diam: 4.10 cm Ao Asc diam:  3.90 cm MITRAL VALVE                TRICUSPID VALVE MV Area (PHT): 4.36 cm     TR Peak grad:   33.6 mmHg MV Decel Time: 174 msec     TR Vmax:        290.00 cm/s MV E velocity: 127.00 cm/s MV A velocity: 95.20 cm/s   SHUNTS MV E/A ratio:  1.33         Systemic VTI:  0.15 m                             Systemic Diam: 2.20 cm Lennie Odor MD Electronically signed by Lennie Odor MD Signature Date/Time: 12/25/2022/5:07:51 PM    Final      Patient Profile     82 y.o. male with PMH of aortic stenosis, L MCA stroke, CKD who presented with worsening shortness of breath and concern  for CHF.   Assessment & Plan    HFrEF -- presented with acute volume overload on admission. Echo showed newly reduced LVEF of 25-30%, moderate concentric LVH, g2DD. Also concern for low-flow, low-gradient AS.  Has been diuresing with IV lasix, net - 1.2L. Weight is down 211>209>>204lbs. LE edema has improved -- was planned for [facility] yesterday but unfortunately after both radial/femoral attempts were unsuccessful to assess coronary arteries. Also unable to cross the aortic valve -- received IV lasix this morning, will DC -- GDMT limited in the setting of AS and renal disease, will hold on adding therapy until scans completed   Aortic stenosis -- concern for severe low-flow, low-gradient AS -- attempted cardiac cath yesterday but unable to engage the R/L coronaries or cross the aortic valve -- planned for coronary CTA today   Elevated troponin -- hsTn 1916>>2106>>2387, flat trend. No complaints of chest pain. As above, unable to complete [facility] with plans for CCTa today -- may be demand ischemia in the setting of CHF and AS  HLD -- LDL 167, HDL 34 -- hx of statin intolerance  For questions or updates, please  contact [facility] Please consult [email or web address] for contact info under        Signed, Laverda Page, NP  12/27/2022, 10:19 AM    Patient seen, examined. Available data reviewed. Agree with findings, assessment, and plan as outlined by Laverda Page, NP.  Patient independently interviewed and examined.  His 2 sons and daughter are at the bedside.  On exam, he is alert, oriented, in no distress.  Elderly gentleman.  HEENT is normal, JVP is normal, lungs are clear, heart is regular rate and rhythm with a 3/6 crescendo decrescendo murmur at the right upper sternal border, abdomen is soft and nontender, extremities have no edema.  Right radial and right groin catheterization sites are clear with no hematoma or ecchymoses.  We discussed his treatment options.  Unfortunately catheters could not be advanced to his heart yesterday due to severe subclavian, innominate, and aortic tortuosity.  We have recommended TAVR CTA scans to evaluate anatomic feasibility and access options for TAVR.  He will have a gated CTA of the heart and a CTA of the chest, abdomen, and pelvis.  Further plans pending his CTA results.  We discussed the natural history of aortic stenosis and congestive heart failure today at length.  They understand that his prognosis will be poor if he is not a candidate for TAVR.  Will continue to work with medical therapy.  Hold further diuretics for now to decrease risk of AKI related to contrast administration.  Tonny Bollman, M.D. 12/27/2022 11:59 AM

## 2022-12-28 ENCOUNTER — Encounter (HOSPITAL_COMMUNITY): Payer: Self-pay | Admitting: Cardiology

## 2022-12-28 DIAGNOSIS — I5021 Acute systolic (congestive) heart failure: Secondary | ICD-10-CM | POA: Diagnosis not present

## 2022-12-28 LAB — BASIC METABOLIC PANEL
Anion gap: 16 — ABNORMAL HIGH (ref 5–15)
BUN: 38 mg/dL — ABNORMAL HIGH (ref 8–23)
CO2: 17 mmol/L — ABNORMAL LOW (ref 22–32)
Calcium: 8.4 mg/dL — ABNORMAL LOW (ref 8.9–10.3)
Chloride: 99 mmol/L (ref 98–111)
Creatinine, Ser: 1.82 mg/dL — ABNORMAL HIGH (ref 0.61–1.24)
GFR, Estimated: 37 mL/min — ABNORMAL LOW (ref >60)
Glucose, Bld: 100 mg/dL — ABNORMAL HIGH (ref 70–99)
Potassium: 4.5 mmol/L (ref 3.5–5.1)
Sodium: 132 mmol/L — ABNORMAL LOW (ref 135–145)

## 2022-12-28 MED ORDER — LORATADINE 10 MG PO TABS: 10.0000 mg | ORAL_TABLET | Freq: Every day | ORAL | 0 refills | Status: AC

## 2022-12-28 MED ORDER — FUROSEMIDE 40 MG PO TABS: 40.0000 mg | ORAL_TABLET | Freq: Every day | ORAL | 0 refills | Status: AC

## 2022-12-28 MED ORDER — CARVEDILOL 3.125 MG PO TABS: 3.1250 mg | ORAL_TABLET | Freq: Two times a day (BID) | ORAL | 0 refills | Status: AC

## 2022-12-28 MED ORDER — FUROSEMIDE 40 MG PO TABS: 40.0000 mg | ORAL_TABLET | Freq: Every day | ORAL | Status: DC

## 2022-12-28 MED ORDER — CARVEDILOL 3.125 MG PO TABS: 3.1250 mg | ORAL_TABLET | Freq: Two times a day (BID) | ORAL | Status: DC

## 2022-12-28 MED ORDER — EZETIMIBE 10 MG PO TABS: 10.0000 mg | ORAL_TABLET | Freq: Every day | ORAL | Status: DC

## 2022-12-28 MED ORDER — EZETIMIBE 10 MG PO TABS: 10.0000 mg | ORAL_TABLET | Freq: Every day | ORAL | 3 refills | Status: AC

## 2022-12-28 NOTE — Plan of Care (Signed)
Problem: Education: Goal: Ability to demonstrate management of disease process will improve Outcome: Progressing Goal: Ability to verbalize understanding of medication therapies will improve Outcome: Progressing Goal: Individualized Educational Video(s) Outcome: Progressing   Problem: Activity: Goal: Capacity to carry out activities will improve Outcome: Progressing   Problem: Cardiac: Goal: Ability to achieve and maintain adequate cardiopulmonary perfusion will improve Outcome: Progressing   Problem: Education: Goal: Knowledge of General Education information will improve Description: Including pain rating scale, medication(s)/side effects and non-pharmacologic comfort measures Outcome: Progressing   Problem: Health Behavior/Discharge Planning: Goal: Ability to manage health-related needs will improve Outcome: Progressing   Problem: Clinical Measurements: Goal: Ability to maintain clinical measurements within normal limits will improve Outcome: Progressing Goal: Will remain free from infection Outcome: Progressing Goal: Diagnostic test results will improve Outcome: Progressing Goal: Respiratory complications will improve Outcome: Progressing Goal: Cardiovascular complication will be avoided Outcome: Progressing   Problem: Activity: Goal: Risk for activity intolerance will decrease Outcome: Progressing   Problem: Nutrition: Goal: Adequate nutrition will be maintained Outcome: Progressing   Problem: Coping: Goal: Level of anxiety will decrease Outcome: Progressing   Problem: Elimination: Goal: Will not experience complications related to bowel motility Outcome: Progressing Goal: Will not experience complications related to urinary retention Outcome: Progressing   Problem: Pain Managment: Goal: General experience of comfort will improve Outcome: Progressing   Problem: Safety: Goal: Ability to remain free from injury will improve Outcome: Progressing    Problem: Skin Integrity: Goal: Risk for impaired skin integrity will decrease Outcome: Progressing   Problem: Education: Goal: Understanding of CV disease, CV risk reduction, and recovery process will improve Outcome: Progressing Goal: Individualized Educational Video(s) Outcome: Progressing   Problem: Activity: Goal: Ability to return to baseline activity level will improve Outcome: Progressing   Problem: Cardiovascular: Goal: Ability to achieve and maintain adequate cardiovascular perfusion will improve Outcome: Progressing Goal: Vascular access site(s) Level 0-1 will be maintained Outcome: Progressing   Problem: Health Behavior/Discharge Planning: Goal: Ability to safely manage health-related needs after discharge will improve Outcome: Progressing

## 2022-12-28 NOTE — Progress Notes (Signed)
Patient Name: Allen Torres Date of Encounter: 12/28/2022 [facility] Cardiologist: None   Interval Summary  .    Very eager to go home.  No chest pain or shortness of breath.  Vital Signs .    Vitals:   12/28/22 0021 12/28/22 0619 12/28/22 0705 12/28/22 1203  BP: (!) 131/91 (!) 141/69 (!) 140/79 (!) 140/77  Pulse: 85 86 94 92  Resp: 18 18 17 17   Temp: 98.1 F (36.7 C) 98.2 F (36.8 C) 99.3 F (37.4 C)   TempSrc: Oral Oral Oral   SpO2: 90% 92% 94% 95%  Weight:  91.7 kg    Height:        Intake/Output Summary (Last 24 hours) at 12/28/2022 1235 Last data filed at 12/28/2022 0854 Gross per 24 hour  Intake 713 ml  Output 100 ml  Net 613 ml      12/28/2022    6:19 AM 12/27/2022    3:50 AM 12/26/2022    5:10 AM  Last 3 Weights  Weight (lbs) 202 lb 1.6 oz 204 lb 1.6 oz 209 lb 14.1 oz  Weight (kg) 91.672 kg 92.579 kg 95.2 kg      Telemetry/ECG    Sinus rhythm - Personally Reviewed  Physical Exam .   GEN: No acute distress.   Neck: No JVD Cardiac: RRR, 2/6 systolic murmur at the right upper sternal border Respiratory: Clear to auscultation bilaterally. GI: Soft, nontender, non-distended  MS: No edema  Assessment & Plan .     1.  Acute systolic heart failure: Patient with severe LV dysfunction and severe aortic stenosis.  He does not have anatomy that is conducive to TAVR as he has severe vascular disease (see below for discussion).  He is not interested in any major surgery and declines cardiac surgical consultation.  He will be treated medically.  Will start him on carvedilol 3.125 mg twice daily and furosemide 40 mg daily to start tomorrow.  He has mild acute kidney injury and I would not start him on an ARB or ARNI this admission.  Would hold further GDMT titration to the outpatient setting.  Will arrange cardiology follow-up in our [place] office as he lives in proximity to [place].  The patient and family are counseled about his poor prognosis.  The  patient understands and states that he just wants to go home and "live out what time I have left." 2.  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  TAVR is not feasible and less alternative access is used with the transapical or direct aortic approach.  The patient is not at all interested in any of these invasive treatments and would only have considered minimally invasive transfemoral TAVR.  Since this cannot be done, he will not proceed with any aortic valve intervention. 3.  Elevated troponin suspect demand ischemia.  Unsuccessful heart catheterization due to inability to pass catheters beyond his occluded thoracic aorta and tortuous right subclavian artery.  Medical therapy recommended.  Patient on clopidogrel.  Not having any anginal symptoms.  Disposition: From a cardiac perspective he is stable for discharge.  He will have a palliative approach to his care.  Will arrange follow-up in our [place] office. For questions or updates, please contact  [facility] Please consult [email or web address] for contact info under        Signed, Tonny Bollman, MD

## 2022-12-28 NOTE — Discharge Summary (Signed)
Physician Discharge Summary  Allen Torres UJW:[id number] DOB: [birth year] DOA: 12/24/2022  PCP: Laurena Bering, DO  Admit date: 12/24/2022 Discharge date: 12/28/2022  Time spent: 35 minutes  Recommendations for Outpatient Follow-up:  Home with hospice services   Discharge Diagnoses:  Principal Problem:   Acute Systolic CHF`   Severe aortic stenosis   Essential hypertension   History of stroke   Stage 3a chronic kidney disease (HCC)   Mixed hyperlipidemia   Thoracic ascending aortic aneurysm (HCC)   Acute HFrEF (heart failure with reduced ejection fraction) (HCC) DO NOT RESUSCITATE  Discharge Condition: Guarded prognosis  Diet recommendation: Stable  Filed Weights   12/26/22 0510 12/27/22 0350 12/28/22 0619  Weight: 95.2 kg 92.6 kg 91.7 kg    History of present illness:  81/M with history of hypertension, CVA, CKD 3a to the ED with with dyspnea on exertion x1 week -In the ED noted to be hypoxic, last x-ray noted no effusions, CTA chest negative for PE above for pulmonary edema lateral pleural effusions, creat 1.49, BNP 1660 and troponin 1916 -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 Excell Seltzer and family today about poor prognosis -I  recommended discharge home with hospice, patient was agreeable   Severe aortic stenosis -See discussion above -Not a TAVR candidate, palliative care/hospice recommended    Elevated troponin  -Suspect recent NSTEMI, was on IV heparin X48 hours, unsuccessful attempts at radial and femoral cath yesterday -Discontinued IV heparin -Coronary CTA completed, see discussion above   CKD 3A  -Creatinine slightly higher, after CTAs, holding diuretics   Chronic anemia Stable, monitor   Hx of CVA  - Continue ASA     Thoracic aortic aneurysm  - 4.7 cm ascending thoracic aortic aneurysm noted on CT in ED  - Outpatient follow-up recommended     Mild chronic anemia -Stable, monitor      Discharge Exam: Vitals:   12/28/22 0705 12/28/22 1203  BP: (!) 140/79 (!) 140/77  Pulse: 94 92  Resp: 17 17  Temp: 99.3 F (37.4 C)   SpO2: 94% 95%   GEN: No acute distress.   Neck: No JVD Cardiac: RRR, 2/6 systolic murmur  Respiratory: Clear to auscultation bilaterally. GI: Soft, nontender, non-distended  MS: No edema  Discharge Instructions   Discharge Instructions     AMB referral to Phase II Cardiac Rehabilitation   Complete by: As directed    May not be a candidate   Diagnosis: NSTEMI   After initial evaluation and assessments completed: Virtual Based Care may be provided alone or in conjunction with Phase 2 Cardiac Rehab based on patient barriers.: Yes   Intensive Cardiac Rehabilitation (ICR) [facility] location only OR Traditional Cardiac Rehabilitation (TCR) *If criteria for ICR are not met will enroll in TCR [facility] only): Yes   Diet - low sodium heart  able to cross the aortic valve via the radial access.  Was not able to reach the aortic root via femoral access. 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 PLAN OF CARE:  Patient will return to his nursing unit.  The plan will be to gently hydrate and perform CT angiogram of the aorta and coronary arteries in order to fully assess both.  He does not seem to be a very stable candidate for femoral access TAVR. 3 Bryan Lemma, MD   ECHOCARDIOGRAM COMPLETE  Result Date: 12/25/2022    ECHOCARDIOGRAM REPORT   Patient Name:   Allen Torres Date of Exam: 12/25/2022 Medical Rec #:  [id number]     Height:       69.0 in Accession #:    [id number]    Weight:       208.0 lb Date of Birth:  March 22, 1941    BSA:          2.101 m Patient Age:    81 years      BP:           131/86 mmHg Patient Gender: M             HR:           74 bpm. Exam Location:  Inpatient Procedure: 2D Echo, Color Doppler and Cardiac Doppler Indications:    CHF, Elevated Troponin  History:        Patient has prior history of Echocardiogram examinations, most                 recent 06/03/2021. CHF, Stroke and CKD; Risk Factors:Dyslipidemia                 and Hypertension.  Sonographer:    Milbert Coulter Referring Phys: [id number] TIMOTHY S OPYD  Sonographer Comments: Image acquisition challenging due to respiratory motion and Image acquisition challenging due to patient body habitus. IMPRESSIONS  1. Left ventricular ejection fraction, by estimation, is 25 to 30%. The left ventricle has severely decreased function. Left ventricular endocardial border not optimally defined to evaluate regional wall motion. There is moderate concentric left ventricular hypertrophy. Left ventricular diastolic parameters are consistent with Grade II diastolic  dysfunction (pseudonormalization).  2. Aortic valve appears bicuspid and is severely calcified. There is severely restricted leaflet mobility. Vmax 2.8 m/s, MG 18 mmHG, AVA 0.95 cm2, DI 0.25. Findings are concerning for severe low flow low gradient aortic stenosis. The aortic valve is bicuspid. There is severe calcifcation of the aortic valve. There is severe thickening of the aortic valve. Aortic valve regurgitation is not visualized. Severe aortic valve stenosis.  3. Right ventricular systolic function is normal. The right ventricular size is mildly enlarged. There is mildly elevated pulmonary artery systolic pressure. The estimated right ventricular systolic pressure is 36.6 mmHg.  4. Left atrial size was severely dilated.  5. The mitral valve is degenerative. Mild mitral valve regurgitation. No evidence of mitral stenosis. Moderate mitral annular calcification.  6. The inferior vena cava is normal in size with greater than 50% respiratory variability, suggesting right atrial pressure of 3 mmHg.  7. Aortic dilatation noted. There is mild dilatation of the aortic root, measuring 41 mm. Comparison(s): Changes from prior study are noted. The left ventricular function is significantly worse. Aortic stenosis appears to be severe low flow low gradient AS. FINDINGS  Left Ventricle: Left ventricular ejection fraction, by estimation, is 25 to 30%. The left ventricle has severely decreased function. Left ventricular endocardial border not optimally  able to cross the aortic valve via the radial access.  Was not able to reach the aortic root via femoral access. 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 PLAN OF CARE:  Patient will return to his nursing unit.  The plan will be to gently hydrate and perform CT angiogram of the aorta and coronary arteries in order to fully assess both.  He does not seem to be a very stable candidate for femoral access TAVR. 3 Bryan Lemma, MD   ECHOCARDIOGRAM COMPLETE  Result Date: 12/25/2022    ECHOCARDIOGRAM REPORT   Patient Name:   Allen Torres Date of Exam: 12/25/2022 Medical Rec #:  [id number]     Height:       69.0 in Accession #:    [id number]    Weight:       208.0 lb Date of Birth:  March 22, 1941    BSA:          2.101 m Patient Age:    81 years      BP:           131/86 mmHg Patient Gender: M             HR:           74 bpm. Exam Location:  Inpatient Procedure: 2D Echo, Color Doppler and Cardiac Doppler Indications:    CHF, Elevated Troponin  History:        Patient has prior history of Echocardiogram examinations, most                 recent 06/03/2021. CHF, Stroke and CKD; Risk Factors:Dyslipidemia                 and Hypertension.  Sonographer:    Milbert Coulter Referring Phys: [id number] TIMOTHY S OPYD  Sonographer Comments: Image acquisition challenging due to respiratory motion and Image acquisition challenging due to patient body habitus. IMPRESSIONS  1. Left ventricular ejection fraction, by estimation, is 25 to 30%. The left ventricle has severely decreased function. Left ventricular endocardial border not optimally defined to evaluate regional wall motion. There is moderate concentric left ventricular hypertrophy. Left ventricular diastolic parameters are consistent with Grade II diastolic  dysfunction (pseudonormalization).  2. Aortic valve appears bicuspid and is severely calcified. There is severely restricted leaflet mobility. Vmax 2.8 m/s, MG 18 mmHG, AVA 0.95 cm2, DI 0.25. Findings are concerning for severe low flow low gradient aortic stenosis. The aortic valve is bicuspid. There is severe calcifcation of the aortic valve. There is severe thickening of the aortic valve. Aortic valve regurgitation is not visualized. Severe aortic valve stenosis.  3. Right ventricular systolic function is normal. The right ventricular size is mildly enlarged. There is mildly elevated pulmonary artery systolic pressure. The estimated right ventricular systolic pressure is 36.6 mmHg.  4. Left atrial size was severely dilated.  5. The mitral valve is degenerative. Mild mitral valve regurgitation. No evidence of mitral stenosis. Moderate mitral annular calcification.  6. The inferior vena cava is normal in size with greater than 50% respiratory variability, suggesting right atrial pressure of 3 mmHg.  7. Aortic dilatation noted. There is mild dilatation of the aortic root, measuring 41 mm. Comparison(s): Changes from prior study are noted. The left ventricular function is significantly worse. Aortic stenosis appears to be severe low flow low gradient AS. FINDINGS  Left Ventricle: Left ventricular ejection fraction, by estimation, is 25 to 30%. The left ventricle has severely decreased function. Left ventricular endocardial border not optimally  able to cross the aortic valve via the radial access.  Was not able to reach the aortic root via femoral access. 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 PLAN OF CARE:  Patient will return to his nursing unit.  The plan will be to gently hydrate and perform CT angiogram of the aorta and coronary arteries in order to fully assess both.  He does not seem to be a very stable candidate for femoral access TAVR. 3 Bryan Lemma, MD   ECHOCARDIOGRAM COMPLETE  Result Date: 12/25/2022    ECHOCARDIOGRAM REPORT   Patient Name:   Allen Torres Date of Exam: 12/25/2022 Medical Rec #:  [id number]     Height:       69.0 in Accession #:    [id number]    Weight:       208.0 lb Date of Birth:  March 22, 1941    BSA:          2.101 m Patient Age:    81 years      BP:           131/86 mmHg Patient Gender: M             HR:           74 bpm. Exam Location:  Inpatient Procedure: 2D Echo, Color Doppler and Cardiac Doppler Indications:    CHF, Elevated Troponin  History:        Patient has prior history of Echocardiogram examinations, most                 recent 06/03/2021. CHF, Stroke and CKD; Risk Factors:Dyslipidemia                 and Hypertension.  Sonographer:    Milbert Coulter Referring Phys: [id number] TIMOTHY S OPYD  Sonographer Comments: Image acquisition challenging due to respiratory motion and Image acquisition challenging due to patient body habitus. IMPRESSIONS  1. Left ventricular ejection fraction, by estimation, is 25 to 30%. The left ventricle has severely decreased function. Left ventricular endocardial border not optimally defined to evaluate regional wall motion. There is moderate concentric left ventricular hypertrophy. Left ventricular diastolic parameters are consistent with Grade II diastolic  dysfunction (pseudonormalization).  2. Aortic valve appears bicuspid and is severely calcified. There is severely restricted leaflet mobility. Vmax 2.8 m/s, MG 18 mmHG, AVA 0.95 cm2, DI 0.25. Findings are concerning for severe low flow low gradient aortic stenosis. The aortic valve is bicuspid. There is severe calcifcation of the aortic valve. There is severe thickening of the aortic valve. Aortic valve regurgitation is not visualized. Severe aortic valve stenosis.  3. Right ventricular systolic function is normal. The right ventricular size is mildly enlarged. There is mildly elevated pulmonary artery systolic pressure. The estimated right ventricular systolic pressure is 36.6 mmHg.  4. Left atrial size was severely dilated.  5. The mitral valve is degenerative. Mild mitral valve regurgitation. No evidence of mitral stenosis. Moderate mitral annular calcification.  6. The inferior vena cava is normal in size with greater than 50% respiratory variability, suggesting right atrial pressure of 3 mmHg.  7. Aortic dilatation noted. There is mild dilatation of the aortic root, measuring 41 mm. Comparison(s): Changes from prior study are noted. The left ventricular function is significantly worse. Aortic stenosis appears to be severe low flow low gradient AS. FINDINGS  Left Ventricle: Left ventricular ejection fraction, by estimation, is 25 to 30%. The left ventricle has severely decreased function. Left ventricular endocardial border not optimally  Physician Discharge Summary  Allen Torres UJW:[id number] DOB: [birth year] DOA: 12/24/2022  PCP: Laurena Bering, DO  Admit date: 12/24/2022 Discharge date: 12/28/2022  Time spent: 35 minutes  Recommendations for Outpatient Follow-up:  Home with hospice services   Discharge Diagnoses:  Principal Problem:   Acute Systolic CHF`   Severe aortic stenosis   Essential hypertension   History of stroke   Stage 3a chronic kidney disease (HCC)   Mixed hyperlipidemia   Thoracic ascending aortic aneurysm (HCC)   Acute HFrEF (heart failure with reduced ejection fraction) (HCC) DO NOT RESUSCITATE  Discharge Condition: Guarded prognosis  Diet recommendation: Stable  Filed Weights   12/26/22 0510 12/27/22 0350 12/28/22 0619  Weight: 95.2 kg 92.6 kg 91.7 kg    History of present illness:  81/M with history of hypertension, CVA, CKD 3a to the ED with with dyspnea on exertion x1 week -In the ED noted to be hypoxic, last x-ray noted no effusions, CTA chest negative for PE above for pulmonary edema lateral pleural effusions, creat 1.49, BNP 1660 and troponin 1916 -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 Excell Seltzer and family today about poor prognosis -I  recommended discharge home with hospice, patient was agreeable   Severe aortic stenosis -See discussion above -Not a TAVR candidate, palliative care/hospice recommended    Elevated troponin  -Suspect recent NSTEMI, was on IV heparin X48 hours, unsuccessful attempts at radial and femoral cath yesterday -Discontinued IV heparin -Coronary CTA completed, see discussion above   CKD 3A  -Creatinine slightly higher, after CTAs, holding diuretics   Chronic anemia Stable, monitor   Hx of CVA  - Continue ASA     Thoracic aortic aneurysm  - 4.7 cm ascending thoracic aortic aneurysm noted on CT in ED  - Outpatient follow-up recommended     Mild chronic anemia -Stable, monitor      Discharge Exam: Vitals:   12/28/22 0705 12/28/22 1203  BP: (!) 140/79 (!) 140/77  Pulse: 94 92  Resp: 17 17  Temp: 99.3 F (37.4 C)   SpO2: 94% 95%   GEN: No acute distress.   Neck: No JVD Cardiac: RRR, 2/6 systolic murmur  Respiratory: Clear to auscultation bilaterally. GI: Soft, nontender, non-distended  MS: No edema  Discharge Instructions   Discharge Instructions     AMB referral to Phase II Cardiac Rehabilitation   Complete by: As directed    May not be a candidate   Diagnosis: NSTEMI   After initial evaluation and assessments completed: Virtual Based Care may be provided alone or in conjunction with Phase 2 Cardiac Rehab based on patient barriers.: Yes   Intensive Cardiac Rehabilitation (ICR) [facility] location only OR Traditional Cardiac Rehabilitation (TCR) *If criteria for ICR are not met will enroll in TCR [facility] only): Yes   Diet - low sodium heart  able to cross the aortic valve via the radial access.  Was not able to reach the aortic root via femoral access. 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 PLAN OF CARE:  Patient will return to his nursing unit.  The plan will be to gently hydrate and perform CT angiogram of the aorta and coronary arteries in order to fully assess both.  He does not seem to be a very stable candidate for femoral access TAVR. 3 Bryan Lemma, MD   ECHOCARDIOGRAM COMPLETE  Result Date: 12/25/2022    ECHOCARDIOGRAM REPORT   Patient Name:   Allen Torres Date of Exam: 12/25/2022 Medical Rec #:  [id number]     Height:       69.0 in Accession #:    [id number]    Weight:       208.0 lb Date of Birth:  March 22, 1941    BSA:          2.101 m Patient Age:    81 years      BP:           131/86 mmHg Patient Gender: M             HR:           74 bpm. Exam Location:  Inpatient Procedure: 2D Echo, Color Doppler and Cardiac Doppler Indications:    CHF, Elevated Troponin  History:        Patient has prior history of Echocardiogram examinations, most                 recent 06/03/2021. CHF, Stroke and CKD; Risk Factors:Dyslipidemia                 and Hypertension.  Sonographer:    Milbert Coulter Referring Phys: [id number] TIMOTHY S OPYD  Sonographer Comments: Image acquisition challenging due to respiratory motion and Image acquisition challenging due to patient body habitus. IMPRESSIONS  1. Left ventricular ejection fraction, by estimation, is 25 to 30%. The left ventricle has severely decreased function. Left ventricular endocardial border not optimally defined to evaluate regional wall motion. There is moderate concentric left ventricular hypertrophy. Left ventricular diastolic parameters are consistent with Grade II diastolic  dysfunction (pseudonormalization).  2. Aortic valve appears bicuspid and is severely calcified. There is severely restricted leaflet mobility. Vmax 2.8 m/s, MG 18 mmHG, AVA 0.95 cm2, DI 0.25. Findings are concerning for severe low flow low gradient aortic stenosis. The aortic valve is bicuspid. There is severe calcifcation of the aortic valve. There is severe thickening of the aortic valve. Aortic valve regurgitation is not visualized. Severe aortic valve stenosis.  3. Right ventricular systolic function is normal. The right ventricular size is mildly enlarged. There is mildly elevated pulmonary artery systolic pressure. The estimated right ventricular systolic pressure is 36.6 mmHg.  4. Left atrial size was severely dilated.  5. The mitral valve is degenerative. Mild mitral valve regurgitation. No evidence of mitral stenosis. Moderate mitral annular calcification.  6. The inferior vena cava is normal in size with greater than 50% respiratory variability, suggesting right atrial pressure of 3 mmHg.  7. Aortic dilatation noted. There is mild dilatation of the aortic root, measuring 41 mm. Comparison(s): Changes from prior study are noted. The left ventricular function is significantly worse. Aortic stenosis appears to be severe low flow low gradient AS. FINDINGS  Left Ventricle: Left ventricular ejection fraction, by estimation, is 25 to 30%. The left ventricle has severely decreased function. Left ventricular endocardial border not optimally  Physician Discharge Summary  Allen Torres UJW:[id number] DOB: [birth year] DOA: 12/24/2022  PCP: Laurena Bering, DO  Admit date: 12/24/2022 Discharge date: 12/28/2022  Time spent: 35 minutes  Recommendations for Outpatient Follow-up:  Home with hospice services   Discharge Diagnoses:  Principal Problem:   Acute Systolic CHF`   Severe aortic stenosis   Essential hypertension   History of stroke   Stage 3a chronic kidney disease (HCC)   Mixed hyperlipidemia   Thoracic ascending aortic aneurysm (HCC)   Acute HFrEF (heart failure with reduced ejection fraction) (HCC) DO NOT RESUSCITATE  Discharge Condition: Guarded prognosis  Diet recommendation: Stable  Filed Weights   12/26/22 0510 12/27/22 0350 12/28/22 0619  Weight: 95.2 kg 92.6 kg 91.7 kg    History of present illness:  81/M with history of hypertension, CVA, CKD 3a to the ED with with dyspnea on exertion x1 week -In the ED noted to be hypoxic, last x-ray noted no effusions, CTA chest negative for PE above for pulmonary edema lateral pleural effusions, creat 1.49, BNP 1660 and troponin 1916 -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 Excell Seltzer and family today about poor prognosis -I  recommended discharge home with hospice, patient was agreeable   Severe aortic stenosis -See discussion above -Not a TAVR candidate, palliative care/hospice recommended    Elevated troponin  -Suspect recent NSTEMI, was on IV heparin X48 hours, unsuccessful attempts at radial and femoral cath yesterday -Discontinued IV heparin -Coronary CTA completed, see discussion above   CKD 3A  -Creatinine slightly higher, after CTAs, holding diuretics   Chronic anemia Stable, monitor   Hx of CVA  - Continue ASA     Thoracic aortic aneurysm  - 4.7 cm ascending thoracic aortic aneurysm noted on CT in ED  - Outpatient follow-up recommended     Mild chronic anemia -Stable, monitor      Discharge Exam: Vitals:   12/28/22 0705 12/28/22 1203  BP: (!) 140/79 (!) 140/77  Pulse: 94 92  Resp: 17 17  Temp: 99.3 F (37.4 C)   SpO2: 94% 95%   GEN: No acute distress.   Neck: No JVD Cardiac: RRR, 2/6 systolic murmur  Respiratory: Clear to auscultation bilaterally. GI: Soft, nontender, non-distended  MS: No edema  Discharge Instructions   Discharge Instructions     AMB referral to Phase II Cardiac Rehabilitation   Complete by: As directed    May not be a candidate   Diagnosis: NSTEMI   After initial evaluation and assessments completed: Virtual Based Care may be provided alone or in conjunction with Phase 2 Cardiac Rehab based on patient barriers.: Yes   Intensive Cardiac Rehabilitation (ICR) [facility] location only OR Traditional Cardiac Rehabilitation (TCR) *If criteria for ICR are not met will enroll in TCR [facility] only): Yes   Diet - low sodium heart  Physician Discharge Summary  Allen Torres UJW:[id number] DOB: [birth year] DOA: 12/24/2022  PCP: Laurena Bering, DO  Admit date: 12/24/2022 Discharge date: 12/28/2022  Time spent: 35 minutes  Recommendations for Outpatient Follow-up:  Home with hospice services   Discharge Diagnoses:  Principal Problem:   Acute Systolic CHF`   Severe aortic stenosis   Essential hypertension   History of stroke   Stage 3a chronic kidney disease (HCC)   Mixed hyperlipidemia   Thoracic ascending aortic aneurysm (HCC)   Acute HFrEF (heart failure with reduced ejection fraction) (HCC) DO NOT RESUSCITATE  Discharge Condition: Guarded prognosis  Diet recommendation: Stable  Filed Weights   12/26/22 0510 12/27/22 0350 12/28/22 0619  Weight: 95.2 kg 92.6 kg 91.7 kg    History of present illness:  81/M with history of hypertension, CVA, CKD 3a to the ED with with dyspnea on exertion x1 week -In the ED noted to be hypoxic, last x-ray noted no effusions, CTA chest negative for PE above for pulmonary edema lateral pleural effusions, creat 1.49, BNP 1660 and troponin 1916 -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 Excell Seltzer and family today about poor prognosis -I  recommended discharge home with hospice, patient was agreeable   Severe aortic stenosis -See discussion above -Not a TAVR candidate, palliative care/hospice recommended    Elevated troponin  -Suspect recent NSTEMI, was on IV heparin X48 hours, unsuccessful attempts at radial and femoral cath yesterday -Discontinued IV heparin -Coronary CTA completed, see discussion above   CKD 3A  -Creatinine slightly higher, after CTAs, holding diuretics   Chronic anemia Stable, monitor   Hx of CVA  - Continue ASA     Thoracic aortic aneurysm  - 4.7 cm ascending thoracic aortic aneurysm noted on CT in ED  - Outpatient follow-up recommended     Mild chronic anemia -Stable, monitor      Discharge Exam: Vitals:   12/28/22 0705 12/28/22 1203  BP: (!) 140/79 (!) 140/77  Pulse: 94 92  Resp: 17 17  Temp: 99.3 F (37.4 C)   SpO2: 94% 95%   GEN: No acute distress.   Neck: No JVD Cardiac: RRR, 2/6 systolic murmur  Respiratory: Clear to auscultation bilaterally. GI: Soft, nontender, non-distended  MS: No edema  Discharge Instructions   Discharge Instructions     AMB referral to Phase II Cardiac Rehabilitation   Complete by: As directed    May not be a candidate   Diagnosis: NSTEMI   After initial evaluation and assessments completed: Virtual Based Care may be provided alone or in conjunction with Phase 2 Cardiac Rehab based on patient barriers.: Yes   Intensive Cardiac Rehabilitation (ICR) [facility] location only OR Traditional Cardiac Rehabilitation (TCR) *If criteria for ICR are not met will enroll in TCR [facility] only): Yes   Diet - low sodium heart  Physician Discharge Summary  Allen Torres UJW:[id number] DOB: [birth year] DOA: 12/24/2022  PCP: Laurena Bering, DO  Admit date: 12/24/2022 Discharge date: 12/28/2022  Time spent: 35 minutes  Recommendations for Outpatient Follow-up:  Home with hospice services   Discharge Diagnoses:  Principal Problem:   Acute Systolic CHF`   Severe aortic stenosis   Essential hypertension   History of stroke   Stage 3a chronic kidney disease (HCC)   Mixed hyperlipidemia   Thoracic ascending aortic aneurysm (HCC)   Acute HFrEF (heart failure with reduced ejection fraction) (HCC) DO NOT RESUSCITATE  Discharge Condition: Guarded prognosis  Diet recommendation: Stable  Filed Weights   12/26/22 0510 12/27/22 0350 12/28/22 0619  Weight: 95.2 kg 92.6 kg 91.7 kg    History of present illness:  81/M with history of hypertension, CVA, CKD 3a to the ED with with dyspnea on exertion x1 week -In the ED noted to be hypoxic, last x-ray noted no effusions, CTA chest negative for PE above for pulmonary edema lateral pleural effusions, creat 1.49, BNP 1660 and troponin 1916 -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 Excell Seltzer and family today about poor prognosis -I  recommended discharge home with hospice, patient was agreeable   Severe aortic stenosis -See discussion above -Not a TAVR candidate, palliative care/hospice recommended    Elevated troponin  -Suspect recent NSTEMI, was on IV heparin X48 hours, unsuccessful attempts at radial and femoral cath yesterday -Discontinued IV heparin -Coronary CTA completed, see discussion above   CKD 3A  -Creatinine slightly higher, after CTAs, holding diuretics   Chronic anemia Stable, monitor   Hx of CVA  - Continue ASA     Thoracic aortic aneurysm  - 4.7 cm ascending thoracic aortic aneurysm noted on CT in ED  - Outpatient follow-up recommended     Mild chronic anemia -Stable, monitor      Discharge Exam: Vitals:   12/28/22 0705 12/28/22 1203  BP: (!) 140/79 (!) 140/77  Pulse: 94 92  Resp: 17 17  Temp: 99.3 F (37.4 C)   SpO2: 94% 95%   GEN: No acute distress.   Neck: No JVD Cardiac: RRR, 2/6 systolic murmur  Respiratory: Clear to auscultation bilaterally. GI: Soft, nontender, non-distended  MS: No edema  Discharge Instructions   Discharge Instructions     AMB referral to Phase II Cardiac Rehabilitation   Complete by: As directed    May not be a candidate   Diagnosis: NSTEMI   After initial evaluation and assessments completed: Virtual Based Care may be provided alone or in conjunction with Phase 2 Cardiac Rehab based on patient barriers.: Yes   Intensive Cardiac Rehabilitation (ICR) [facility] location only OR Traditional Cardiac Rehabilitation (TCR) *If criteria for ICR are not met will enroll in TCR [facility] only): Yes   Diet - low sodium heart  able to cross the aortic valve via the radial access.  Was not able to reach the aortic root via femoral access. 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 PLAN OF CARE:  Patient will return to his nursing unit.  The plan will be to gently hydrate and perform CT angiogram of the aorta and coronary arteries in order to fully assess both.  He does not seem to be a very stable candidate for femoral access TAVR. 3 Bryan Lemma, MD   ECHOCARDIOGRAM COMPLETE  Result Date: 12/25/2022    ECHOCARDIOGRAM REPORT   Patient Name:   Allen Torres Date of Exam: 12/25/2022 Medical Rec #:  [id number]     Height:       69.0 in Accession #:    [id number]    Weight:       208.0 lb Date of Birth:  March 22, 1941    BSA:          2.101 m Patient Age:    81 years      BP:           131/86 mmHg Patient Gender: M             HR:           74 bpm. Exam Location:  Inpatient Procedure: 2D Echo, Color Doppler and Cardiac Doppler Indications:    CHF, Elevated Troponin  History:        Patient has prior history of Echocardiogram examinations, most                 recent 06/03/2021. CHF, Stroke and CKD; Risk Factors:Dyslipidemia                 and Hypertension.  Sonographer:    Milbert Coulter Referring Phys: [id number] TIMOTHY S OPYD  Sonographer Comments: Image acquisition challenging due to respiratory motion and Image acquisition challenging due to patient body habitus. IMPRESSIONS  1. Left ventricular ejection fraction, by estimation, is 25 to 30%. The left ventricle has severely decreased function. Left ventricular endocardial border not optimally defined to evaluate regional wall motion. There is moderate concentric left ventricular hypertrophy. Left ventricular diastolic parameters are consistent with Grade II diastolic  dysfunction (pseudonormalization).  2. Aortic valve appears bicuspid and is severely calcified. There is severely restricted leaflet mobility. Vmax 2.8 m/s, MG 18 mmHG, AVA 0.95 cm2, DI 0.25. Findings are concerning for severe low flow low gradient aortic stenosis. The aortic valve is bicuspid. There is severe calcifcation of the aortic valve. There is severe thickening of the aortic valve. Aortic valve regurgitation is not visualized. Severe aortic valve stenosis.  3. Right ventricular systolic function is normal. The right ventricular size is mildly enlarged. There is mildly elevated pulmonary artery systolic pressure. The estimated right ventricular systolic pressure is 36.6 mmHg.  4. Left atrial size was severely dilated.  5. The mitral valve is degenerative. Mild mitral valve regurgitation. No evidence of mitral stenosis. Moderate mitral annular calcification.  6. The inferior vena cava is normal in size with greater than 50% respiratory variability, suggesting right atrial pressure of 3 mmHg.  7. Aortic dilatation noted. There is mild dilatation of the aortic root, measuring 41 mm. Comparison(s): Changes from prior study are noted. The left ventricular function is significantly worse. Aortic stenosis appears to be severe low flow low gradient AS. FINDINGS  Left Ventricle: Left ventricular ejection fraction, by estimation, is 25 to 30%. The left ventricle has severely decreased function. Left ventricular endocardial border not optimally  Physician Discharge Summary  Allen Torres UJW:[id number] DOB: [birth year] DOA: 12/24/2022  PCP: Laurena Bering, DO  Admit date: 12/24/2022 Discharge date: 12/28/2022  Time spent: 35 minutes  Recommendations for Outpatient Follow-up:  Home with hospice services   Discharge Diagnoses:  Principal Problem:   Acute Systolic CHF`   Severe aortic stenosis   Essential hypertension   History of stroke   Stage 3a chronic kidney disease (HCC)   Mixed hyperlipidemia   Thoracic ascending aortic aneurysm (HCC)   Acute HFrEF (heart failure with reduced ejection fraction) (HCC) DO NOT RESUSCITATE  Discharge Condition: Guarded prognosis  Diet recommendation: Stable  Filed Weights   12/26/22 0510 12/27/22 0350 12/28/22 0619  Weight: 95.2 kg 92.6 kg 91.7 kg    History of present illness:  81/M with history of hypertension, CVA, CKD 3a to the ED with with dyspnea on exertion x1 week -In the ED noted to be hypoxic, last x-ray noted no effusions, CTA chest negative for PE above for pulmonary edema lateral pleural effusions, creat 1.49, BNP 1660 and troponin 1916 -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 Excell Seltzer and family today about poor prognosis -I  recommended discharge home with hospice, patient was agreeable   Severe aortic stenosis -See discussion above -Not a TAVR candidate, palliative care/hospice recommended    Elevated troponin  -Suspect recent NSTEMI, was on IV heparin X48 hours, unsuccessful attempts at radial and femoral cath yesterday -Discontinued IV heparin -Coronary CTA completed, see discussion above   CKD 3A  -Creatinine slightly higher, after CTAs, holding diuretics   Chronic anemia Stable, monitor   Hx of CVA  - Continue ASA     Thoracic aortic aneurysm  - 4.7 cm ascending thoracic aortic aneurysm noted on CT in ED  - Outpatient follow-up recommended     Mild chronic anemia -Stable, monitor      Discharge Exam: Vitals:   12/28/22 0705 12/28/22 1203  BP: (!) 140/79 (!) 140/77  Pulse: 94 92  Resp: 17 17  Temp: 99.3 F (37.4 C)   SpO2: 94% 95%   GEN: No acute distress.   Neck: No JVD Cardiac: RRR, 2/6 systolic murmur  Respiratory: Clear to auscultation bilaterally. GI: Soft, nontender, non-distended  MS: No edema  Discharge Instructions   Discharge Instructions     AMB referral to Phase II Cardiac Rehabilitation   Complete by: As directed    May not be a candidate   Diagnosis: NSTEMI   After initial evaluation and assessments completed: Virtual Based Care may be provided alone or in conjunction with Phase 2 Cardiac Rehab based on patient barriers.: Yes   Intensive Cardiac Rehabilitation (ICR) [facility] location only OR Traditional Cardiac Rehabilitation (TCR) *If criteria for ICR are not met will enroll in TCR [facility] only): Yes   Diet - low sodium heart

## 2022-12-28 NOTE — Progress Notes (Signed)
MD, pt is refusing labs this morning, does not want to get stuck anymore, will continue to monitor, Thanks Lavonda Jumbo RN.

## 2022-12-28 NOTE — Progress Notes (Signed)
   12/27/22 2105  BiPAP/CPAP/SIPAP  BiPAP/CPAP/SIPAP Pt Type Adult  Reason BIPAP/CPAP not in use Non-compliant (Refused)  BiPAP/CPAP /SiPAP Vitals  SpO2 93 %

## 2022-12-28 NOTE — Progress Notes (Signed)
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 -9/11, unsuccessful attempts at radial and femoral left heart cath -9/12, coronary and TAVR CT scans  Subjective: -Feels okay, anxious to go home  Assessment and Plan:  Acute systolic CHF -New diagnosis, echo with EF of 25-30%, normal RV and severe aortic stenosis -Cards consulting,  -Volume status improving, urine output inaccurate, weight down 11 LB -Cards following, unsuccessful attempts at radial and femoral cath 9/11 -Underwent coronary CTA air and pre-TAVR scans yesterday -Await cardiology input -Discussed potential need for palliative care with family if TAVR is not an option  Severe aortic stenosis -See discussion above   Elevated troponin  -Suspect recent NSTEMI, was on IV heparin X48 hours, unsuccessful attempts at radial and femoral cath yesterday -Discontinued IV heparin -Coronary CTA completed, await report   CKD 3A  -Creatinine slightly higher, after CTAs, holding diuretics  Chronic anemia Stable, monitor   Hx of CVA  - Continue ASA     Thoracic aortic aneurysm  - 4.7 cm ascending thoracic aortic aneurysm noted on CT in ED  - Outpatient follow-up recommended    Mild chronic anemia -Stable, monitor   DVT prophylaxis: lovenox Code Status: DNR Family Communication: Daughter at bedside Disposition Plan: Home later today or in a.m.  Consultants:    Procedures:   Antimicrobials:    Objective: Vitals:   12/27/22 2105 12/28/22 0021 12/28/22 0619 12/28/22 0705  BP:  (!) 131/91 (!) 141/69 (!) 140/79  Pulse:  85 86 94   Resp:  18 18 17   Temp:  98.1 F (36.7 C) 98.2 F (36.8 C) 99.3 F (37.4 C)  TempSrc:  Oral Oral Oral  SpO2: 93% 90% 92% 94%  Weight:   91.7 kg   Height:        Intake/Output Summary (Last 24 hours) at 12/28/2022 1059 Last data filed at 12/28/2022 0854 Gross per 24 hour  Intake 713 ml  Output 100 ml  Net 613 ml   Filed Weights   12/26/22 0510 12/27/22 0350 12/28/22 0619  Weight: 95.2 kg 92.6 kg 91.7 kg    Examination:  General exam: Pleasant elderly male sitting up in bed,  HEENT: positive JVD CVS: S1-S2, rate loud systolic murmur decreased breath sounds at the bases Abdomen: Soft, obese, nontender, bowel sounds present  Ext: trace edema Neuro:  residual left-sided weakness facial droop Psychiatry:  Mood & affect appropriate.     Data Reviewed:   CBC: Recent Labs  Lab 12/24/22 2136 12/25/22 0216 12/25/22 0620 12/26/22 0332 12/26/22 1526 12/26/22 1534 12/26/22 1535 12/26/22 1903 12/27/22 0318  WBC 7.8  --  6.8 6.4  --   --   --  6.3 6.4  NEUTROABS 5.2  --   --   --   --   --   --   --   --   HGB 11.8*   < > 10.2* 10.6* 10.5* 10.9* 10.9* 11.6* 10.8*  HCT 34.5*   < > 30.2* 31.4* 31.0* 32.0* 32.0* 33.3* 32.0*  MCV 98.6  --  96.8 94.9  --   --   --  97.4 95.5  PLT 161  --  156 170  --   --   --  176 182   < > = values in this interval not displayed.   Basic Metabolic Panel: Recent Labs  Lab 12/24/22 2136 12/25/22 0216 12/25/22 0620 12/26/22 0332 12/26/22 1526 12/26/22 1534 12/26/22 1535 12/26/22 1903 12/27/22 0318 12/28/22 0431  NA 133*   < > 133* 134* 135 136 136  --  135 132*  K 4.6   < > 4.1 3.7 4.2 4.2 4.2  --  4.0 4.5  CL 101  --  100 101  --   --   --   --  101 99  CO2 24  --  21* 24  --   --   --   --  21* 17*  GLUCOSE 130*  --  104* 94  --   --   --   --  91 100*  BUN 22  --  24* 24*  --   --   --   --  29* 38*  CREATININE 1.49*  --  1.43* 1.44*  --   --   --  1.57* 1.68* 1.82*  CALCIUM 8.6*  --  8.4* 8.5*  --   --   --   --  8.9 8.4*   MG  --   --   --  2.0  --   --   --   --   --   --    < > = values in this interval not displayed.   GFR: Estimated Creatinine Clearance: 35 mL/min (A) (by C-G formula based on SCr of 1.82 mg/dL (H)). Liver Function Tests: Recent Labs  Lab 12/24/22 2136  AST 31  ALT 17  ALKPHOS 59  BILITOT 1.1  PROT 5.9*  ALBUMIN 3.4*   No results for input(s): "LIPASE", "AMYLASE" in the last 168 hours. No results for input(s): "AMMONIA" in the last 168 hours. Coagulation Profile: No results for input(s): "INR", "PROTIME" in the last 168 hours. Cardiac Enzymes: No results for input(s): "CKTOTAL", "CKMB", "CKMBINDEX", "TROPONINI" in the last 168 hours. BNP (last 3 results) No results for input(s): "PROBNP" in the last 8760 hours. HbA1C: No results for input(s): "HGBA1C" in the last 72 hours. CBG: No results for input(s): "GLUCAP" in the last 168 hours. Lipid Profile: Recent Labs    12/27/22 0318  CHOL 231*  HDL 34*  LDLCALC 167*  TRIG 148  CHOLHDL 6.8   Thyroid Function Tests: No results for input(s): "TSH", "T4TOTAL", "FREET4", "T3FREE", "THYROIDAB" in the last 72 hours. Anemia Panel: No results for input(s): "VITAMINB12", "FOLATE", "FERRITIN", "TIBC", "IRON", "RETICCTPCT" in the last 72 hours. Urine analysis:    Component Value Date/Time   COLORURINE YELLOW 06/01/2021 2145   APPEARANCEUR CLEAR 06/01/2021 2145   LABSPEC 1.011 06/01/2021 2145   PHURINE 5.0 06/01/2021 2145   GLUCOSEU NEGATIVE 06/01/2021 2145   HGBUR NEGATIVE 06/01/2021 2145   BILIRUBINUR NEGATIVE 06/01/2021 2145   KETONESUR NEGATIVE 06/01/2021 2145   PROTEINUR NEGATIVE 06/01/2021 2145   NITRITE NEGATIVE 06/01/2021 2145   LEUKOCYTESUR NEGATIVE 06/01/2021 2145   Sepsis Labs: @LABRCNTIP (procalcitonin:4,lacticidven:4)  ) Recent Results (from the past 240 hour(s))  SARS Coronavirus 2 by RT PCR (hospital order, performed in [facility] hospital lab) *cepheid single result test* Anterior Nasal Swab     Status:  None   Collection Time: 12/24/22  9:46 PM   Specimen: Anterior Nasal Swab  Result Value Ref Range Status   SARS  Coronavirus 2 by RT PCR NEGATIVE NEGATIVE Final    Comment: Performed at [facility], [address] [place], [place] [address]     Radiology Studies: CT Angio Abd/Pel w/ and/or w/o  Result Date: 12/27/2022 CLINICAL DATA:  Preop evaluation for aortic valve replacement EXAM: CT ANGIOGRAPHY CHEST, ABDOMEN AND PELVIS TECHNIQUE: Non-contrast CT of the chest was initially obtained. Multidetector CT imaging through the chest, abdomen and pelvis was performed using the standard protocol during bolus administration of intravenous contrast. Multiplanar reconstructed images and MIPs were obtained and reviewed to evaluate the vascular anatomy. RADIATION DOSE REDUCTION: This exam was performed according to the departmental dose-optimization program which includes automated exposure control, adjustment of the mA and/or kV according to patient size and/or use of iterative reconstruction technique. CONTRAST:  95mL OMNIPAQUE IOHEXOL 350 MG/ML SOLN COMPARISON:  Chest CTA dated December 25, 2022; CT abdomen and pelvis dated March 16, 2021 FINDINGS: CTA CHEST FINDINGS Cardiovascular: Cardiomegaly. Trace pericardial effusion. Aortic valve thickening and calcifications. Ascending aortic aneurysm, measuring up to 4.5 cm. Left main and three-vessel coronary artery calcifications. Short-segment abrupt occlusion of proximal descending thoracic aorta (approximally 1.5 cm distal from the left subclavian artery). Extensive bronchial artery, spinal, and chest wall arterial collaterals. Area of dilation at the origin of a prominent spinal artery in the thoracic aorta measuring 1.7 cm. Standard three-vessel aortic arch with markedly tortuous vessels. Mild narrowing at the origin of the right brachiocephalic artery due to calcified plaque. Mediastinum/Nodes: Small hiatal hernia. Mild circumferential wall thickening  of the distal esophagus. Thyroid is unremarkable. No enlarged lymph nodes seen in the chest. Lungs/Pleura: Central airways are patent. Mild central predominant ground-glass opacities. Small right-greater-than-left pleural effusions with adjacent atelectasis. Musculoskeletal: No chest wall abnormality. No acute or significant osseous findings. CTA ABDOMEN AND PELVIS FINDINGS Hepatobiliary: No focal liver abnormality is seen. Status post cholecystectomy. No biliary dilatation. Pancreas: No pancreatic ductal dilatation or surrounding inflammatory changes. Cystic lesion of the pancreatic head measuring 14 x 10 mm on series 4, image 142 unchanged when compared with prior. Spleen: Normal in size without focal abnormality. Adrenals/Urinary Tract: Small right adrenal gland nodule measuring 9 mm on series 4, image 120, unchanged when compared with the prior and likely benign adrenal adenoma, no specific follow-up imaging is necessary. No hydronephrosis or nephrolithiasis. Bilateral simple appearing renal lesions, no specific follow-up imaging is necessary. No bladder wall thickening mild tethering of the anterior left bladder wall secondary to inguinal hernia. Stomach/Bowel: Stomach is within normal limits. Severe diverticulosis. No evidence of bowel wall thickening, distention, or inflammatory changes. Vascular/lymphatic: Normal caliber abdominal aorta with moderate atherosclerotic disease. No enlarged lymph nodes seen in the abdomen or pelvis. Reproductive: Prostate is unremarkable. Other: Moderate fat containing left inguinal hernia. Small fat containing paraumbilical hernia. No abdominopelvic ascites. Musculoskeletal: No acute or significant osseous findings. VASCULAR MEASUREMENTS PERTINENT TO TAVR: AORTA: Minimal Aortic Diameter - Short-segment abrupt occlusion of proximal descending thoracic aorta (approximally 1.5 cm distal from the left subclavian artery). Severity of Aortic Calcification-moderate RIGHT PELVIS: Right  Common Iliac Artery - Minimal Diameter-8.3 mm Tortuosity-mild Calcification-mild Right External Iliac Artery - Minimal Diameter-mild mm Tortuosity-6.7 Calcification-mild Right Common Femoral Artery - Minimal Diameter-5.6 mm Tortuosity-none Calcification-mild LEFT PELVIS: Left Common Iliac Artery - Minimal Diameter-7.6 mm Tortuosity-mild Calcification-moderate Left External Iliac Artery - Minimal Diameter-6.2 mm Tortuosity-none Calcification-none Left Common Femoral Artery - Minimal Diameter-7.4 mm Tortuosity-none Calcification-moderate Review of the MIP images confirms the above findings. IMPRESSION: Vascular: 1. Short-segment abrupt occlusion of the  proximal descending thoracic aorta with extensive arterial collaterals, findings are consistent with coarctation of the aorta. 2. Vascular findings and measurements pertinent to potential TAVR procedure, as detailed above. 3. Standard three-vessel aortic arch with markedly tortuous vessels. Mild narrowing at the origin of the right brachiocephalic artery due to calcified plaque. 4. Thickening and calcification of the aortic valve, compatible with reported clinical history of aortic stenosis. 5. Moderate aortoiliac atherosclerosis. 6. Area of dilation at the origin of a prominent spinal artery measuring 1.7 cm, likely an infundibulum. 7. Left main and 3 vessel coronary artery disease. 8. Ascending aortic aneurysm, measuring up to 4.5 cm. Ascending thoracic aortic aneurysm. Recommend semi-annual imaging followup by CTA or MRA and referral to cardiothoracic surgery if not already obtained. This recommendation follows 2010 ACCF/AHA/AATS/ACR/ASA/SCA/SCAI/SIR/STS/SVM Guidelines for the Diagnosis and Management of Patients With Thoracic Aortic Disease. Circulation. 2010; 121: B284-X324. Aortic aneurysm NOS (ICD10-I71.9) Nonvascular: 1. Pulmonary edema and small right-greater-than-left pleural effusions. 2. 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 Recommend follow up pre and post contrast MRI/MRCP in 2 years. 3. Small hiatal hernia with mild circumferential wall thickening of the distal esophagus, likely due to esophagitis. Electronically Signed   By: Allegra Lai M.D.   On: 12/27/2022 18:04   CT ANGIO CHEST AORTA W/CM & OR WO/CM  Result Date: 12/27/2022 CLINICAL DATA:  Preop evaluation for aortic valve replacement EXAM: CT ANGIOGRAPHY CHEST, ABDOMEN AND PELVIS TECHNIQUE: Non-contrast CT of the chest was initially obtained. Multidetector CT imaging through the chest, abdomen and pelvis was performed using the standard protocol during bolus administration of intravenous contrast. Multiplanar reconstructed images and MIPs were obtained and reviewed to evaluate the vascular anatomy. RADIATION DOSE REDUCTION: This exam was performed according to the departmental dose-optimization program which includes automated exposure control, adjustment of the mA and/or kV according to patient size and/or use of iterative reconstruction technique. CONTRAST:  95mL OMNIPAQUE IOHEXOL 350 MG/ML SOLN COMPARISON:  Chest CTA dated December 25, 2022; CT abdomen and pelvis dated March 16, 2021 FINDINGS: CTA CHEST FINDINGS Cardiovascular: Cardiomegaly. Trace pericardial effusion. Aortic valve thickening and calcifications. Ascending aortic aneurysm, measuring up to 4.5 cm. Left main and three-vessel coronary artery calcifications. Short-segment abrupt occlusion of proximal descending thoracic aorta (approximally 1.5 cm distal from the left subclavian artery). Extensive bronchial artery, spinal, and chest wall arterial collaterals. Area of dilation at the origin of a prominent spinal artery in the thoracic aorta measuring 1.7 cm. Standard three-vessel aortic arch with markedly tortuous vessels. Mild narrowing at the origin of the right brachiocephalic artery due to calcified plaque. Mediastinum/Nodes: Small hiatal hernia. Mild circumferential wall thickening  of the distal esophagus. Thyroid is unremarkable. No enlarged lymph nodes seen in the chest. Lungs/Pleura: Central airways are patent. Mild central predominant ground-glass opacities. Small right-greater-than-left pleural effusions with adjacent atelectasis. Musculoskeletal: No chest wall abnormality. No acute or significant osseous findings. CTA ABDOMEN AND PELVIS FINDINGS Hepatobiliary: No focal liver abnormality is seen. Status post cholecystectomy. No biliary dilatation. Pancreas: No pancreatic ductal dilatation or surrounding inflammatory changes. Cystic lesion of the pancreatic head measuring 14 x 10 mm on series 4, image 142 unchanged when compared with prior. Spleen: Normal in size without focal abnormality. Adrenals/Urinary Tract: Small right adrenal gland nodule measuring 9 mm on series 4, image 120, unchanged when compared with the prior and likely benign adrenal adenoma, no specific follow-up imaging is necessary. No hydronephrosis or nephrolithiasis. Bilateral simple appearing renal lesions, no specific  follow-up imaging is necessary. No bladder wall thickening mild tethering of the anterior left bladder wall secondary to inguinal hernia. Stomach/Bowel: Stomach is within normal limits. Severe diverticulosis. No evidence of bowel wall thickening, distention, or inflammatory changes. Vascular/lymphatic: Normal caliber abdominal aorta with moderate atherosclerotic disease. No enlarged lymph nodes seen in the abdomen or pelvis. Reproductive: Prostate is unremarkable. Other: Moderate fat containing left inguinal hernia. Small fat containing paraumbilical hernia. No abdominopelvic ascites. Musculoskeletal: No acute or significant osseous findings. VASCULAR MEASUREMENTS PERTINENT TO TAVR: AORTA: Minimal Aortic Diameter - Short-segment abrupt occlusion of proximal descending thoracic aorta (approximally 1.5 cm distal from the left subclavian artery). Severity of Aortic Calcification-moderate RIGHT PELVIS: Right  Common Iliac Artery - Minimal Diameter-8.3 mm Tortuosity-mild Calcification-mild Right External Iliac Artery - Minimal Diameter-mild mm Tortuosity-6.7 Calcification-mild Right Common Femoral Artery - Minimal Diameter-5.6 mm Tortuosity-none Calcification-mild LEFT PELVIS: Left Common Iliac Artery - Minimal Diameter-7.6 mm Tortuosity-mild Calcification-moderate Left External Iliac Artery - Minimal Diameter-6.2 mm Tortuosity-none Calcification-none Left Common Femoral Artery - Minimal Diameter-7.4 mm Tortuosity-none Calcification-moderate Review of the MIP images confirms the above findings. IMPRESSION: Vascular: 1. Short-segment abrupt occlusion of the proximal descending thoracic aorta with extensive arterial collaterals, findings are consistent with coarctation of the aorta. 2. Vascular findings and measurements pertinent to potential TAVR procedure, as detailed above. 3. Standard three-vessel aortic arch with markedly tortuous vessels. Mild narrowing at the origin of the right brachiocephalic artery due to calcified plaque. 4. Thickening and calcification of the aortic valve, compatible with reported clinical history of aortic stenosis. 5. Moderate aortoiliac atherosclerosis. 6. Area of dilation at the origin of a prominent spinal artery measuring 1.7 cm, likely an infundibulum. 7. Left main and 3 vessel coronary artery disease. 8. Ascending aortic aneurysm, measuring up to 4.5 cm. Ascending thoracic aortic aneurysm. Recommend semi-annual imaging followup by CTA or MRA and referral to cardiothoracic surgery if not already obtained. This recommendation follows 2010 ACCF/AHA/AATS/ACR/ASA/SCA/SCAI/SIR/STS/SVM Guidelines for the Diagnosis and Management of Patients With Thoracic Aortic Disease. Circulation. 2010; 121: E952-W413. Aortic aneurysm NOS (ICD10-I71.9) Nonvascular: 1. Pulmonary edema and small right-greater-than-left pleural effusions. 2. 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 Recommend follow up pre and post contrast MRI/MRCP in 2 years. 3. Small hiatal hernia with mild circumferential wall thickening of the distal esophagus, likely due to esophagitis. Electronically Signed   By: Allegra Lai M.D.   On: 12/27/2022 18:04   CT CORONARY MORPH W/CTA COR W/SCORE W/CA W/CM &/OR WO/CM  Addendum Date: 12/27/2022   ADDENDUM REPORT: 12/27/2022 16:39 EXAM: OVER-READ INTERPRETATION  CT CHEST The following report is an over-read performed by radiologist Dr. Jacob Moores [facility] on 12/27/2022. This over-read does not include interpretation of cardiac or coronary anatomy or pathology. The cardiac TAVR interpretation by the cardiologist is attached. COMPARISON:  None. FINDINGS: Extracardiac findings will be described separately under dictation for contemporaneously obtained CTA chest, abdomen and pelvis. IMPRESSION: Please see separate dictation for contemporaneously obtained CTA chest, abdomen and pelvis dated 12/27/2022 for full description of relevant extracardiac findings. Electronically Signed   By: Allegra Lai M.D.   On: 12/27/2022 16:39   Result Date: 12/27/2022 CLINICAL DATA:  69M with severe aortic stenosis being evaluated for a TAVR procedure. EXAM: Cardiac TAVR CT TECHNIQUE: The patient was scanned on a [employer]. A 120 kV retrospective scan was triggered in the descending thoracic aorta at 111 HU's. Gantry rotation speed was 250 msecs and collimation was .6  mm. No beta blockade or nitro were given. The 3D data set was reconstructed in 5% intervals of the R-R cycle. Systolic and diastolic phases were analyzed on a dedicated work station using MPR, MIP and VRT modes. The patient received 100 cc of contrast. 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 Aortic valve calcium score: 2573 Aortic annulus: Diameter: 29mm x 23mm Perimeter: 81mm Area: 512 mm^2 Calcifications: Mild  calcifications adjacent to left coronary cusp Coronary height: Min Left - 16mm, Min Right - 19mm Sinotubular height: Left cusp - 23mm; Right cusp - 25mm; Noncoronary cusp - 27mm LVOT (as measured 3 mm below the annulus): Diameter: 28mm x 24mm Area: 551 mm^2 Calcifications: Mild calcifications inferior to left coronary cusp Aortic sinus width: Left cusp - 37mm; Right cusp - 35mm; Noncoronary cusp - 40mm Sinotubular junction width: 38mm x 33mm Optimum Fluoroscopic Angle for Delivery: LAO 1 CAU 18 Cardiac: Right atrium: Normal size Right ventricle: Normal size Pulmonary arteries: Dilated main pulmonary artery measuring 30mm Pulmonary veins: Normal configuration Left atrium: Mild enlargement Left ventricle: Normal size Pericardium: Normal thickness Coronary arteries: NTG not administered to evaluate coronary arteries. Coronary calcium score 708 (61st percentile) Aorta: Ascending aortic aneurysm measuring 46mm IMPRESSION: 1. 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 Severe aortic valve calcifications (AV calcium score 2573) 2. Aortic annulus measures 29mm x 23mm in diameter with perimeter 81mm and area 512 mm^2. Mild annular calcifications adjacent to left coronary cusp and extending into LVOT. Annular measurements are suitable for placement of 26mm Edwards Sapien 3 valve. 3. Sufficient coronary to annulus distance. 4. Optimum Fluoroscopic Angle for Delivery:  LAO 1 CAU 18 5. Ascending aortic aneurysm measuring 46mm 6. Dilated main pulmonary artery measuring 30mm 7. Coronary calcium score 708 (61st percentile) Electronically Signed: By: Epifanio Lesches M.D. On: 12/27/2022 14:22   CARDIAC CATHETERIZATION  Result Date: 12/27/2022 Extremely tortuous right innominate and thoracic arch system, unable to reach the aortic root via right radial access Aneurysmal dilation of the thoracic aorta with extensive angulation making it impossible to reach the aortic and therefore the ascending aorta.   Aneurysmal dilation seen with thoracic aortography. Procedure aborted, although catheters were placed to attempt angiography, I was not able to engage either the right or left coronary artery, nor was able to cross the aortic valve via the radial access.  Was not able to reach the aortic root via femoral access. 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 PLAN OF CARE:  Patient will return to his nursing unit.  The plan will be to gently hydrate and perform CT angiogram of the aorta and coronary arteries in order to fully assess both.  He does not seem to be a very stable candidate for femoral access TAVR. 3 Bryan Lemma, MD   PERIPHERAL VASCULAR CATHETERIZATION  Result Date: 12/27/2022 Extremely tortuous right innominate and thoracic arch system, unable to reach the aortic root via right radial access Aneurysmal dilation of the thoracic aorta with extensive angulation making it impossible to reach the aortic and therefore the ascending aorta.  Aneurysmal dilation seen with thoracic aortography. Procedure aborted, although catheters were placed to attempt angiography, I was not able to engage either the right or left coronary artery, nor was able to cross the aortic valve via the radial access.  Was not able to reach the aortic root via femoral access. 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 PLAN OF CARE:  Patient will return to  his nursing unit.  The plan will be to gently hydrate and perform CT angiogram of the aorta and coronary arteries in order to fully assess both.  He does not seem to be a very stable candidate for femoral access TAVR. 3 Bryan Lemma, MD     Scheduled Meds:  clopidogrel  75 mg Oral Daily   doxazosin  4 mg Oral BID   enoxaparin (LOVENOX) injection  40 mg Subcutaneous Q24H   fluticasone  2 spray Each Nare Daily   loratadine  10 mg Oral Daily   LORazepam  0.5 mg Intravenous Once   sodium chloride flush  3 mL Intravenous Q12H    sodium chloride flush  3 mL Intravenous Q12H   Continuous Infusions:  sodium chloride       LOS: 3 days    Time spent:    Zannie Cove, MD [facility]   12/28/2022, 10:59 AM

## 2022-12-28 NOTE — TOC Transition Note (Signed)
Transition of Care [facility]) - CM/SW Discharge Note   Patient Details  Name: Allen Torres MRN: [id number] Date of Birth: 06-03-1940  Transition [facility] Care [facility]) CM/SW Contact:  Leone Haven, RN Phone Number: 12/28/2022, 1:22 PM   Clinical Narrative:    NCM notified by MD that patient is going home with hospice today, NCM spoke with patient offered choice, he wants [facility].  NCM made referral to [facility].  She is onsite and will come up to speak with patient also. Daughter is at the bedside and will transport patient home today.     Final next level of care: Home w Hospice Care Barriers to Discharge: No Barriers Identified   Patient Goals and CMS Choice CMS Medicare.gov Compare Post Acute Care list provided to:: Patient Choice offered to / list presented to : Patient  Discharge Placement                         Discharge Plan and Services Additional resources added to the After Visit Summary for   In-house Referral: NA Discharge Planning Services: CM Consult Post Acute Care Choice: NA          DME Arranged: N/A DME Agency: NA       HH Arranged: RN HH Agency: [facility] Date HH Agency Contacted: 12/28/22 Time HH Agency Contacted: 1322 Representative spoke with at [facility] Agency: Cheri  Social Determinants of Health (SDOH) Interventions SDOH Screenings   Food Insecurity: No Food Insecurity (12/25/2022)  Housing: Low Risk  (12/25/2022)  Transportation Needs: No Transportation Needs (12/25/2022)  Utilities: Not At Risk (12/25/2022)  Tobacco Use: Medium Risk (12/25/2022)     Readmission Risk Interventions     No data to display

## 2022-12-28 NOTE — Progress Notes (Signed)
Heart Failure Navigator Progress Note  Assessed for Heart & Vascular TOC clinic readiness.  Patient does not meet criteria due to TAVR work and per Dr. Jomarie Longs.   Navigator will sign off at this time.    Demeco Ducksworth,RN, BSN,MSN Heart Failure Nurse Navigator. Contact by secure chat only.

## 2023-01-01 LAB — LIPOPROTEIN A (LPA): Lipoprotein (a): 187.8 nmol/L — ABNORMAL HIGH

## 2023-01-02 DIAGNOSIS — I10 Essential (primary) hypertension: Secondary | ICD-10-CM | POA: Insufficient documentation

## 2023-01-02 DIAGNOSIS — N289 Disorder of kidney and ureter, unspecified: Secondary | ICD-10-CM | POA: Insufficient documentation

## 2023-01-03 NOTE — Progress Notes (Deleted)
Cardiology Office Note:  .   Date:  01/03/2023  ID:  Allen Torres, DOB 09-Jul-1940, MRN [id number] PCP: Laurena Bering, DO  [facility] Providers Cardiologist:  None { Click to update primary MD,subspecialty MD or APP then REFRESH:1}   History of Present Illness: .   Allen Torres is a 82 y.o. male dilatation of ascending aorta, severe aortic stenosis, BPH, CKD, history of stroke, dyslipidemia.  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  Cardiology was consulted he was taken for right left heart cath however his anatomy was not favorable and the procedure was aborted.  He underwent coronary CTA which revealed total occlusion of descending thoracic aorta up with severe tortuosity, TAVR not felt to be feasible.  The recommendations were for home with hospice services.  ROS: ROS  Studies Reviewed: .        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  Aneurysmal dilation seen with thoracic aortography. Procedure aborted, although catheters were placed to attempt angiography, I was not able to engage either the right or  left coronary artery, nor was able to cross the aortic valve via the radial access.  Was not able to reach the aortic root via femoral access. 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   PLAN OF CARE:  Patient will return to his nursing unit.  The plan will be to gently hydrate and perform CT angiogram of the aorta and coronary arteries in order to fully assess both.  He does not seem to be a very stable candidate for femoral access TAVR. 3   Bryan Lemma, MD  Findings Coronary Findings Diagnostic  Dominance: Right  Left Main Vessel was not visualized due to inability to cannulate.  Right Coronary Artery Vessel was not visualized due to inability to cannulate.  Intervention  No interventions have been documented.     ECHOCARDIOGRAM  ECHOCARDIOGRAM COMPLETE 12/25/2022  Narrative ECHOCARDIOGRAM REPORT    Patient Name:   Allen Torres Date of Exam: 12/25/2022 Medical Rec #:  [id number]     Height:       69.0 in Accession #:    [id number]    Weight:       208.0 lb Date of Birth:  08-17-1940    BSA:          2.101 m Patient Age:    81 years      BP:           131/86 mmHg Patient Gender: M             HR:           74 bpm. Exam Location:  Inpatient  Procedure: 2D Echo, Color Doppler and Cardiac Doppler  Indications:  CHF, Elevated Troponin  History:        Patient has prior history of Echocardiogram examinations, most recent 06/03/2021. CHF, Stroke and CKD; Risk Factors:Dyslipidemia and Hypertension.  Sonographer:    Milbert Coulter Referring Phys: [id number] TIMOTHY S OPYD   Sonographer Comments: Image acquisition challenging due to respiratory motion and Image acquisition challenging due to patient body habitus. IMPRESSIONS   1. Left ventricular ejection fraction, by estimation, is 25 to 30%. The left ventricle has severely decreased function. Left ventricular endocardial border not optimally defined to evaluate regional wall motion. There is  moderate concentric left ventricular hypertrophy. Left ventricular diastolic parameters are consistent with Grade II diastolic dysfunction (pseudonormalization). 2. Aortic valve appears bicuspid and is severely calcified. There is severely restricted leaflet mobility. Vmax 2.8 m/s, MG 18 mmHG, AVA 0.95 cm2, DI 0.25. Findings are concerning for severe low flow low gradient aortic stenosis. The aortic valve is bicuspid. There is severe calcifcation of the aortic valve. There is severe thickening of the aortic valve. Aortic valve regurgitation is not visualized. Severe aortic valve stenosis. 3. Right ventricular systolic function is normal. The right ventricular size is mildly enlarged. There is mildly elevated pulmonary artery systolic pressure. The estimated right ventricular systolic pressure is 36.6 mmHg. 4. Left atrial size was severely dilated. 5. The mitral valve is degenerative. Mild mitral valve regurgitation. No evidence of mitral stenosis. Moderate mitral annular calcification. 6. The inferior vena cava is normal in size with greater than 50% respiratory variability, suggesting right atrial pressure of 3 mmHg. 7. Aortic dilatation noted. There is mild dilatation of the aortic root, measuring 41 mm.  Comparison(s): Changes from prior study are noted. The left ventricular function is significantly worse. Aortic stenosis appears to be severe low flow low gradient AS.  FINDINGS Left Ventricle: Left ventricular ejection fraction, by estimation, is 25 to 30%. The left ventricle has severely decreased function. Left ventricular endocardial border not optimally defined to evaluate regional wall motion. The left ventricular internal cavity size was normal in size. There is moderate concentric left ventricular hypertrophy. Left ventricular diastolic parameters are consistent with Grade II diastolic dysfunction (pseudonormalization).  Right Ventricle: The right ventricular size is mildly enlarged. No  increase in right ventricular wall thickness. Right ventricular systolic function is normal. There is mildly elevated pulmonary artery systolic pressure. The tricuspid regurgitant velocity is 2.90 m/s, and with an assumed right atrial pressure of 3 mmHg, the estimated right ventricular systolic pressure is 36.6 mmHg.  Left Atrium: Left atrial size was severely dilated.  Right Atrium: Right atrial size was normal in size.  Pericardium: There is no evidence of pericardial effusion. Presence of epicardial fat layer.  Mitral Valve: The mitral valve is degenerative in appearance. There is mild calcification of the anterior and posterior mitral valve leaflet(s). Moderate mitral annular calcification. Mild mitral valve regurgitation. No evidence of mitral valve stenosis.  Tricuspid Valve: The tricuspid valve is grossly normal. Tricuspid valve regurgitation is mild . No evidence of tricuspid stenosis.  Aortic Valve: Aortic valve appears bicuspid and is severely calcified. There is severely restricted leaflet mobility. Vmax 2.8 m/s, MG 18 mmHG, AVA 0.95 cm2, DI 0.25. Findings are concerning for severe low flow low gradient aortic stenosis. The aortic valve is bicuspid. There is severe calcifcation of the aortic valve. There is severe thickening of the aortic valve. Aortic valve regurgitation is not visualized. Severe aortic stenosis is present. Aortic valve mean gradient measures 18.0 mmHg. Aortic valve peak gradient measures 32.0 mmHg. Aortic valve  area, by VTI measures 0.95 cm.  Pulmonic Valve: The pulmonic valve was grossly normal. Pulmonic valve regurgitation is trivial. No evidence of pulmonic stenosis.  Aorta: Aortic dilatation noted. There is mild dilatation of the aortic root, measuring 41 mm.  Venous: The inferior vena cava is normal in size with greater than 50% respiratory variability, suggesting right atrial pressure of 3 mmHg.  IAS/Shunts: The atrial septum is grossly normal.   LEFT  VENTRICLE PLAX 2D LVIDd:         5.00 cm   Diastology LVIDs:         4.50 cm   LV e' medial:    3.70 cm/s LV PW:         1.40 cm   LV E/e' medial:  34.3 LV IVS:        1.40 cm   LV e' lateral:   9.03 cm/s LVOT diam:     2.20 cm   LV E/e' lateral: 14.1 LV SV:         56 LV SV Index:   27 LVOT Area:     3.80 cm   RIGHT VENTRICLE RV S prime:     11.40 cm/s TAPSE (M-mode): 2.4 cm  LEFT ATRIUM              Index        RIGHT ATRIUM           Index LA diam:        3.30 cm  1.57 cm/m   RA Area:     18.00 cm LA Vol (A2C):   167.0 ml 79.50 ml/m  RA Volume:   48.80 ml  23.23 ml/m LA Vol (A4C):   76.8 ml  36.56 ml/m LA Biplane Vol: 126.0 ml 59.98 ml/m AORTIC VALVE AV Area (Vmax):    0.92 cm AV Area (Vmean):   0.92 cm AV Area (VTI):     0.95 cm AV Vmax:           283.00 cm/s AV Vmean:          194.500 cm/s AV VTI:            0.590 m AV Peak Grad:      32.0 mmHg AV Mean Grad:      18.0 mmHg LVOT Vmax:         68.50 cm/s LVOT Vmean:        46.900 cm/s LVOT VTI:          0.147 m LVOT/AV VTI ratio: 0.25  AORTA Ao Root diam: 4.10 cm Ao Asc diam:  3.90 cm  MITRAL VALVE                TRICUSPID VALVE MV Area (PHT): 4.36 cm     TR Peak grad:   33.6 mmHg MV Decel Time: 174 msec     TR Vmax:        290.00 cm/s MV E velocity: 127.00 cm/s MV A velocity: 95.20 cm/s   SHUNTS MV E/A ratio:  1.33         Systemic VTI:  0.15 m Systemic Diam: 2.20 cm  Lennie Odor MD Electronically signed by Lennie Odor MD Signature Date/Time: 12/25/2022/5:07:51 PM    Final     CT SCANS  CT CORONARY MORPH W/CTA COR W/SCORE 12/27/2022  Addendum 12/27/2022  4:41 PM ADDENDUM REPORT: 12/27/2022 16:39  EXAM: OVER-READ INTERPRETATION  CT CHEST  The following report is an over-read performed by radiologist Dr. Jacob Moores  [facility] on 12/27/2022. This over-read does not include interpretation of cardiac or coronary anatomy or pathology. The cardiac TAVR interpretation by  the cardiologist is attached.  COMPARISON:  None.  FINDINGS: Extracardiac findings will be described separately under dictation for contemporaneously obtained CTA chest, abdomen and pelvis.  IMPRESSION: Please see separate dictation for contemporaneously obtained CTA chest, abdomen and pelvis dated 12/27/2022 for full description of relevant extracardiac findings.   Electronically Signed By: Allegra Lai M.D. On: 12/27/2022 16:39  Narrative CLINICAL DATA:  36M with severe aortic stenosis being evaluated for a TAVR procedure.  EXAM: Cardiac TAVR CT  TECHNIQUE: The patient was scanned on a [employer]. A 120 kV retrospective scan was triggered in the descending thoracic aorta at 111 HU's. Gantry rotation speed was 250 msecs and collimation was .6 mm. No beta blockade or nitro were given. The 3D data set was reconstructed in 5% intervals of the R-R cycle. Systolic and diastolic phases were analyzed on a dedicated work station using MPR, MIP and VRT modes. The patient received 100 cc of contrast.  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  Aortic valve calcium score: 2573  Aortic annulus:  Diameter: 29mm x 23mm  Perimeter: 81mm  Area: 512 mm^2  Calcifications: Mild calcifications adjacent to left coronary cusp  Coronary height: Min Left - 16mm, Min Right - 19mm  Sinotubular height: Left cusp - 23mm; Right cusp - 25mm; Noncoronary cusp - 27mm  LVOT (as measured 3 mm below the annulus):  Diameter: 28mm x 24mm  Area: 551 mm^2  Calcifications: Mild calcifications inferior to left coronary cusp  Aortic sinus width: Left cusp - 37mm; Right cusp - 35mm; Noncoronary cusp - 40mm  Sinotubular junction width: 38mm x 33mm  Optimum Fluoroscopic Angle for Delivery: LAO 1 CAU 18  Cardiac:  Right atrium: Normal size  Right ventricle: Normal size  Pulmonary arteries: Dilated main pulmonary  artery measuring 30mm  Pulmonary veins: Normal configuration  Left atrium: Mild enlargement  Left ventricle: Normal size  Pericardium: Normal thickness  Coronary arteries: NTG not administered to evaluate coronary arteries. Coronary calcium score 708 (61st percentile)  Aorta: Ascending aortic aneurysm measuring 46mm  IMPRESSION: 1. 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 Severe aortic valve calcifications (AV calcium score 2573)  2. Aortic annulus measures 29mm x 23mm in diameter with perimeter 81mm and area 512 mm^2. Mild annular calcifications adjacent to left coronary cusp and extending into LVOT. Annular measurements are suitable for placement of 26mm Edwards Sapien 3 valve.  3. Sufficient coronary to annulus distance.  4. Optimum Fluoroscopic Angle for Delivery:  LAO 1 CAU 18  5. Ascending aortic aneurysm measuring 46mm  6. Dilated main pulmonary artery measuring 30mm  7. Coronary calcium score 708 (61st percentile)  Electronically Signed: By: Epifanio Lesches M.D. On: 12/27/2022 14:22          Risk Assessment/Calculations:   {Does this patient have ATRIAL FIBRILLATION?[phone]} No BP recorded.  {Refresh Note OR Click here to enter BP  :1}***       Physical Exam:   VS:  There were no vitals taken for this visit.   Wt Readings from Last 3 Encounters:  12/28/22 202 lb 1.6 oz (91.7 kg)  07/01/21 208 lb (94.3 kg)  06/07/21 201 lb 11.5 oz (91.5 kg)    GEN: Well nourished, well developed in no acute distress NECK: No JVD; No carotid bruits  CARDIAC: ***RRR, no murmurs, rubs, gallops RESPIRATORY:  Clear to auscultation without rales, wheezing or rhonchi  ABDOMEN: Soft, non-tender, non-distended EXTREMITIES:  No edema; No deformity   ASSESSMENT AND PLAN: .   ***    {Are you ordering a CV Procedure (e.g. stress test, cath, DCCV, TEE, etc)?   Press F2        :[id number]}  Dispo: ***  Signed, Flossie Dibble, NP

## 2023-01-04 ENCOUNTER — Ambulatory Visit: Payer: Medicare HMO | Admitting: Cardiology

## 2023-01-04 DIAGNOSIS — E785 Hyperlipidemia, unspecified: Secondary | ICD-10-CM

## 2023-01-04 DIAGNOSIS — I10 Essential (primary) hypertension: Secondary | ICD-10-CM

## 2023-01-04 DIAGNOSIS — I350 Nonrheumatic aortic (valve) stenosis: Secondary | ICD-10-CM

## 2023-01-04 DIAGNOSIS — I5020 Unspecified systolic (congestive) heart failure: Secondary | ICD-10-CM

## 2024-01-05 IMAGING — MR MR HEAD W/O CM
10 series · 48 of 48 positions shown · non-contrast
Comparison: Head CT 06/01/2021

CLINICAL DATA: Acute neurologic deficit

EXAM:
MRI HEAD WITHOUT CONTRAST
TECHNIQUE: Multiplanar, multiecho pulse sequences of the brain and surrounding
structures were obtained without intravenous contrast.

[Series 5: dwi_tracew · axial · 3.0mm · 1.08mm/px · z∈[-68,+73]mm · 9 of 102 slices shown]
[im 1/102]
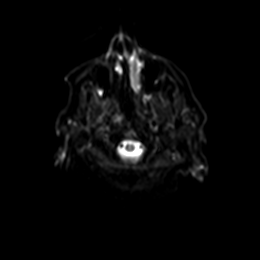
[im 13/102]
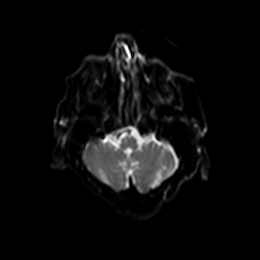
[im 26/102]
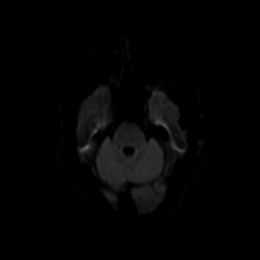
[im 38/102]
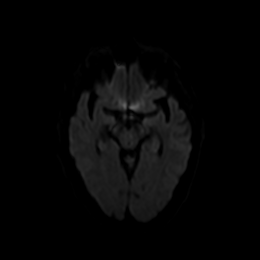
[im 51/102]
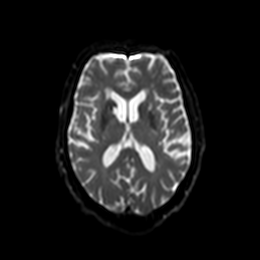
[im 64/102]
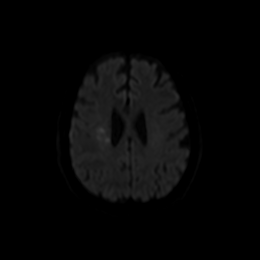
[im 76/102]
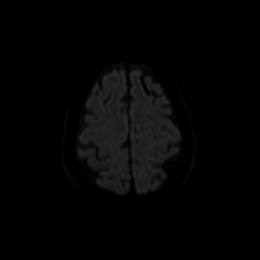
[im 89/102]
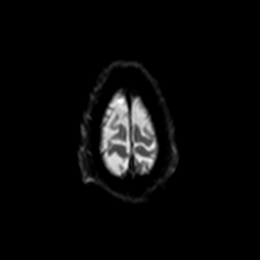
[im 102/102]
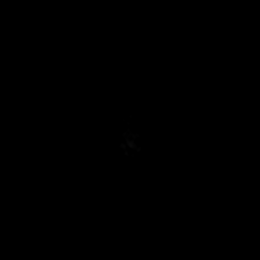

[Series 6: dwi_adc · axial · 3.0mm · 1.08mm/px · z∈[-68,+73]mm · 4 of 51 slices shown]
[im 1/51]
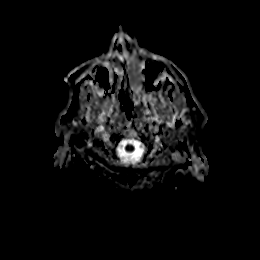
[im 17/51]
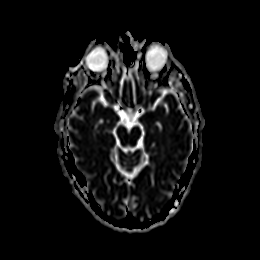
[im 34/51]
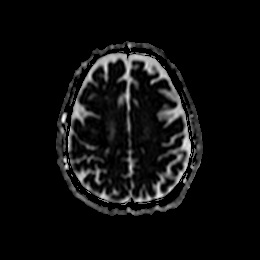
[im 51/51]
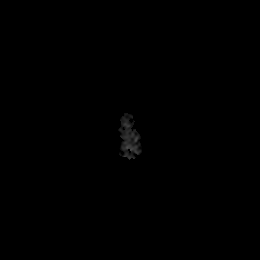

[Series 7: T2 · sagittal · 5.0mm · 0.47mm/px · 2 of 24 slices shown (1 of 3)]
[im 1/24]
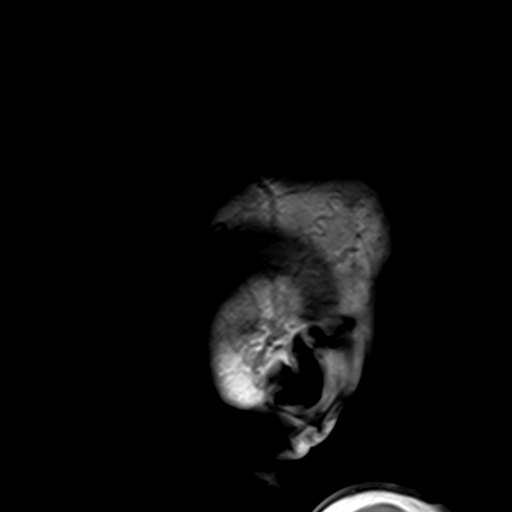
[im 24/24]
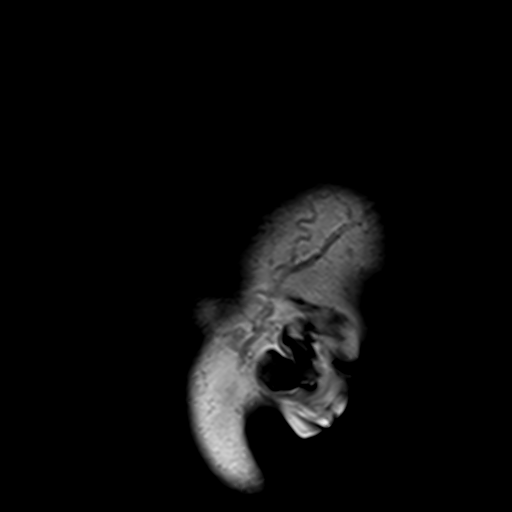

[Series 8: T2 · axial · 5.0mm · 0.45mm/px · z∈[-76,+77]mm · 2 of 26 slices shown (2 of 3)]
[im 1/26]
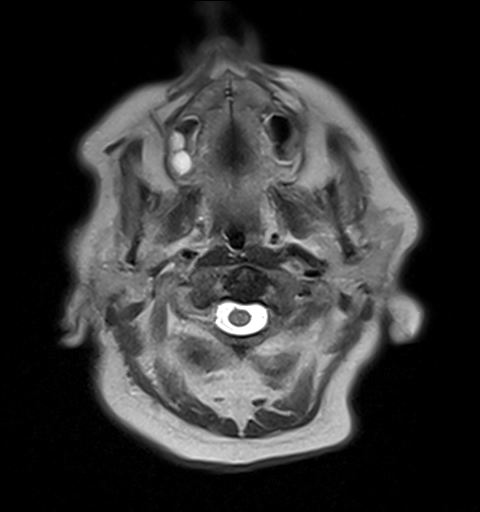
[im 26/26]
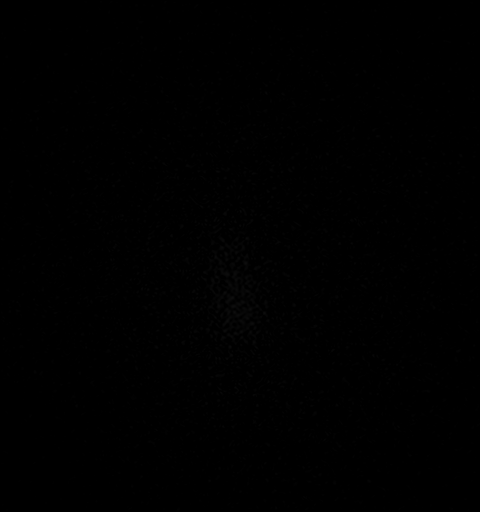

[Series 9: GRE · axial · 3.0mm · 0.45mm/px · z∈[-68,+73]mm · 4 of 51 slices shown]
[im 1/51]
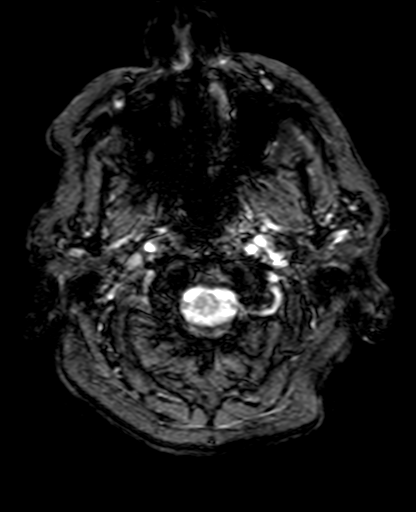
[im 17/51]
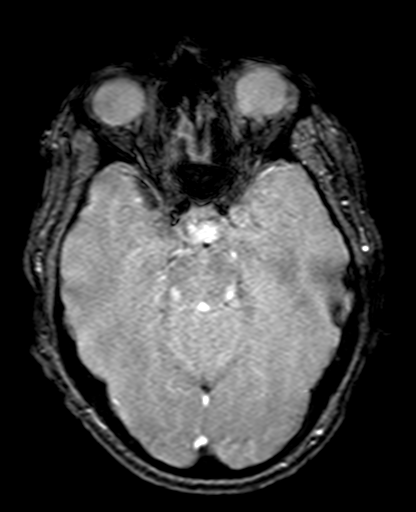
[im 34/51]
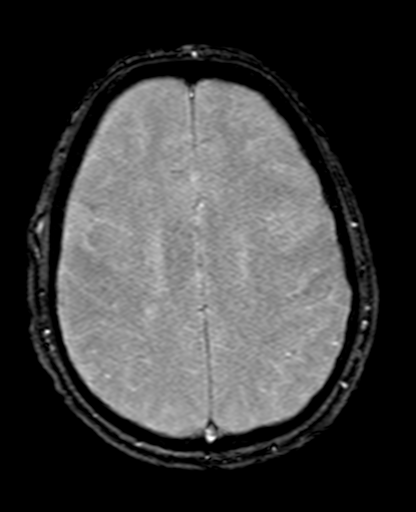
[im 51/51]
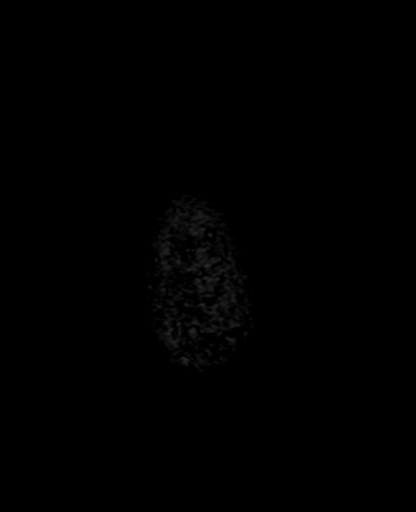

[Series 10: FLAIR · axial · 3.0mm · 0.90mm/px · z∈[-68,+74]mm · 4 of 51 slices shown]
[im 1/51]
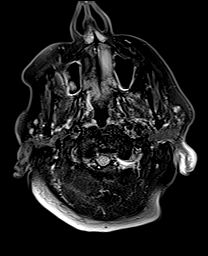
[im 17/51]
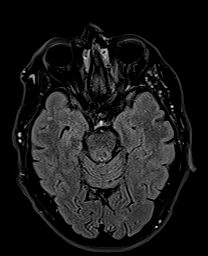
[im 34/51]
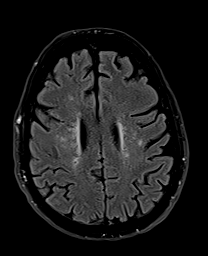
[im 51/51]
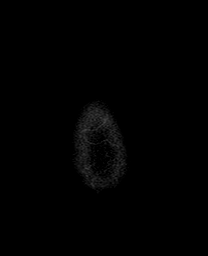

[Series 11: DWI · coronal · 5.0mm · 1.31mm/px · 5 of 64 slices shown (1 of 2)]
[im 1/64]
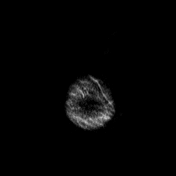
[im 16/64]
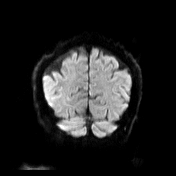
[im 32/64]
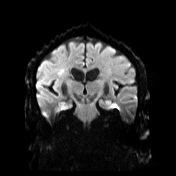
[im 48/64]
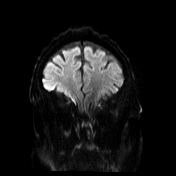
[im 64/64]
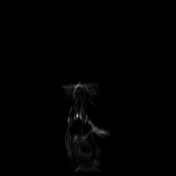

[Series 12: DWI · coronal · 5.0mm · 1.31mm/px · 3 of 32 slices shown (2 of 2)]
[im 1/32]
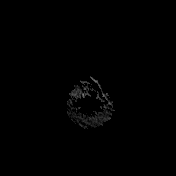
[im 16/32]
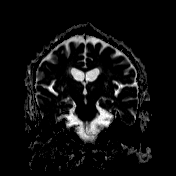
[im 32/32]
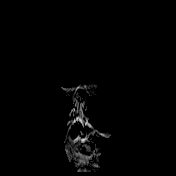

[Series 13: T2 · coronal · 5.0mm · 0.86mm/px · 3 of 32 slices shown (3 of 3)]
[im 1/32]
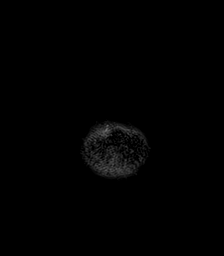
[im 16/32]
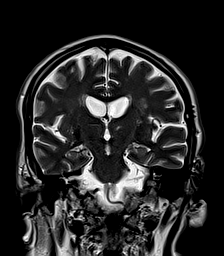
[im 32/32]
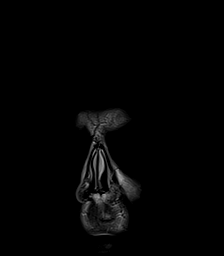

[Series 14: T1 · axial · 1.0mm · 0.94mm/px · z∈[-64,+71]mm · 12 of 144 slices shown]
[im 1/144]
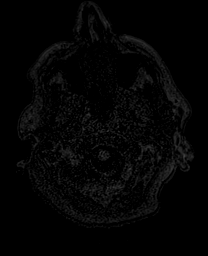
[im 14/144]
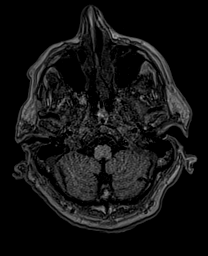
[im 27/144]
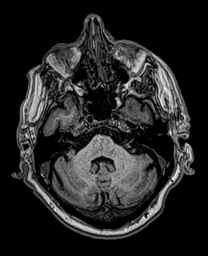
[im 40/144]
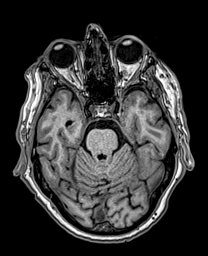
[im 53/144]
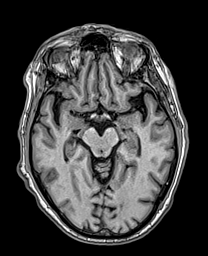
[im 66/144]
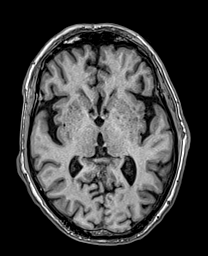
[im 79/144]
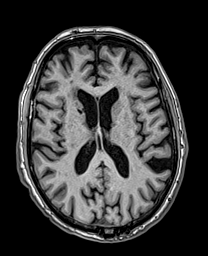
[im 92/144]
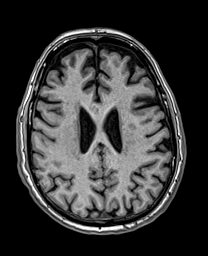
[im 105/144]
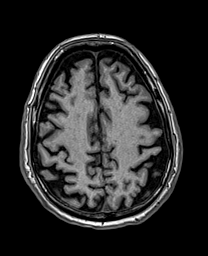
[im 118/144]
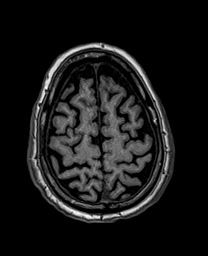
[im 131/144]
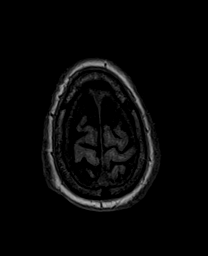
[im 144/144]
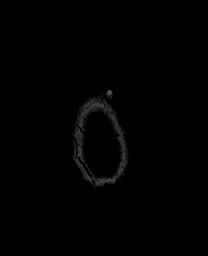

[48 of 48 positions shown; findings below may reference images not displayed]

FINDINGS: Brain: Small area of abnormal diffusion restriction in the right
centrum semiovale. No acute or chronic hemorrhage. There is
multifocal hyperintense T2-weighted signal within the white matter.
Generalized volume loss without a clear lobar predilection. The
midline structures are normal.

Vascular: Major flow voids are preserved.

Skull and upper cervical spine: Normal calvarium and skull base.
Visualized upper cervical spine and soft tissues are normal.

Sinuses/Orbits:No paranasal sinus fluid levels or advanced mucosal
thickening. No mastoid or middle ear effusion. Normal orbits.
IMPRESSION: 1. Small area of acute/subacute ischemia in the right centrum
semiovale. No hemorrhage or mass effect.
2. Findings of chronic microvascular ischemia and generalized volume
loss.
